# Patient Record
Sex: Male | Born: 1950 | Race: White | Hispanic: No | Marital: Married | Smoking: Never smoker
Health system: Southern US, Community
[De-identification: ages and names within clinical notes are randomized; demographics above are authoritative.]

## PROBLEM LIST (undated history)

## (undated) DIAGNOSIS — F1011 Alcohol abuse, in remission: Secondary | ICD-10-CM

## (undated) DIAGNOSIS — K3184 Gastroparesis: Secondary | ICD-10-CM

## (undated) DIAGNOSIS — K219 Gastro-esophageal reflux disease without esophagitis: Secondary | ICD-10-CM

## (undated) DIAGNOSIS — K7460 Unspecified cirrhosis of liver: Secondary | ICD-10-CM

## (undated) DIAGNOSIS — I8500 Esophageal varices without bleeding: Secondary | ICD-10-CM

## (undated) DIAGNOSIS — N289 Disorder of kidney and ureter, unspecified: Secondary | ICD-10-CM

## (undated) DIAGNOSIS — C189 Malignant neoplasm of colon, unspecified: Secondary | ICD-10-CM

## (undated) DIAGNOSIS — Z9889 Other specified postprocedural states: Secondary | ICD-10-CM

## (undated) DIAGNOSIS — K222 Esophageal obstruction: Secondary | ICD-10-CM

## (undated) DIAGNOSIS — N179 Acute kidney failure, unspecified: Secondary | ICD-10-CM

## (undated) HISTORY — DX: Malignant neoplasm of colon, unspecified: C18.9

## (undated) HISTORY — DX: Alcohol abuse, in remission: F10.11

## (undated) HISTORY — DX: Esophageal varices without bleeding: I85.00

## (undated) HISTORY — PX: COLOSTOMY: SHX63

## (undated) HISTORY — DX: Esophageal obstruction: K22.2

## (undated) HISTORY — PX: OTHER SURGICAL HISTORY: SHX169

## (undated) HISTORY — DX: Unspecified cirrhosis of liver: K74.60

## (undated) HISTORY — DX: Gastroparesis: K31.84

## (undated) HISTORY — DX: Acute kidney failure, unspecified: N17.9

## (undated) HISTORY — DX: Gastro-esophageal reflux disease without esophagitis: K21.9

## (undated) HISTORY — DX: Disorder of kidney and ureter, unspecified: N28.9

## (undated) HISTORY — DX: Other specified postprocedural states: Z98.890

---

## 2010-01-19 DIAGNOSIS — Z9889 Other specified postprocedural states: Secondary | ICD-10-CM

## 2010-01-19 DIAGNOSIS — C189 Malignant neoplasm of colon, unspecified: Secondary | ICD-10-CM

## 2010-01-19 HISTORY — DX: Malignant neoplasm of colon, unspecified: C18.9

## 2010-01-19 HISTORY — DX: Other specified postprocedural states: Z98.890

## 2010-01-19 HISTORY — PX: ESOPHAGOGASTRODUODENOSCOPY: SHX1529

## 2010-01-19 HISTORY — PX: COLONOSCOPY: SHX174

## 2010-01-29 ENCOUNTER — Emergency Department (HOSPITAL_COMMUNITY)
Admission: EM | Admit: 2010-01-29 | Discharge: 2010-01-29 | Payer: Self-pay | Source: Home / Self Care | Admitting: Emergency Medicine

## 2010-01-30 ENCOUNTER — Ambulatory Visit (HOSPITAL_COMMUNITY)
Admission: RE | Admit: 2010-01-30 | Discharge: 2010-01-30 | Payer: Self-pay | Source: Home / Self Care | Attending: Family Medicine | Admitting: Family Medicine

## 2010-02-02 ENCOUNTER — Ambulatory Visit: Payer: Self-pay | Admitting: Internal Medicine

## 2010-02-02 DIAGNOSIS — R1013 Epigastric pain: Secondary | ICD-10-CM | POA: Insufficient documentation

## 2010-02-02 DIAGNOSIS — R740 Nonspecific elevation of levels of transaminase and lactic acid dehydrogenase [LDH]: Secondary | ICD-10-CM

## 2010-02-02 DIAGNOSIS — K7030 Alcoholic cirrhosis of liver without ascites: Secondary | ICD-10-CM

## 2010-02-03 ENCOUNTER — Encounter: Payer: Self-pay | Admitting: Gastroenterology

## 2010-02-07 ENCOUNTER — Encounter: Payer: Self-pay | Admitting: Internal Medicine

## 2010-02-07 ENCOUNTER — Ambulatory Visit (HOSPITAL_COMMUNITY)
Admission: RE | Admit: 2010-02-07 | Discharge: 2010-02-07 | Payer: Self-pay | Source: Home / Self Care | Attending: Internal Medicine | Admitting: Internal Medicine

## 2010-02-08 ENCOUNTER — Ambulatory Visit: Payer: Self-pay | Admitting: Gastroenterology

## 2010-02-09 ENCOUNTER — Encounter: Payer: Self-pay | Admitting: Internal Medicine

## 2010-02-09 ENCOUNTER — Encounter: Payer: Self-pay | Admitting: Gastroenterology

## 2010-02-09 LAB — CONVERTED CEMR LAB
AFP-Tumor Marker: 9.5 ng/mL — ABNORMAL HIGH (ref 0.0–8.0)
Alkaline Phosphatase: 98 units/L (ref 39–117)
HCV Ab: NEGATIVE
INR: 1.1 (ref <1.50)
Indirect Bilirubin: 1 mg/dL — ABNORMAL HIGH (ref 0.0–0.9)
Prothrombin Time: 14.4 s (ref 11.6–15.2)
Total Bilirubin: 1.6 mg/dL — ABNORMAL HIGH (ref 0.3–1.2)
Total Protein: 6.7 g/dL (ref 6.0–8.3)

## 2010-02-15 ENCOUNTER — Encounter (INDEPENDENT_AMBULATORY_CARE_PROVIDER_SITE_OTHER): Payer: Self-pay | Admitting: *Deleted

## 2010-02-22 ENCOUNTER — Encounter (INDEPENDENT_AMBULATORY_CARE_PROVIDER_SITE_OTHER): Payer: Self-pay | Admitting: *Deleted

## 2010-02-24 ENCOUNTER — Inpatient Hospital Stay (HOSPITAL_COMMUNITY)
Admission: RE | Admit: 2010-02-24 | Discharge: 2010-03-01 | Payer: Self-pay | Source: Home / Self Care | Attending: General Surgery | Admitting: General Surgery

## 2010-02-24 ENCOUNTER — Encounter (INDEPENDENT_AMBULATORY_CARE_PROVIDER_SITE_OTHER): Payer: Self-pay | Admitting: General Surgery

## 2010-02-24 HISTORY — PX: COLON SURGERY: SHX602

## 2010-02-24 LAB — HEMOGLOBIN AND HEMATOCRIT, BLOOD
HCT: 38.4 % — ABNORMAL LOW (ref 39.0–52.0)
Hemoglobin: 13.2 g/dL (ref 13.0–17.0)

## 2010-03-03 ENCOUNTER — Inpatient Hospital Stay (HOSPITAL_COMMUNITY)
Admission: EM | Admit: 2010-03-03 | Discharge: 2010-04-07 | DRG: 329 | Disposition: A | Discharge status: Home Health | Payer: Medicaid Other | Attending: General Surgery | Admitting: General Surgery

## 2010-03-03 DIAGNOSIS — Y836 Removal of other organ (partial) (total) as the cause of abnormal reaction of the patient, or of later complication, without mention of misadventure at the time of the procedure: Secondary | ICD-10-CM | POA: Diagnosis present

## 2010-03-03 DIAGNOSIS — K7030 Alcoholic cirrhosis of liver without ascites: Secondary | ICD-10-CM | POA: Diagnosis present

## 2010-03-03 DIAGNOSIS — D631 Anemia in chronic kidney disease: Secondary | ICD-10-CM | POA: Diagnosis present

## 2010-03-03 DIAGNOSIS — E46 Unspecified protein-calorie malnutrition: Secondary | ICD-10-CM | POA: Diagnosis not present

## 2010-03-03 DIAGNOSIS — A419 Sepsis, unspecified organism: Secondary | ICD-10-CM | POA: Diagnosis present

## 2010-03-03 DIAGNOSIS — N179 Acute kidney failure, unspecified: Secondary | ICD-10-CM

## 2010-03-03 DIAGNOSIS — K632 Fistula of intestine: Secondary | ICD-10-CM | POA: Diagnosis not present

## 2010-03-03 DIAGNOSIS — J9600 Acute respiratory failure, unspecified whether with hypoxia or hypercapnia: Secondary | ICD-10-CM | POA: Diagnosis not present

## 2010-03-03 DIAGNOSIS — K929 Disease of digestive system, unspecified: Principal | ICD-10-CM | POA: Diagnosis present

## 2010-03-03 DIAGNOSIS — E876 Hypokalemia: Secondary | ICD-10-CM | POA: Diagnosis present

## 2010-03-03 DIAGNOSIS — F1020 Alcohol dependence, uncomplicated: Secondary | ICD-10-CM | POA: Diagnosis present

## 2010-03-03 HISTORY — PX: EXPLORATORY LAPAROTOMY: SUR591

## 2010-03-06 LAB — BASIC METABOLIC PANEL
BUN: 9 mg/dL (ref 6–23)
BUN: 12 mg/dL (ref 6–23)
BUN: 13 mg/dL (ref 6–23)
BUN: 16 mg/dL (ref 6–23)
BUN: 11 mg/dL (ref 6–23)
BUN: 37 mg/dL — ABNORMAL HIGH (ref 6–23)
BUN: 60 mg/dL — ABNORMAL HIGH (ref 6–23)
CO2: 28 mEq/L (ref 19–32)
CO2: 29 mEq/L (ref 19–32)
CO2: 31 mEq/L (ref 19–32)
CO2: 28 mEq/L (ref 19–32)
CO2: 26 mEq/L (ref 19–32)
CO2: 17 mEq/L — ABNORMAL LOW (ref 19–32)
CO2: 16 mEq/L — ABNORMAL LOW (ref 19–32)
Calcium: 8.1 mg/dL — ABNORMAL LOW (ref 8.4–10.5)
Calcium: 8.1 mg/dL — ABNORMAL LOW (ref 8.4–10.5)
Calcium: 8.3 mg/dL — ABNORMAL LOW (ref 8.4–10.5)
Calcium: 8 mg/dL — ABNORMAL LOW (ref 8.4–10.5)
Calcium: 9.1 mg/dL (ref 8.4–10.5)
Calcium: 7.9 mg/dL — ABNORMAL LOW (ref 8.4–10.5)
Calcium: 7.7 mg/dL — ABNORMAL LOW (ref 8.4–10.5)
Chloride: 98 mEq/L (ref 96–112)
Chloride: 102 mEq/L (ref 96–112)
Chloride: 104 mEq/L (ref 96–112)
Chloride: 107 mEq/L (ref 96–112)
Chloride: 108 mEq/L (ref 96–112)
Chloride: 113 mEq/L — ABNORMAL HIGH (ref 96–112)
Chloride: 108 mEq/L (ref 96–112)
Creatinine, Ser: 1.17 mg/dL (ref 0.4–1.5)
Creatinine, Ser: 0.93 mg/dL (ref 0.4–1.5)
Creatinine, Ser: 0.95 mg/dL (ref 0.4–1.5)
Creatinine, Ser: 0.85 mg/dL (ref 0.4–1.5)
Creatinine, Ser: 0.91 mg/dL (ref 0.4–1.5)
Creatinine, Ser: 3.56 mg/dL — ABNORMAL HIGH (ref 0.4–1.5)
Creatinine, Ser: 5.01 mg/dL — ABNORMAL HIGH (ref 0.4–1.5)
GFR calc Af Amer: >60 mL/min (ref >60)
GFR calc Af Amer: >60 mL/min (ref >60)
GFR calc Af Amer: >60 mL/min (ref >60)
GFR calc Af Amer: >60 mL/min (ref >60)
GFR calc Af Amer: >60 mL/min (ref >60)
GFR calc Af Amer: 22 mL/min — ABNORMAL LOW (ref >60)
GFR calc Af Amer: 15 mL/min — ABNORMAL LOW (ref >60)
GFR calc non Af Amer: >60 mL/min (ref >60)
GFR calc non Af Amer: >60 mL/min (ref >60)
GFR calc non Af Amer: >60 mL/min (ref >60)
GFR calc non Af Amer: >60 mL/min (ref >60)
GFR calc non Af Amer: >60 mL/min (ref >60)
GFR calc non Af Amer: 18 mL/min — ABNORMAL LOW (ref >60)
GFR calc non Af Amer: 12 mL/min — ABNORMAL LOW (ref >60)
Glucose, Bld: 159 mg/dL — ABNORMAL HIGH (ref 70–99)
Glucose, Bld: 151 mg/dL — ABNORMAL HIGH (ref 70–99)
Glucose, Bld: 114 mg/dL — ABNORMAL HIGH (ref 70–99)
Glucose, Bld: 88 mg/dL (ref 70–99)
Glucose, Bld: 94 mg/dL (ref 70–99)
Glucose, Bld: 92 mg/dL (ref 70–99)
Glucose, Bld: 101 mg/dL — ABNORMAL HIGH (ref 70–99)
Potassium: 5.4 mEq/L — ABNORMAL HIGH (ref 3.5–5.1)
Potassium: 3.8 mEq/L (ref 3.5–5.1)
Potassium: 3.5 mEq/L (ref 3.5–5.1)
Potassium: 2.9 mEq/L — ABNORMAL LOW (ref 3.5–5.1)
Potassium: 3.7 mEq/L (ref 3.5–5.1)
Potassium: 2.7 mEq/L — CL (ref 3.5–5.1)
Potassium: 3.1 mEq/L — ABNORMAL LOW (ref 3.5–5.1)
Sodium: 136 mEq/L (ref 135–145)
Sodium: 137 mEq/L (ref 135–145)
Sodium: 141 mEq/L (ref 135–145)
Sodium: 141 mEq/L (ref 135–145)
Sodium: 139 mEq/L (ref 135–145)
Sodium: 138 mEq/L (ref 135–145)
Sodium: 135 mEq/L (ref 135–145)

## 2010-03-06 LAB — GLUCOSE, CAPILLARY: Glucose-Capillary: 164 mg/dL — ABNORMAL HIGH (ref 70–99)

## 2010-03-06 LAB — COMPREHENSIVE METABOLIC PANEL
ALT: 20 U/L (ref 0–53)
ALT: 21 U/L (ref 0–53)
AST: 28 U/L (ref 0–37)
AST: 37 U/L (ref 0–37)
AST: 31 U/L (ref 0–37)
Albumin: 2.8 g/dL — ABNORMAL LOW (ref 3.5–5.2)
Albumin: 1.7 g/dL — ABNORMAL LOW (ref 3.5–5.2)
Albumin: 1.4 g/dL — ABNORMAL LOW (ref 3.5–5.2)
Alkaline Phosphatase: 66 U/L (ref 39–117)
Alkaline Phosphatase: 52 U/L (ref 39–117)
Alkaline Phosphatase: 108 U/L (ref 39–117)
BUN: 31 mg/dL — ABNORMAL HIGH (ref 6–23)
BUN: 30 mg/dL — ABNORMAL HIGH (ref 6–23)
BUN: 72 mg/dL — ABNORMAL HIGH (ref 6–23)
CO2: 13 mEq/L — ABNORMAL LOW (ref 19–32)
CO2: 16 mEq/L — ABNORMAL LOW (ref 19–32)
CO2: 18 mEq/L — ABNORMAL LOW (ref 19–32)
Calcium: 10.4 mg/dL (ref 8.4–10.5)
Calcium: 8.5 mg/dL (ref 8.4–10.5)
Calcium: 7.8 mg/dL — ABNORMAL LOW (ref 8.4–10.5)
Chloride: 109 mEq/L (ref 96–112)
Chloride: 108 mEq/L (ref 96–112)
Chloride: 110 mEq/L (ref 96–112)
Creatinine, Ser: 3.59 mg/dL — ABNORMAL HIGH (ref 0.4–1.5)
Creatinine, Ser: 2.7 mg/dL — ABNORMAL HIGH (ref 0.4–1.5)
GFR calc Af Amer: 21 mL/min — ABNORMAL LOW (ref >60)
GFR calc Af Amer: 30 mL/min — ABNORMAL LOW (ref >60)
GFR calc Af Amer: 13 mL/min — ABNORMAL LOW (ref >60)
GFR calc non Af Amer: 18 mL/min — ABNORMAL LOW (ref >60)
GFR calc non Af Amer: 24 mL/min — ABNORMAL LOW (ref >60)
GFR calc non Af Amer: 11 mL/min — ABNORMAL LOW (ref >60)
Glucose, Bld: 144 mg/dL — ABNORMAL HIGH (ref 70–99)
Glucose, Bld: 90 mg/dL (ref 70–99)
Glucose, Bld: 95 mg/dL (ref 70–99)
Potassium: 3.1 mEq/L — ABNORMAL LOW (ref 3.5–5.1)
Potassium: 3 mEq/L — ABNORMAL LOW (ref 3.5–5.1)
Potassium: 3.6 mEq/L (ref 3.5–5.1)
Sodium: 136 mEq/L (ref 135–145)
Sodium: 135 mEq/L (ref 135–145)
Sodium: 136 mEq/L (ref 135–145)
Total Bilirubin: 1.4 mg/dL — ABNORMAL HIGH (ref 0.3–1.2)
Total Bilirubin: 1.4 mg/dL — ABNORMAL HIGH (ref 0.3–1.2)
Total Bilirubin: 1.6 mg/dL — ABNORMAL HIGH (ref 0.3–1.2)
Total Protein: 6.4 g/dL (ref 6.0–8.3)
Total Protein: 3.9 g/dL — ABNORMAL LOW (ref 6.0–8.3)
Total Protein: 4.1 g/dL — ABNORMAL LOW (ref 6.0–8.3)

## 2010-03-06 LAB — CBC
HCT: 38.6 % — ABNORMAL LOW (ref 39.0–52.0)
HCT: 35.2 % — ABNORMAL LOW (ref 39.0–52.0)
HCT: 32.1 % — ABNORMAL LOW (ref 39.0–52.0)
HCT: 28.7 % — ABNORMAL LOW (ref 39.0–52.0)
HCT: 38.7 % — ABNORMAL LOW (ref 39.0–52.0)
HCT: 30 % — ABNORMAL LOW (ref 39.0–52.0)
HCT: 27.8 % — ABNORMAL LOW (ref 39.0–52.0)
HCT: 22.6 % — ABNORMAL LOW (ref 39.0–52.0)
HCT: 27.4 % — ABNORMAL LOW (ref 39.0–52.0)
Hemoglobin: 13.3 g/dL (ref 13.0–17.0)
Hemoglobin: 11.9 g/dL — ABNORMAL LOW (ref 13.0–17.0)
Hemoglobin: 10.9 g/dL — ABNORMAL LOW (ref 13.0–17.0)
Hemoglobin: 9.9 g/dL — ABNORMAL LOW (ref 13.0–17.0)
Hemoglobin: 13.4 g/dL (ref 13.0–17.0)
Hemoglobin: 10.8 g/dL — ABNORMAL LOW (ref 13.0–17.0)
Hemoglobin: 10.3 g/dL — ABNORMAL LOW (ref 13.0–17.0)
Hemoglobin: 8.2 g/dL — ABNORMAL LOW (ref 13.0–17.0)
Hemoglobin: 9.9 g/dL — ABNORMAL LOW (ref 13.0–17.0)
MCH: 35 pg — ABNORMAL HIGH (ref 26.0–34.0)
MCH: 34.2 pg — ABNORMAL HIGH (ref 26.0–34.0)
MCH: 34.2 pg — ABNORMAL HIGH (ref 26.0–34.0)
MCH: 34.3 pg — ABNORMAL HIGH (ref 26.0–34.0)
MCH: 33.5 pg (ref 26.0–34.0)
MCH: 34.4 pg — ABNORMAL HIGH (ref 26.0–34.0)
MCH: 34.7 pg — ABNORMAL HIGH (ref 26.0–34.0)
MCH: 34 pg (ref 26.0–34.0)
MCH: 33.2 pg (ref 26.0–34.0)
MCHC: 34.5 g/dL (ref 30.0–36.0)
MCHC: 33.8 g/dL (ref 30.0–36.0)
MCHC: 34 g/dL (ref 30.0–36.0)
MCHC: 34.5 g/dL (ref 30.0–36.0)
MCHC: 34.6 g/dL (ref 30.0–36.0)
MCHC: 36 g/dL (ref 30.0–36.0)
MCHC: 37.1 g/dL — ABNORMAL HIGH (ref 30.0–36.0)
MCHC: 36.1 g/dL — ABNORMAL HIGH (ref 30.0–36.0)
MCV: 101.6 fL — ABNORMAL HIGH (ref 78.0–100.0)
MCV: 101.1 fL — ABNORMAL HIGH (ref 78.0–100.0)
MCV: 100.6 fL — ABNORMAL HIGH (ref 78.0–100.0)
MCV: 99.3 fL (ref 78.0–100.0)
MCV: 96.8 fL (ref 78.0–100.0)
MCV: 95.5 fL (ref 78.0–100.0)
MCV: 93.6 fL (ref 78.0–100.0)
MCV: 93.8 fL (ref 78.0–100.0)
MCV: 91.9 fL (ref 78.0–100.0)
Platelets: 179 10*3/uL (ref 150–400)
Platelets: 130 10*3/uL — ABNORMAL LOW (ref 150–400)
Platelets: 112 10*3/uL — ABNORMAL LOW (ref 150–400)
Platelets: 112 10*3/uL — ABNORMAL LOW (ref 150–400)
Platelets: 401 10*3/uL — ABNORMAL HIGH (ref 150–400)
Platelets: 264 10*3/uL (ref 150–400)
Platelets: 316 10*3/uL (ref 150–400)
Platelets: 195 10*3/uL (ref 150–400)
RBC: 3.8 MIL/uL — ABNORMAL LOW (ref 4.22–5.81)
RBC: 3.48 MIL/uL — ABNORMAL LOW (ref 4.22–5.81)
RBC: 3.19 MIL/uL — ABNORMAL LOW (ref 4.22–5.81)
RBC: 2.89 MIL/uL — ABNORMAL LOW (ref 4.22–5.81)
RBC: 4 MIL/uL — ABNORMAL LOW (ref 4.22–5.81)
RBC: 3.14 MIL/uL — ABNORMAL LOW (ref 4.22–5.81)
RBC: 2.97 MIL/uL — ABNORMAL LOW (ref 4.22–5.81)
RBC: 2.41 MIL/uL — ABNORMAL LOW (ref 4.22–5.81)
RBC: 2.98 MIL/uL — ABNORMAL LOW (ref 4.22–5.81)
RDW: 16.7 % — ABNORMAL HIGH (ref 11.5–15.5)
RDW: 16.4 % — ABNORMAL HIGH (ref 11.5–15.5)
RDW: 16.4 % — ABNORMAL HIGH (ref 11.5–15.5)
RDW: 15.9 % — ABNORMAL HIGH (ref 11.5–15.5)
RDW: 16.1 % — ABNORMAL HIGH (ref 11.5–15.5)
RDW: 16.1 % — ABNORMAL HIGH (ref 11.5–15.5)
RDW: 16.1 % — ABNORMAL HIGH (ref 11.5–15.5)
RDW: 16.8 % — ABNORMAL HIGH (ref 11.5–15.5)
WBC: 18 10*3/uL — ABNORMAL HIGH (ref 4.0–10.5)
WBC: 12.5 10*3/uL — ABNORMAL HIGH (ref 4.0–10.5)
WBC: 3.5 10*3/uL — ABNORMAL LOW (ref 4.0–10.5)
WBC: 3.5 10*3/uL — ABNORMAL LOW (ref 4.0–10.5)
WBC: 25.2 10*3/uL — ABNORMAL HIGH (ref 4.0–10.5)
WBC: 11 10*3/uL — ABNORMAL HIGH (ref 4.0–10.5)
WBC: 21.6 10*3/uL — ABNORMAL HIGH (ref 4.0–10.5)
WBC: 13.8 10*3/uL — ABNORMAL HIGH (ref 4.0–10.5)
WBC: 13.7 10*3/uL — ABNORMAL HIGH (ref 4.0–10.5)

## 2010-03-06 LAB — DIFFERENTIAL
Basophils Absolute: 0 10*3/uL (ref 0.0–0.1)
Basophils Absolute: 0 10*3/uL (ref 0.0–0.1)
Basophils Absolute: 0 10*3/uL (ref 0.0–0.1)
Basophils Absolute: 0 10*3/uL (ref 0.0–0.1)
Basophils Absolute: 0 10*3/uL (ref 0.0–0.1)
Basophils Absolute: 0 10*3/uL (ref 0.0–0.1)
Basophils Absolute: 0 10*3/uL (ref 0.0–0.1)
Basophils Absolute: 0 10*3/uL (ref 0.0–0.1)
Basophils Absolute: 0 10*3/uL (ref 0.0–0.1)
Basophils Relative: 0 % (ref 0–1)
Basophils Relative: 0 % (ref 0–1)
Basophils Relative: 0 % (ref 0–1)
Basophils Relative: 0 % (ref 0–1)
Basophils Relative: 0 % (ref 0–1)
Basophils Relative: 0 % (ref 0–1)
Basophils Relative: 0 % (ref 0–1)
Basophils Relative: 0 % (ref 0–1)
Basophils Relative: 0 % (ref 0–1)
Eosinophils Absolute: 0 10*3/uL (ref 0.0–0.7)
Eosinophils Absolute: 0 10*3/uL (ref 0.0–0.7)
Eosinophils Absolute: 0 10*3/uL (ref 0.0–0.7)
Eosinophils Absolute: 0 10*3/uL (ref 0.0–0.7)
Eosinophils Absolute: 0 10*3/uL (ref 0.0–0.7)
Eosinophils Absolute: 0 10*3/uL (ref 0.0–0.7)
Eosinophils Absolute: 0 10*3/uL (ref 0.0–0.7)
Eosinophils Absolute: 0 10*3/uL (ref 0.0–0.7)
Eosinophils Absolute: 0 10*3/uL (ref 0.0–0.7)
Eosinophils Relative: 0 % (ref 0–5)
Eosinophils Relative: 0 % (ref 0–5)
Eosinophils Relative: 0 % (ref 0–5)
Eosinophils Relative: 1 % (ref 0–5)
Eosinophils Relative: 0 % (ref 0–5)
Eosinophils Relative: 0 % (ref 0–5)
Eosinophils Relative: 0 % (ref 0–5)
Eosinophils Relative: 0 % (ref 0–5)
Eosinophils Relative: 0 % (ref 0–5)
Lymphocytes Relative: 7 % — ABNORMAL LOW (ref 12–46)
Lymphocytes Relative: 5 % — ABNORMAL LOW (ref 12–46)
Lymphocytes Relative: 12 % (ref 12–46)
Lymphocytes Relative: 19 % (ref 12–46)
Lymphocytes Relative: 4 % — ABNORMAL LOW (ref 12–46)
Lymphocytes Relative: 8 % — ABNORMAL LOW (ref 12–46)
Lymphocytes Relative: 5 % — ABNORMAL LOW (ref 12–46)
Lymphocytes Relative: 5 % — ABNORMAL LOW (ref 12–46)
Lymphocytes Relative: 4 % — ABNORMAL LOW (ref 12–46)
Lymphs Abs: 1.2 10*3/uL (ref 0.7–4.0)
Lymphs Abs: 0.7 10*3/uL (ref 0.7–4.0)
Lymphs Abs: 0.4 10*3/uL — ABNORMAL LOW (ref 0.7–4.0)
Lymphs Abs: 0.7 10*3/uL (ref 0.7–4.0)
Lymphs Abs: 1 10*3/uL (ref 0.7–4.0)
Lymphs Abs: 0.9 10*3/uL (ref 0.7–4.0)
Lymphs Abs: 1 10*3/uL (ref 0.7–4.0)
Lymphs Abs: 0.8 10*3/uL (ref 0.7–4.0)
Lymphs Abs: 0.5 10*3/uL — ABNORMAL LOW (ref 0.7–4.0)
Monocytes Absolute: 1.2 10*3/uL — ABNORMAL HIGH (ref 0.1–1.0)
Monocytes Absolute: 0.9 10*3/uL (ref 0.1–1.0)
Monocytes Absolute: 0.6 10*3/uL (ref 0.1–1.0)
Monocytes Absolute: 0.6 10*3/uL (ref 0.1–1.0)
Monocytes Absolute: 1.5 10*3/uL — ABNORMAL HIGH (ref 0.1–1.0)
Monocytes Absolute: 0.3 10*3/uL (ref 0.1–1.0)
Monocytes Absolute: 0.9 10*3/uL (ref 0.1–1.0)
Monocytes Absolute: 0.5 10*3/uL (ref 0.1–1.0)
Monocytes Absolute: 0.6 10*3/uL (ref 0.1–1.0)
Monocytes Relative: 7 % (ref 3–12)
Monocytes Relative: 7 % (ref 3–12)
Monocytes Relative: 16 % — ABNORMAL HIGH (ref 3–12)
Monocytes Relative: 16 % — ABNORMAL HIGH (ref 3–12)
Monocytes Relative: 6 % (ref 3–12)
Monocytes Relative: 2 % — ABNORMAL LOW (ref 3–12)
Monocytes Relative: 4 % (ref 3–12)
Monocytes Relative: 4 % (ref 3–12)
Monocytes Relative: 4 % (ref 3–12)
Neutro Abs: 15.6 10*3/uL — ABNORMAL HIGH (ref 1.7–7.7)
Neutro Abs: 10.9 10*3/uL — ABNORMAL HIGH (ref 1.7–7.7)
Neutro Abs: 2.5 10*3/uL (ref 1.7–7.7)
Neutro Abs: 2.3 10*3/uL (ref 1.7–7.7)
Neutro Abs: 22.7 10*3/uL — ABNORMAL HIGH (ref 1.7–7.7)
Neutro Abs: 9.8 10*3/uL — ABNORMAL HIGH (ref 1.7–7.7)
Neutro Abs: 19.7 10*3/uL — ABNORMAL HIGH (ref 1.7–7.7)
Neutro Abs: 12.5 10*3/uL — ABNORMAL HIGH (ref 1.7–7.7)
Neutro Abs: 12.7 10*3/uL — ABNORMAL HIGH (ref 1.7–7.7)
Neutrophils Relative %: 87 % — ABNORMAL HIGH (ref 43–77)
Neutrophils Relative %: 87 % — ABNORMAL HIGH (ref 43–77)
Neutrophils Relative %: 72 % (ref 43–77)
Neutrophils Relative %: 65 % (ref 43–77)
Neutrophils Relative %: 90 % — ABNORMAL HIGH (ref 43–77)
Neutrophils Relative %: 89 % — ABNORMAL HIGH (ref 43–77)
Neutrophils Relative %: 91 % — ABNORMAL HIGH (ref 43–77)
Neutrophils Relative %: 91 % — ABNORMAL HIGH (ref 43–77)
Neutrophils Relative %: 92 % — ABNORMAL HIGH (ref 43–77)

## 2010-03-06 LAB — MAGNESIUM
Magnesium: 1.3 mg/dL — ABNORMAL LOW (ref 1.5–2.5)
Magnesium: 1.2 mg/dL — ABNORMAL LOW (ref 1.5–2.5)
Magnesium: 2.3 mg/dL (ref 1.5–2.5)
Magnesium: 2.1 mg/dL (ref 1.5–2.5)
Magnesium: 1.5 mg/dL (ref 1.5–2.5)
Magnesium: 1.5 mg/dL (ref 1.5–2.5)

## 2010-03-06 LAB — BLOOD GAS, ARTERIAL
Acid-base deficit: 11.7 mmol/L — ABNORMAL HIGH (ref 0.0–2.0)
Acid-base deficit: 10 mmol/L — ABNORMAL HIGH (ref 0.0–2.0)
Acid-base deficit: 8.4 mmol/L — ABNORMAL HIGH (ref 0.0–2.0)
Bicarbonate: 13.3 mEq/L — ABNORMAL LOW (ref 20.0–24.0)
Bicarbonate: 13.9 mEq/L — ABNORMAL LOW (ref 20.0–24.0)
FIO2: 80 %
FIO2: 50 %
FIO2: 0.5 %
MECHVT: 650 mL
MECHVT: 650 mL
MECHVT: 650 mL
O2 Saturation: 100 %
O2 Saturation: 99.1 %
PEEP: 5 cmH2O
PEEP: 5 cmH2O
Patient temperature: 37
Patient temperature: 37
RATE: 12 resp/min
RATE: 12 resp/min
TCO2: 12.3 mmol/L (ref 0–100)
TCO2: 11.8 mmol/L (ref 0–100)
TCO2: 14.3 mmol/L (ref 0–100)
pCO2 arterial: 27.2 mmHg — ABNORMAL LOW (ref 35.0–45.0)
pCO2 arterial: 22.8 mmHg — ABNORMAL LOW (ref 35.0–45.0)
pCO2 arterial: 24.2 mmHg — ABNORMAL LOW (ref 35.0–45.0)
pH, Arterial: 7.311 — ABNORMAL LOW (ref 7.350–7.450)
pH, Arterial: 7.4 (ref 7.350–7.450)
pO2, Arterial: 226 mmHg — ABNORMAL HIGH (ref 80.0–100.0)
pO2, Arterial: 130 mmHg — ABNORMAL HIGH (ref 80.0–100.0)
pO2, Arterial: 145 mmHg — ABNORMAL HIGH (ref 80.0–100.0)

## 2010-03-06 LAB — CROSSMATCH
ABO/RH(D): B POS
Antibody Screen: NEGATIVE
Unit division: 0
Unit division: 0

## 2010-03-06 LAB — PHOSPHORUS
Phosphorus: 4.9 mg/dL — ABNORMAL HIGH (ref 2.3–4.6)
Phosphorus: 3 mg/dL (ref 2.3–4.6)
Phosphorus: 2.2 mg/dL — ABNORMAL LOW (ref 2.3–4.6)
Phosphorus: 2.7 mg/dL (ref 2.3–4.6)
Phosphorus: 3.7 mg/dL (ref 2.3–4.6)
Phosphorus: 6.3 mg/dL — ABNORMAL HIGH (ref 2.3–4.6)
Phosphorus: 4.3 mg/dL (ref 2.3–4.6)
Phosphorus: 5.1 mg/dL — ABNORMAL HIGH (ref 2.3–4.6)

## 2010-03-06 LAB — POCT CARDIAC MARKERS
CKMB, poc: <1 ng/mL — ABNORMAL LOW (ref 1.0–8.0)
Myoglobin, poc: >500 ng/mL (ref 12–200)
Troponin i, poc: <0.05 ng/mL (ref 0.00–0.09)

## 2010-03-06 LAB — HEMOGLOBIN AND HEMATOCRIT, BLOOD
HCT: 32.3 % — ABNORMAL LOW (ref 39.0–52.0)
HCT: 34.4 % — ABNORMAL LOW (ref 39.0–52.0)
Hemoglobin: 11.2 g/dL — ABNORMAL LOW (ref 13.0–17.0)
Hemoglobin: 12 g/dL — ABNORMAL LOW (ref 13.0–17.0)

## 2010-03-06 LAB — URINALYSIS, ROUTINE W REFLEX MICROSCOPIC
Hgb urine dipstick: NEGATIVE
Leukocytes, UA: NEGATIVE
Nitrite: POSITIVE — AB
Protein, ur: 30 mg/dL — AB
Specific Gravity, Urine: >1.03 — ABNORMAL HIGH (ref 1.005–1.030)
Urine Glucose, Fasting: NEGATIVE mg/dL
Urobilinogen, UA: 1 mg/dL (ref 0.0–1.0)
pH: 5 (ref 5.0–8.0)

## 2010-03-06 LAB — POTASSIUM
Potassium: 2.9 mEq/L — ABNORMAL LOW (ref 3.5–5.1)
Potassium: 2.9 mEq/L — ABNORMAL LOW (ref 3.5–5.1)

## 2010-03-06 LAB — ALBUMIN
Albumin: 3.2 g/dL — ABNORMAL LOW (ref 3.5–5.2)
Albumin: 3 g/dL — ABNORMAL LOW (ref 3.5–5.2)
Albumin: 2.6 g/dL — ABNORMAL LOW (ref 3.5–5.2)
Albumin: 2.5 g/dL — ABNORMAL LOW (ref 3.5–5.2)
Albumin: 1.5 g/dL — ABNORMAL LOW (ref 3.5–5.2)

## 2010-03-06 LAB — URINE MICROSCOPIC-ADD ON

## 2010-03-06 LAB — MRSA PCR SCREENING: MRSA by PCR: NEGATIVE

## 2010-03-06 LAB — LIPASE, BLOOD: Lipase: 18 U/L (ref 11–59)

## 2010-03-06 LAB — PREPARE RBC (CROSSMATCH)

## 2010-03-06 LAB — LACTIC ACID, PLASMA: Lactic Acid, Venous: 2.9 mmol/L — ABNORMAL HIGH (ref 0.5–2.2)

## 2010-03-07 ENCOUNTER — Encounter (INDEPENDENT_AMBULATORY_CARE_PROVIDER_SITE_OTHER): Payer: Self-pay | Admitting: General Surgery

## 2010-03-08 LAB — BLOOD GAS, ARTERIAL
Acid-base deficit: 9.2 mmol/L — ABNORMAL HIGH (ref 0.0–2.0)
Acid-base deficit: 8.6 mmol/L — ABNORMAL HIGH (ref 0.0–2.0)
Bicarbonate: 15.1 mEq/L — ABNORMAL LOW (ref 20.0–24.0)
Bicarbonate: 15.6 mEq/L — ABNORMAL LOW (ref 20.0–24.0)
FIO2: 0.4 %
FIO2: 40 %
MECHVT: 650 mL
MECHVT: 650 mL
O2 Saturation: 99.1 %
O2 Saturation: 98.8 %
PEEP: 5 cmH2O
PEEP: 5 cmH2O
Patient temperature: 37
RATE: 12 resp/min
RATE: 12 resp/min
TCO2: 14 mmol/L (ref 0–100)
TCO2: 14.6 mmol/L (ref 0–100)
pCO2 arterial: 27.4 mmHg — ABNORMAL LOW (ref 35.0–45.0)
pCO2 arterial: 27.3 mmHg — ABNORMAL LOW (ref 35.0–45.0)
pH, Arterial: 7.361 (ref 7.350–7.450)
pH, Arterial: 7.376 (ref 7.350–7.450)
pO2, Arterial: 125 mmHg — ABNORMAL HIGH (ref 80.0–100.0)
pO2, Arterial: 128 mmHg — ABNORMAL HIGH (ref 80.0–100.0)

## 2010-03-08 LAB — COMPREHENSIVE METABOLIC PANEL
ALT: 14 U/L (ref 0–53)
AST: 22 U/L (ref 0–37)
Albumin: 1.5 g/dL — ABNORMAL LOW (ref 3.5–5.2)
Alkaline Phosphatase: 109 U/L (ref 39–117)
BUN: 88 mg/dL — ABNORMAL HIGH (ref 6–23)
CO2: 18 mEq/L — ABNORMAL LOW (ref 19–32)
Calcium: 8.2 mg/dL — ABNORMAL LOW (ref 8.4–10.5)
Chloride: 110 mEq/L (ref 96–112)
Creatinine, Ser: 5.86 mg/dL — ABNORMAL HIGH (ref 0.4–1.5)
GFR calc Af Amer: 12 mL/min — ABNORMAL LOW (ref >60)
GFR calc non Af Amer: 10 mL/min — ABNORMAL LOW (ref >60)
Glucose, Bld: 106 mg/dL — ABNORMAL HIGH (ref 70–99)
Potassium: 2.9 mEq/L — ABNORMAL LOW (ref 3.5–5.1)
Sodium: 138 mEq/L (ref 135–145)
Total Bilirubin: 0.9 mg/dL (ref 0.3–1.2)
Total Protein: 4.3 g/dL — ABNORMAL LOW (ref 6.0–8.3)

## 2010-03-08 LAB — DIFFERENTIAL
Basophils Absolute: 0 10*3/uL (ref 0.0–0.1)
Basophils Absolute: 0 10*3/uL (ref 0.0–0.1)
Basophils Relative: 0 % (ref 0–1)
Basophils Relative: 0 % (ref 0–1)
Eosinophils Absolute: 0 10*3/uL (ref 0.0–0.7)
Eosinophils Absolute: 0 10*3/uL (ref 0.0–0.7)
Eosinophils Relative: 0 % (ref 0–5)
Eosinophils Relative: 1 % (ref 0–5)
Lymphocytes Relative: 5 % — ABNORMAL LOW (ref 12–46)
Lymphocytes Relative: 7 % — ABNORMAL LOW (ref 12–46)
Lymphs Abs: 0.6 10*3/uL — ABNORMAL LOW (ref 0.7–4.0)
Lymphs Abs: 0.5 10*3/uL — ABNORMAL LOW (ref 0.7–4.0)
Monocytes Absolute: 0.5 10*3/uL (ref 0.1–1.0)
Monocytes Absolute: 0.4 10*3/uL (ref 0.1–1.0)
Monocytes Relative: 4 % (ref 3–12)
Monocytes Relative: 6 % (ref 3–12)
Neutro Abs: 11.8 10*3/uL — ABNORMAL HIGH (ref 1.7–7.7)
Neutro Abs: 5.6 10*3/uL (ref 1.7–7.7)
Neutrophils Relative %: 91 % — ABNORMAL HIGH (ref 43–77)
Neutrophils Relative %: 87 % — ABNORMAL HIGH (ref 43–77)

## 2010-03-08 LAB — PHOSPHORUS
Phosphorus: 6.8 mg/dL — ABNORMAL HIGH (ref 2.3–4.6)
Phosphorus: 6.1 mg/dL — ABNORMAL HIGH (ref 2.3–4.6)

## 2010-03-08 LAB — CBC
HCT: 27.1 % — ABNORMAL LOW (ref 39.0–52.0)
HCT: 26.6 % — ABNORMAL LOW (ref 39.0–52.0)
Hemoglobin: 9.6 g/dL — ABNORMAL LOW (ref 13.0–17.0)
Hemoglobin: 9.2 g/dL — ABNORMAL LOW (ref 13.0–17.0)
MCH: 32 pg (ref 26.0–34.0)
MCH: 31.6 pg (ref 26.0–34.0)
MCHC: 35.4 g/dL (ref 30.0–36.0)
MCHC: 34.6 g/dL (ref 30.0–36.0)
MCV: 90.3 fL (ref 78.0–100.0)
MCV: 91.4 fL (ref 78.0–100.0)
Platelets: 209 10*3/uL (ref 150–400)
Platelets: 201 10*3/uL (ref 150–400)
RBC: 3 MIL/uL — ABNORMAL LOW (ref 4.22–5.81)
RBC: 2.91 MIL/uL — ABNORMAL LOW (ref 4.22–5.81)
RDW: 16.7 % — ABNORMAL HIGH (ref 11.5–15.5)
RDW: 16.5 % — ABNORMAL HIGH (ref 11.5–15.5)
WBC: 13 10*3/uL — ABNORMAL HIGH (ref 4.0–10.5)
WBC: 6.4 10*3/uL (ref 4.0–10.5)

## 2010-03-08 LAB — PREALBUMIN: Prealbumin: 5 mg/dL — ABNORMAL LOW (ref 17.0–34.0)

## 2010-03-08 LAB — GLUCOSE, CAPILLARY
Glucose-Capillary: 108 mg/dL — ABNORMAL HIGH (ref 70–99)
Glucose-Capillary: 120 mg/dL — ABNORMAL HIGH (ref 70–99)
Glucose-Capillary: 121 mg/dL — ABNORMAL HIGH (ref 70–99)

## 2010-03-08 LAB — TYPE AND SCREEN
ABO/RH(D): B POS
Antibody Screen: NEGATIVE
Unit division: 0
Unit division: 0

## 2010-03-08 LAB — POTASSIUM: Potassium: 3.2 mEq/L — ABNORMAL LOW (ref 3.5–5.1)

## 2010-03-08 LAB — CHOLESTEROL, TOTAL: Cholesterol: 85 mg/dL (ref 0–200)

## 2010-03-08 LAB — NA AND K (SODIUM & POTASSIUM), RAND UR
Potassium Urine: 16 mEq/L
Sodium, Ur: 57 mEq/L

## 2010-03-08 LAB — TRIGLYCERIDES: Triglycerides: 198 mg/dL — ABNORMAL HIGH (ref <150)

## 2010-03-08 LAB — BASIC METABOLIC PANEL
BUN: 77 mg/dL — ABNORMAL HIGH (ref 6–23)
CO2: 17 mEq/L — ABNORMAL LOW (ref 19–32)
Calcium: 7.9 mg/dL — ABNORMAL LOW (ref 8.4–10.5)
Chloride: 111 mEq/L (ref 96–112)
Creatinine, Ser: 5.64 mg/dL — ABNORMAL HIGH (ref 0.4–1.5)
GFR calc Af Amer: 13 mL/min — ABNORMAL LOW (ref >60)
GFR calc non Af Amer: 10 mL/min — ABNORMAL LOW (ref >60)
Glucose, Bld: 82 mg/dL (ref 70–99)
Potassium: 3.4 mEq/L — ABNORMAL LOW (ref 3.5–5.1)
Sodium: 139 mEq/L (ref 135–145)

## 2010-03-08 LAB — MAGNESIUM
Magnesium: 1.9 mg/dL (ref 1.5–2.5)
Magnesium: 1.9 mg/dL (ref 1.5–2.5)

## 2010-03-13 LAB — BLOOD GAS, ARTERIAL
Acid-base deficit: 10.6 mmol/L — ABNORMAL HIGH (ref 0.0–2.0)
Bicarbonate: 14.1 mEq/L — ABNORMAL LOW (ref 20.0–24.0)
FIO2: 40 %
MECHVT: 650 mL
O2 Saturation: 98.9 %
PEEP: 5 cmH2O
Patient temperature: 37
RATE: 12 resp/min
TCO2: 12.4 mmol/L (ref 0–100)
pCO2 arterial: 27 mmHg — ABNORMAL LOW (ref 35.0–45.0)
pH, Arterial: 7.338 — ABNORMAL LOW (ref 7.350–7.450)
pO2, Arterial: 143 mmHg — ABNORMAL HIGH (ref 80.0–100.0)

## 2010-03-13 LAB — DIFFERENTIAL
Basophils Absolute: 0 10*3/uL (ref 0.0–0.1)
Basophils Absolute: 0 10*3/uL (ref 0.0–0.1)
Basophils Absolute: 0 10*3/uL (ref 0.0–0.1)
Basophils Relative: 0 % (ref 0–1)
Basophils Relative: 0 % (ref 0–1)
Eosinophils Absolute: 0 10*3/uL (ref 0.0–0.7)
Eosinophils Absolute: 0 10*3/uL (ref 0.0–0.7)
Eosinophils Relative: 0 % (ref 0–5)
Eosinophils Relative: 1 % (ref 0–5)
Lymphocytes Relative: 8 % — ABNORMAL LOW (ref 12–46)
Lymphocytes Relative: 7 % — ABNORMAL LOW (ref 12–46)
Lymphs Abs: 0.5 10*3/uL — ABNORMAL LOW (ref 0.7–4.0)
Monocytes Absolute: 0.4 10*3/uL (ref 0.1–1.0)
Monocytes Absolute: 0.4 10*3/uL (ref 0.1–1.0)
Monocytes Relative: 5 % (ref 3–12)
Monocytes Relative: 5 % (ref 3–12)
Neutro Abs: 5.8 10*3/uL (ref 1.7–7.7)
Neutro Abs: 6.5 10*3/uL (ref 1.7–7.7)
Neutro Abs: 7.2 10*3/uL (ref 1.7–7.7)
Neutrophils Relative %: 86 % — ABNORMAL HIGH (ref 43–77)

## 2010-03-13 LAB — COMPREHENSIVE METABOLIC PANEL
ALT: 12 U/L (ref 0–53)
ALT: 9 U/L (ref 0–53)
AST: 23 U/L (ref 0–37)
AST: 24 U/L (ref 0–37)
Albumin: 2 g/dL — ABNORMAL LOW (ref 3.5–5.2)
Alkaline Phosphatase: 180 U/L — ABNORMAL HIGH (ref 39–117)
Alkaline Phosphatase: 204 U/L — ABNORMAL HIGH (ref 39–117)
BUN: 90 mg/dL — ABNORMAL HIGH (ref 6–23)
CO2: 15 mEq/L — ABNORMAL LOW (ref 19–32)
Calcium: 8.5 mg/dL (ref 8.4–10.5)
Chloride: 112 mEq/L (ref 96–112)
Creatinine, Ser: 5.67 mg/dL — ABNORMAL HIGH (ref 0.4–1.5)
GFR calc Af Amer: 13 mL/min — ABNORMAL LOW (ref >60)
GFR calc Af Amer: 14 mL/min — ABNORMAL LOW (ref >60)
GFR calc non Af Amer: 10 mL/min — ABNORMAL LOW (ref >60)
Glucose, Bld: 110 mg/dL — ABNORMAL HIGH (ref 70–99)
Glucose, Bld: 108 mg/dL — ABNORMAL HIGH (ref 70–99)
Potassium: 3.4 mEq/L — ABNORMAL LOW (ref 3.5–5.1)
Potassium: 3.6 mEq/L (ref 3.5–5.1)
Sodium: 139 mEq/L (ref 135–145)
Sodium: 136 mEq/L (ref 135–145)
Total Bilirubin: 0.7 mg/dL (ref 0.3–1.2)
Total Protein: 5.1 g/dL — ABNORMAL LOW (ref 6.0–8.3)
Total Protein: 5.1 g/dL — ABNORMAL LOW (ref 6.0–8.3)

## 2010-03-13 LAB — GLUCOSE, CAPILLARY
Glucose-Capillary: 110 mg/dL — ABNORMAL HIGH (ref 70–99)
Glucose-Capillary: 121 mg/dL — ABNORMAL HIGH (ref 70–99)
Glucose-Capillary: 128 mg/dL — ABNORMAL HIGH (ref 70–99)
Glucose-Capillary: 104 mg/dL — ABNORMAL HIGH (ref 70–99)
Glucose-Capillary: 117 mg/dL — ABNORMAL HIGH (ref 70–99)
Glucose-Capillary: 130 mg/dL — ABNORMAL HIGH (ref 70–99)
Glucose-Capillary: 133 mg/dL — ABNORMAL HIGH (ref 70–99)

## 2010-03-13 LAB — CBC
HCT: 27 % — ABNORMAL LOW (ref 39.0–52.0)
HCT: 25.8 % — ABNORMAL LOW (ref 39.0–52.0)
Hemoglobin: 9.2 g/dL — ABNORMAL LOW (ref 13.0–17.0)
MCH: 32.9 pg (ref 26.0–34.0)
MCH: 33 pg (ref 26.0–34.0)
MCHC: 35.2 g/dL (ref 30.0–36.0)
MCHC: 34.9 g/dL (ref 30.0–36.0)
MCV: 93.4 fL (ref 78.0–100.0)
Platelets: 204 10*3/uL (ref 150–400)
Platelets: 204 10*3/uL (ref 150–400)
RBC: 2.89 MIL/uL — ABNORMAL LOW (ref 4.22–5.81)
RDW: 17.3 % — ABNORMAL HIGH (ref 11.5–15.5)
RDW: 17 % — ABNORMAL HIGH (ref 11.5–15.5)
RDW: 16.5 % — ABNORMAL HIGH (ref 11.5–15.5)
WBC: 6.7 10*3/uL (ref 4.0–10.5)
WBC: 7.4 10*3/uL (ref 4.0–10.5)

## 2010-03-13 LAB — PHOSPHORUS: Phosphorus: 5.8 mg/dL — ABNORMAL HIGH (ref 2.3–4.6)

## 2010-03-13 LAB — MAGNESIUM
Magnesium: 1.9 mg/dL (ref 1.5–2.5)
Magnesium: 1.8 mg/dL (ref 1.5–2.5)

## 2010-03-13 LAB — TRIGLYCERIDES: Triglycerides: 159 mg/dL — ABNORMAL HIGH (ref <150)

## 2010-03-13 LAB — BASIC METABOLIC PANEL
Calcium: 8.4 mg/dL (ref 8.4–10.5)
Creatinine, Ser: 5.38 mg/dL — ABNORMAL HIGH (ref 0.4–1.5)
GFR calc Af Amer: 13 mL/min — ABNORMAL LOW (ref >60)
GFR calc non Af Amer: 11 mL/min — ABNORMAL LOW (ref >60)
Sodium: 138 mEq/L (ref 135–145)

## 2010-03-13 LAB — OSMOLALITY: Osmolality: 312 mOsm/kg — ABNORMAL HIGH (ref 275–300)

## 2010-03-13 LAB — OSMOLALITY, URINE: Osmolality, Ur: 367 mOsm/kg — ABNORMAL LOW (ref 390–1090)

## 2010-03-13 LAB — CHLORIDE, URINE, RANDOM: Chloride Urine: 70 mEq/L

## 2010-03-13 NOTE — Consult Note (Signed)
NAMESANAD, FEARNOW              ACCOUNT NO.:  [id number]  MEDICAL RECORD NO.:  [id number]         PATIENT TYPE:  PINP  LOCATION:  IC05                          FACILITY:  [facility]  PHYSICIAN:  Jorja Loa, M.D.DATE OF BIRTH:  1951/01/11  DATE OF CONSULTATION: DATE OF DISCHARGE:                                CONSULTATION   REASON FOR CONSULTATION:  Worsening of renal failure.  Mr. Howk is a 60 year old gentleman who has a previous history of chronic liver disease and initially admitted in December of last year because of intestinal mass.  Since the patient was found to have CA of the colon, he has underwent a hemicolectomy.  After the patient's general condition improved, discharged home, then to come back again in January 17 because of increased abdominal pain and also nausea and vomiting.  When the patient was evaluated, possibility of obstruction was entertained.  The patient had another operation.  The patient had stenosis of the intestinal defect and intra-abdominal drain was placed. His abdomen was closed.  During the initial admission, his BUN and creatinine was okay; however, when he comes back on January 17, creatinine was about 3 with low potassium.  Since then, his BUN and creatinine has been fluctuating; however, the last couple of days, BUN and creatinine continues to increase to present level of 5.86. Presently, consult is called because of worsening of his renal failure. Since the patient presently is intubated, difficult to get any additional history; however, looking at his blood work before this admission, his creatinine was normal.  PAST MEDICAL HISTORY:  As stated above. 1. The patient has history of liver cirrhosis. 2. History of colonic CA status post surgery. 3. History of revision and suture of his colectomy. 4. Possible history of also alcohol abuse.  MEDICATIONS:  At this moment consist of Lovenox 30 mg subcu for 24 hours.  He is getting  some fat emulsion, Lasix 40 mg IV q.8 h.  He is on Lactated Ringer at 90 mL per hour, Protonix 40 mg IV once a day.  He is getting also potassium 10 mEq.  He was given other loadings because of the low potassium.  Other medications seems to be p.r.n. medication.  He is also getting __________ at 50 mL per hour.  ALLERGIES:  No known allergy.  SOCIAL HISTORY:  The only documentation is a history of smoking.  No history of documented illicit drug use.  FAMILY HISTORY:  There is no history of renal failure.  REVIEW OF SYSTEMS:  At this moment not available.  PHYSICAL EXAMINATION:  GENERAL:  The patient seems to be awake, intubated, non-communicative, does not seem to be in any type of distress. VITAL SIGNS:  His heart rate is 100, blood pressure 135/84. CHEST:  He has decreased breath sounds.  He has some inspiratory crackles. HEART:  Regular rate and rhythm.  No murmur. ABDOMEN:  He has a dressing with drainage and also some surgical clip, hypoactive. EXTREMITIES:  Upper extremities have some edema.  Lower extremities no edema.  His blood work, pH 7.37, pCO2 of 27.  White blood cell count is 6, hemoglobin 9.2, hematocrit  26.6.  Sodium is 138, potassium is 2.9, CO2 of 18, BUN is 88, creatinine is 5.86.  Creatinine has been increasing since January 13 from 2.75, 5.5, 5.6 to present level.  Prior to that, however, his creatinine was normal.  His albumin is 1.5 and his phosphorus is 6.1 and his SGOT and SGPT seems to be normal.  He had a chest x-ray which showed a borderline enlarged cardiac size.  There is right basilar atelectasis versus infiltrate and there is a small amount of pleural effusions, overall improved aeration.  ASSESSMENT: 1. Renal failure, at this moment seems to be acute as the patient has     previous normal renal function.  The etiology could be     multifactorial.  When he came on January 13, creatinine was 1.03.     During that time, he has nausea or vomiting.   Hence, seems to be     prerenal.  However, ATN and interstitial nephritis at this moment     cannot be ruled out.  His renal function progressively seems to be     worsening at this moment. 2. History of hypokalemia, possibly from renal loss. 3. History of abdominal surgery. 4. History of respiratory failure.  He is still intubated.  He has     some infiltrate. 5. History of liver disease, alcoholic, his liver function test at     this moment is normal. 6. History of mild acidosis. 7. History of anemia, possibly secondary to blood loss. 8. History of severe hypoalbuminemia, albumin is 1.5.  He is started     on TPN and he is getting albumin.  RECOMMENDATIONS:  We will do ultrasound of the kidneys to make sure the patient does not have any obstruction.  From the previous CT, it is normal and we will start him on half-normal saline with potassium supplement and will follow his input and output and will continue with other treatment.  We will check also his UA.  If his overall renal function improves, we may discontinue Lactated Ringer's.  We will continue the other treatment and will follow the patient.     Jorja Loa, M.D.     BB/MEDQ  D:  03/07/2010  T:  03/08/2010  Job:  [id number]  Electronically Signed by Jorja Loa M.D. on 03/13/2010 11:29:59 AM

## 2010-03-14 LAB — GLUCOSE, CAPILLARY
Glucose-Capillary: 121 mg/dL — ABNORMAL HIGH (ref 70–99)
Glucose-Capillary: 124 mg/dL — ABNORMAL HIGH (ref 70–99)
Glucose-Capillary: 115 mg/dL — ABNORMAL HIGH (ref 70–99)
Glucose-Capillary: 121 mg/dL — ABNORMAL HIGH (ref 70–99)
Glucose-Capillary: 125 mg/dL — ABNORMAL HIGH (ref 70–99)

## 2010-03-14 LAB — DIFFERENTIAL
Basophils Absolute: 0 K/uL (ref 0.0–0.1)
Basophils Absolute: 0 10*3/uL (ref 0.0–0.1)
Basophils Relative: 0 % (ref 0–1)
Basophils Relative: 0 % (ref 0–1)
Eosinophils Absolute: 0.1 K/uL (ref 0.0–0.7)
Eosinophils Absolute: 0.1 10*3/uL (ref 0.0–0.7)
Eosinophils Relative: 1 % (ref 0–5)
Eosinophils Relative: 1 % (ref 0–5)
Eosinophils Relative: 1 % (ref 0–5)
Lymphocytes Relative: 4 % — ABNORMAL LOW (ref 12–46)
Lymphocytes Relative: 4 % — ABNORMAL LOW (ref 12–46)
Lymphs Abs: 0.4 K/uL — ABNORMAL LOW (ref 0.7–4.0)
Lymphs Abs: 0.4 10*3/uL — ABNORMAL LOW (ref 0.7–4.0)
Monocytes Absolute: 0.5 K/uL (ref 0.1–1.0)
Monocytes Relative: 5 % (ref 3–12)
Monocytes Relative: 6 % (ref 3–12)
Neutro Abs: 9.4 K/uL — ABNORMAL HIGH (ref 1.7–7.7)
Neutrophils Relative %: 90 % — ABNORMAL HIGH (ref 43–77)
Neutrophils Relative %: 89 % — ABNORMAL HIGH (ref 43–77)
Neutrophils Relative %: 90 % — ABNORMAL HIGH (ref 43–77)

## 2010-03-14 LAB — CBC
HCT: 26.5 % — ABNORMAL LOW (ref 39.0–52.0)
HCT: 25 % — ABNORMAL LOW (ref 39.0–52.0)
Hemoglobin: 9.5 g/dL — ABNORMAL LOW (ref 13.0–17.0)
Hemoglobin: 8.8 g/dL — ABNORMAL LOW (ref 13.0–17.0)
MCH: 33.1 pg (ref 26.0–34.0)
MCHC: 35.8 g/dL (ref 30.0–36.0)
MCV: 92.3 fL (ref 78.0–100.0)
Platelets: 265 K/uL (ref 150–400)
Platelets: 330 10*3/uL (ref 150–400)
RBC: 2.87 MIL/uL — ABNORMAL LOW (ref 4.22–5.81)
RBC: 2.71 MIL/uL — ABNORMAL LOW (ref 4.22–5.81)
RBC: 2.71 MIL/uL — ABNORMAL LOW (ref 4.22–5.81)
RDW: 16.3 % — ABNORMAL HIGH (ref 11.5–15.5)
RDW: 16.2 % — ABNORMAL HIGH (ref 11.5–15.5)
WBC: 10.4 K/uL (ref 4.0–10.5)
WBC: 12.4 10*3/uL — ABNORMAL HIGH (ref 4.0–10.5)
WBC: 14.6 10*3/uL — ABNORMAL HIGH (ref 4.0–10.5)

## 2010-03-14 LAB — PREPARE RBC (CROSSMATCH)

## 2010-03-14 LAB — COMPREHENSIVE METABOLIC PANEL
Albumin: 2.2 g/dL — ABNORMAL LOW (ref 3.5–5.2)
BUN: 117 mg/dL — ABNORMAL HIGH (ref 6–23)
Chloride: 105 mEq/L (ref 96–112)
Creatinine, Ser: 5.15 mg/dL — ABNORMAL HIGH (ref 0.4–1.5)
GFR calc non Af Amer: 12 mL/min — ABNORMAL LOW (ref >60)
Glucose, Bld: 125 mg/dL — ABNORMAL HIGH (ref 70–99)
Total Bilirubin: 0.6 mg/dL (ref 0.3–1.2)

## 2010-03-14 LAB — BASIC METABOLIC PANEL
BUN: 109 mg/dL — ABNORMAL HIGH (ref 6–23)
CO2: 14 mEq/L — ABNORMAL LOW (ref 19–32)
CO2: 13 mEq/L — ABNORMAL LOW (ref 19–32)
CO2: 12 mEq/L — ABNORMAL LOW (ref 19–32)
Calcium: 8.5 mg/dL (ref 8.4–10.5)
Chloride: 112 mEq/L (ref 96–112)
Creatinine, Ser: 5.07 mg/dL — ABNORMAL HIGH (ref 0.4–1.5)
Creatinine, Ser: 5.08 mg/dL — ABNORMAL HIGH (ref 0.4–1.5)
GFR calc Af Amer: 14 mL/min — ABNORMAL LOW (ref >60)
Glucose, Bld: 133 mg/dL — ABNORMAL HIGH (ref 70–99)
Glucose, Bld: 133 mg/dL — ABNORMAL HIGH (ref 70–99)
Glucose, Bld: 115 mg/dL — ABNORMAL HIGH (ref 70–99)
Potassium: 4 mEq/L (ref 3.5–5.1)
Potassium: 4.1 mEq/L (ref 3.5–5.1)
Sodium: 131 mEq/L — ABNORMAL LOW (ref 135–145)

## 2010-03-14 LAB — URINE MICROSCOPIC-ADD ON

## 2010-03-14 LAB — URINALYSIS, ROUTINE W REFLEX MICROSCOPIC
Leukocytes, UA: NEGATIVE
Nitrite: NEGATIVE
Protein, ur: NEGATIVE mg/dL
Urine Glucose, Fasting: NEGATIVE mg/dL
pH: 5.5 (ref 5.0–8.0)

## 2010-03-14 LAB — MAGNESIUM
Magnesium: 1.7 mg/dL (ref 1.5–2.5)
Magnesium: 1.6 mg/dL (ref 1.5–2.5)

## 2010-03-14 LAB — PHOSPHORUS: Phosphorus: 5.8 mg/dL — ABNORMAL HIGH (ref 2.3–4.6)

## 2010-03-14 LAB — CHOLESTEROL, TOTAL: Cholesterol: 58 mg/dL (ref 0–200)

## 2010-03-15 LAB — URINE CULTURE
Colony Count: NO GROWTH
Culture  Setup Time: 201201222122
Culture: NO GROWTH

## 2010-03-15 LAB — DIFFERENTIAL
Basophils Absolute: 0 10*3/uL (ref 0.0–0.1)
Basophils Relative: 0 % (ref 0–1)
Basophils Relative: 0 % (ref 0–1)
Eosinophils Absolute: 0.1 10*3/uL (ref 0.0–0.7)
Eosinophils Relative: 1 % (ref 0–5)
Eosinophils Relative: 1 % (ref 0–5)
Lymphs Abs: 0.4 10*3/uL — ABNORMAL LOW (ref 0.7–4.0)
Monocytes Absolute: 0.8 10*3/uL (ref 0.1–1.0)
Neutrophils Relative %: 90 % — ABNORMAL HIGH (ref 43–77)

## 2010-03-15 LAB — CBC
MCHC: 35.1 g/dL (ref 30.0–36.0)
MCV: 90.4 fL (ref 78.0–100.0)
Platelets: 366 10*3/uL (ref 150–400)
Platelets: 374 10*3/uL (ref 150–400)
RBC: 2.92 MIL/uL — ABNORMAL LOW (ref 4.22–5.81)
RDW: 16.3 % — ABNORMAL HIGH (ref 11.5–15.5)
RDW: 17 % — ABNORMAL HIGH (ref 11.5–15.5)
WBC: 13.3 10*3/uL — ABNORMAL HIGH (ref 4.0–10.5)
WBC: 11.4 10*3/uL — ABNORMAL HIGH (ref 4.0–10.5)

## 2010-03-15 LAB — COMPREHENSIVE METABOLIC PANEL
ALT: <8 U/L (ref 0–53)
ALT: 11 U/L (ref 0–53)
AST: 16 U/L (ref 0–37)
Albumin: 2 g/dL — ABNORMAL LOW (ref 3.5–5.2)
Alkaline Phosphatase: 186 U/L — ABNORMAL HIGH (ref 39–117)
Calcium: 8 mg/dL — ABNORMAL LOW (ref 8.4–10.5)
Chloride: 104 mEq/L (ref 96–112)
GFR calc Af Amer: 13 mL/min — ABNORMAL LOW (ref >60)
Glucose, Bld: 775 mg/dL (ref 70–99)
Glucose, Bld: 119 mg/dL — ABNORMAL HIGH (ref 70–99)
Potassium: 3.5 mEq/L (ref 3.5–5.1)
Sodium: 127 mEq/L — ABNORMAL LOW (ref 135–145)
Sodium: 135 mEq/L (ref 135–145)
Total Protein: 5.3 g/dL — ABNORMAL LOW (ref 6.0–8.3)
Total Protein: 5.4 g/dL — ABNORMAL LOW (ref 6.0–8.3)

## 2010-03-15 LAB — GLUCOSE, CAPILLARY
Glucose-Capillary: 122 mg/dL — ABNORMAL HIGH (ref 70–99)
Glucose-Capillary: 126 mg/dL — ABNORMAL HIGH (ref 70–99)
Glucose-Capillary: 139 mg/dL — ABNORMAL HIGH (ref 70–99)
Glucose-Capillary: 141 mg/dL — ABNORMAL HIGH (ref 70–99)

## 2010-03-15 LAB — CROSSMATCH
ABO/RH(D): B POS
Unit division: 0
Unit division: 0

## 2010-03-15 LAB — PREALBUMIN: Prealbumin: 9.4 mg/dL — ABNORMAL LOW (ref 17.0–34.0)

## 2010-03-15 LAB — MAGNESIUM
Magnesium: 1.5 mg/dL (ref 1.5–2.5)
Magnesium: 1.8 mg/dL (ref 1.5–2.5)
Magnesium: 1.3 mg/dL — ABNORMAL LOW (ref 1.5–2.5)

## 2010-03-15 LAB — BASIC METABOLIC PANEL
BUN: 124 mg/dL — ABNORMAL HIGH (ref 6–23)
Chloride: 109 mEq/L (ref 96–112)
Creatinine, Ser: 5.34 mg/dL — ABNORMAL HIGH (ref 0.4–1.5)
GFR calc Af Amer: 13 mL/min — ABNORMAL LOW (ref >60)
GFR calc non Af Amer: 11 mL/min — ABNORMAL LOW (ref >60)
Potassium: 3.7 mEq/L (ref 3.5–5.1)

## 2010-03-15 LAB — PHOSPHORUS: Phosphorus: 6 mg/dL — ABNORMAL HIGH (ref 2.3–4.6)

## 2010-03-15 LAB — CLOSTRIDIUM DIFFICILE BY PCR: Toxigenic C. Difficile by PCR: NEGATIVE

## 2010-03-16 LAB — COMPREHENSIVE METABOLIC PANEL
ALT: 9 U/L (ref 0–53)
AST: 18 U/L (ref 0–37)
Albumin: 1.9 g/dL — ABNORMAL LOW (ref 3.5–5.2)
BUN: 136 mg/dL — ABNORMAL HIGH (ref 6–23)
GFR calc non Af Amer: 10 mL/min — ABNORMAL LOW (ref >60)
Glucose, Bld: 121 mg/dL — ABNORMAL HIGH (ref 70–99)
Potassium: 3.4 mEq/L — ABNORMAL LOW (ref 3.5–5.1)

## 2010-03-16 LAB — GLUCOSE, CAPILLARY
Glucose-Capillary: 131 mg/dL — ABNORMAL HIGH (ref 70–99)
Glucose-Capillary: 150 mg/dL — ABNORMAL HIGH (ref 70–99)

## 2010-03-16 LAB — CBC
HCT: 25.1 % — ABNORMAL LOW (ref 39.0–52.0)
MCV: 90.3 fL (ref 78.0–100.0)
RDW: 16.1 % — ABNORMAL HIGH (ref 11.5–15.5)
WBC: 10.6 10*3/uL — ABNORMAL HIGH (ref 4.0–10.5)

## 2010-03-16 LAB — DIFFERENTIAL
Basophils Absolute: 0 10*3/uL (ref 0.0–0.1)
Lymphocytes Relative: 7 % — ABNORMAL LOW (ref 12–46)
Monocytes Absolute: 0.8 10*3/uL (ref 0.1–1.0)
Neutro Abs: 8.9 10*3/uL — ABNORMAL HIGH (ref 1.7–7.7)

## 2010-03-17 LAB — COMPREHENSIVE METABOLIC PANEL
AST: 23 U/L (ref 0–37)
CO2: 20 mEq/L (ref 19–32)
Chloride: 103 mEq/L (ref 96–112)
Creatinine, Ser: 4.97 mg/dL — ABNORMAL HIGH (ref 0.4–1.5)
GFR calc Af Amer: 15 mL/min — ABNORMAL LOW (ref >60)
GFR calc non Af Amer: 12 mL/min — ABNORMAL LOW (ref >60)
Glucose, Bld: 137 mg/dL — ABNORMAL HIGH (ref 70–99)
Total Bilirubin: 0.7 mg/dL (ref 0.3–1.2)

## 2010-03-17 LAB — CBC
HCT: 26.6 % — ABNORMAL LOW (ref 39.0–52.0)
Hemoglobin: 9.4 g/dL — ABNORMAL LOW (ref 13.0–17.0)
MCH: 32.1 pg (ref 26.0–34.0)
MCHC: 35.3 g/dL (ref 30.0–36.0)
MCV: 90.8 fL (ref 78.0–100.0)
RBC: 2.93 MIL/uL — ABNORMAL LOW (ref 4.22–5.81)

## 2010-03-17 LAB — DIFFERENTIAL
Basophils Relative: 1 % (ref 0–1)
Lymphocytes Relative: 5 % — ABNORMAL LOW (ref 12–46)
Lymphs Abs: 0.6 10*3/uL — ABNORMAL LOW (ref 0.7–4.0)
Monocytes Absolute: 1.3 10*3/uL — ABNORMAL HIGH (ref 0.1–1.0)
Monocytes Relative: 10 % (ref 3–12)
Neutro Abs: 10.2 10*3/uL — ABNORMAL HIGH (ref 1.7–7.7)
Neutrophils Relative %: 83 % — ABNORMAL HIGH (ref 43–77)

## 2010-03-17 LAB — MAGNESIUM: Magnesium: 1.8 mg/dL (ref 1.5–2.5)

## 2010-03-17 LAB — GLUCOSE, CAPILLARY: Glucose-Capillary: 152 mg/dL — ABNORMAL HIGH (ref 70–99)

## 2010-03-18 LAB — BASIC METABOLIC PANEL
CO2: 26 mEq/L (ref 19–32)
Calcium: 8.5 mg/dL (ref 8.4–10.5)
GFR calc Af Amer: 20 mL/min — ABNORMAL LOW (ref >60)
GFR calc non Af Amer: 17 mL/min — ABNORMAL LOW (ref >60)
Potassium: 3 mEq/L — ABNORMAL LOW (ref 3.5–5.1)
Sodium: 136 mEq/L (ref 135–145)

## 2010-03-18 LAB — DIFFERENTIAL
Basophils Absolute: 0.1 10*3/uL (ref 0.0–0.1)
Basophils Relative: 1 % (ref 0–1)
Eosinophils Absolute: 0.1 10*3/uL (ref 0.0–0.7)
Neutrophils Relative %: 84 % — ABNORMAL HIGH (ref 43–77)

## 2010-03-18 LAB — PROTIME-INR
INR: 1.26 (ref 0.00–1.49)
Prothrombin Time: 16 seconds — ABNORMAL HIGH (ref 11.6–15.2)

## 2010-03-18 LAB — CBC
Platelets: 347 10*3/uL (ref 150–400)
RBC: 2.82 MIL/uL — ABNORMAL LOW (ref 4.22–5.81)
WBC: 13.3 10*3/uL — ABNORMAL HIGH (ref 4.0–10.5)

## 2010-03-18 LAB — GLUCOSE, CAPILLARY
Glucose-Capillary: 140 mg/dL — ABNORMAL HIGH (ref 70–99)
Glucose-Capillary: 147 mg/dL — ABNORMAL HIGH (ref 70–99)

## 2010-03-18 LAB — PHOSPHORUS: Phosphorus: 3.6 mg/dL (ref 2.3–4.6)

## 2010-03-19 LAB — GLUCOSE, CAPILLARY
Glucose-Capillary: 154 mg/dL — ABNORMAL HIGH (ref 70–99)
Glucose-Capillary: 155 mg/dL — ABNORMAL HIGH (ref 70–99)
Glucose-Capillary: 161 mg/dL — ABNORMAL HIGH (ref 70–99)

## 2010-03-19 LAB — DIFFERENTIAL
Basophils Absolute: 0.1 10*3/uL (ref 0.0–0.1)
Basophils Relative: 0 % (ref 0–1)
Monocytes Relative: 8 % (ref 3–12)
Neutro Abs: 11.4 10*3/uL — ABNORMAL HIGH (ref 1.7–7.7)
Neutrophils Relative %: 83 % — ABNORMAL HIGH (ref 43–77)

## 2010-03-19 LAB — BASIC METABOLIC PANEL
BUN: 89 mg/dL — ABNORMAL HIGH (ref 6–23)
Calcium: 8.8 mg/dL (ref 8.4–10.5)
Creatinine, Ser: 4.64 mg/dL — ABNORMAL HIGH (ref 0.4–1.5)
GFR calc non Af Amer: 14 mL/min — ABNORMAL LOW (ref >60)
GFR calc non Af Amer: 13 mL/min — ABNORMAL LOW (ref >60)
Glucose, Bld: 136 mg/dL — ABNORMAL HIGH (ref 70–99)
Glucose, Bld: 135 mg/dL — ABNORMAL HIGH (ref 70–99)
Potassium: 3 mEq/L — ABNORMAL LOW (ref 3.5–5.1)
Sodium: 131 mEq/L — ABNORMAL LOW (ref 135–145)

## 2010-03-19 LAB — CBC
Hemoglobin: 8.3 g/dL — ABNORMAL LOW (ref 13.0–17.0)
MCH: 31.3 pg (ref 26.0–34.0)
RBC: 2.65 MIL/uL — ABNORMAL LOW (ref 4.22–5.81)
WBC: 13.7 10*3/uL — ABNORMAL HIGH (ref 4.0–10.5)

## 2010-03-20 LAB — CBC
MCH: 32 pg (ref 26.0–34.0)
Platelets: 254 10*3/uL (ref 150–400)
RBC: 2.5 MIL/uL — ABNORMAL LOW (ref 4.22–5.81)
RDW: 15.9 % — ABNORMAL HIGH (ref 11.5–15.5)

## 2010-03-20 LAB — DIFFERENTIAL
Basophils Relative: 0 % (ref 0–1)
Eosinophils Absolute: 0.2 10*3/uL (ref 0.0–0.7)
Eosinophils Relative: 1 % (ref 0–5)
Monocytes Relative: 4 % (ref 3–12)
Neutrophils Relative %: 86 % — ABNORMAL HIGH (ref 43–77)

## 2010-03-20 LAB — COMPREHENSIVE METABOLIC PANEL
Alkaline Phosphatase: 270 U/L — ABNORMAL HIGH (ref 39–117)
BUN: 100 mg/dL — ABNORMAL HIGH (ref 6–23)
CO2: 22 mEq/L (ref 19–32)
Chloride: 96 mEq/L (ref 96–112)
Creatinine, Ser: 5.19 mg/dL — ABNORMAL HIGH (ref 0.4–1.5)
GFR calc non Af Amer: 12 mL/min — ABNORMAL LOW (ref >60)
Total Bilirubin: 1 mg/dL (ref 0.3–1.2)

## 2010-03-20 LAB — TRIGLYCERIDES: Triglycerides: 138 mg/dL (ref <150)

## 2010-03-20 LAB — GLUCOSE, CAPILLARY: Glucose-Capillary: 146 mg/dL — ABNORMAL HIGH (ref 70–99)

## 2010-03-20 LAB — PHOSPHORUS: Phosphorus: 7.3 mg/dL — ABNORMAL HIGH (ref 2.3–4.6)

## 2010-03-21 LAB — DIFFERENTIAL
Basophils Absolute: 0 10*3/uL (ref 0.0–0.1)
Basophils Relative: 0 % (ref 0–1)
Eosinophils Absolute: 0.1 10*3/uL (ref 0.0–0.7)
Neutro Abs: 11.2 10*3/uL — ABNORMAL HIGH (ref 1.7–7.7)
Neutrophils Relative %: 84 % — ABNORMAL HIGH (ref 43–77)

## 2010-03-21 LAB — GLUCOSE, CAPILLARY
Glucose-Capillary: 147 mg/dL — ABNORMAL HIGH (ref 70–99)
Glucose-Capillary: 137 mg/dL — ABNORMAL HIGH (ref 70–99)

## 2010-03-21 LAB — CBC
MCH: 31.3 pg (ref 26.0–34.0)
MCHC: 34.5 g/dL (ref 30.0–36.0)
Platelets: 244 10*3/uL (ref 150–400)
RDW: 16.2 % — ABNORMAL HIGH (ref 11.5–15.5)

## 2010-03-21 LAB — PREALBUMIN: Prealbumin: 9.1 mg/dL — ABNORMAL LOW (ref 17.0–34.0)

## 2010-03-21 LAB — BASIC METABOLIC PANEL
Calcium: 8.5 mg/dL (ref 8.4–10.5)
GFR calc Af Amer: 24 mL/min — ABNORMAL LOW (ref >60)
GFR calc non Af Amer: 20 mL/min — ABNORMAL LOW (ref >60)
Glucose, Bld: 123 mg/dL — ABNORMAL HIGH (ref 70–99)
Potassium: 3.8 mEq/L (ref 3.5–5.1)
Sodium: 135 mEq/L (ref 135–145)

## 2010-03-21 LAB — PHOSPHORUS: Phosphorus: 3.9 mg/dL (ref 2.3–4.6)

## 2010-03-21 LAB — MAGNESIUM: Magnesium: 2 mg/dL (ref 1.5–2.5)

## 2010-03-22 ENCOUNTER — Inpatient Hospital Stay (HOSPITAL_COMMUNITY): Payer: Medicaid Other

## 2010-03-22 LAB — BASIC METABOLIC PANEL
BUN: 78 mg/dL — ABNORMAL HIGH (ref 6–23)
Chloride: 93 mEq/L — ABNORMAL LOW (ref 96–112)
Creatinine, Ser: 4.05 mg/dL — ABNORMAL HIGH (ref 0.4–1.5)
GFR calc Af Amer: 19 mL/min — ABNORMAL LOW (ref >60)
GFR calc non Af Amer: 15 mL/min — ABNORMAL LOW (ref >60)
Potassium: 3.6 mEq/L (ref 3.5–5.1)

## 2010-03-22 LAB — GLUCOSE, CAPILLARY
Glucose-Capillary: 126 mg/dL — ABNORMAL HIGH (ref 70–99)
Glucose-Capillary: 127 mg/dL — ABNORMAL HIGH (ref 70–99)
Glucose-Capillary: 134 mg/dL — ABNORMAL HIGH (ref 70–99)

## 2010-03-22 LAB — DIFFERENTIAL
Basophils Absolute: 0 10*3/uL (ref 0.0–0.1)
Basophils Relative: 0 % (ref 0–1)
Eosinophils Absolute: 0.2 10*3/uL (ref 0.0–0.7)
Neutro Abs: 11.8 10*3/uL — ABNORMAL HIGH (ref 1.7–7.7)
Neutrophils Relative %: 85 % — ABNORMAL HIGH (ref 43–77)

## 2010-03-22 LAB — CBC
Hemoglobin: 7.5 g/dL — ABNORMAL LOW (ref 13.0–17.0)
MCH: 31.4 pg (ref 26.0–34.0)
Platelets: 225 10*3/uL (ref 150–400)
RBC: 2.39 MIL/uL — ABNORMAL LOW (ref 4.22–5.81)

## 2010-03-22 LAB — MAGNESIUM: Magnesium: 2 mg/dL (ref 1.5–2.5)

## 2010-03-22 LAB — PHOSPHORUS: Phosphorus: 4.6 mg/dL (ref 2.3–4.6)

## 2010-03-23 LAB — COMPREHENSIVE METABOLIC PANEL
AST: 23 U/L (ref 0–37)
Albumin: 1.9 g/dL — ABNORMAL LOW (ref 3.5–5.2)
BUN: 47 mg/dL — ABNORMAL HIGH (ref 6–23)
Calcium: 8.6 mg/dL (ref 8.4–10.5)
Creatinine, Ser: 2.89 mg/dL — ABNORMAL HIGH (ref 0.4–1.5)
GFR calc Af Amer: 27 mL/min — ABNORMAL LOW (ref >60)
Total Protein: 6.7 g/dL (ref 6.0–8.3)

## 2010-03-23 LAB — CROSSMATCH
ABO/RH(D): B POS
Antibody Screen: NEGATIVE
Unit division: 0
Unit division: 0

## 2010-03-23 LAB — GLUCOSE, CAPILLARY
Glucose-Capillary: 123 mg/dL — ABNORMAL HIGH (ref 70–99)
Glucose-Capillary: 132 mg/dL — ABNORMAL HIGH (ref 70–99)

## 2010-03-23 LAB — PHOSPHORUS: Phosphorus: 3.1 mg/dL (ref 2.3–4.6)

## 2010-03-23 LAB — CBC
HCT: 28.8 % — ABNORMAL LOW (ref 39.0–52.0)
Hemoglobin: 9.9 g/dL — ABNORMAL LOW (ref 13.0–17.0)
MCV: 88.9 fL (ref 78.0–100.0)
Platelets: 261 10*3/uL (ref 150–400)
RBC: 3.24 MIL/uL — ABNORMAL LOW (ref 4.22–5.81)
WBC: 13.5 10*3/uL — ABNORMAL HIGH (ref 4.0–10.5)

## 2010-03-23 LAB — DIFFERENTIAL
Eosinophils Absolute: 0.1 10*3/uL (ref 0.0–0.7)
Lymphocytes Relative: 9 % — ABNORMAL LOW (ref 12–46)
Lymphs Abs: 1.2 10*3/uL (ref 0.7–4.0)
Monocytes Relative: 9 % (ref 3–12)
Neutro Abs: 11 10*3/uL — ABNORMAL HIGH (ref 1.7–7.7)
Neutrophils Relative %: 82 % — ABNORMAL HIGH (ref 43–77)

## 2010-03-23 NOTE — Assessment & Plan Note (Signed)
Summary: severe abd pain,PCP wants pt seen ASAP/ss   Visit Type:  Initial Consult Referring Provider:  McInnis Primary Care Provider:  McInnis  CC:  severe abd pain.  History of Present Illness: Mr. Collard presents today at the request of Dr. Renard Matter, secondary to abdominal pain. Went to ED on Sunday, saw Dr. McInnis on Monday. Given prilosec in ED, slight improvement of symptoms.  for last month, wakes up in am "heaving". On sunday, +nausea, severe vomiting. No hematemesis, as "weak as water". c/o epigastric pain extending to umbilicus that started on Sunday. achy, like a toothache. "constant", sometimes feels like "earthquake" inside. not aggravated by eating/drinking. +loss of appetite X 1 month. last reported weighing 190 in spring, now 177. hx of GERD X several years, was controlled with Protonix, now getting worse over past few months. intermittent dysphagia. has BM every day, no melena or hematochezia. never had colonoscopy or endoscopy.  no prior blood transfusions, no tattoos. +ETOH use daily, used to drink beer/wine, exacerbated gout. switched to bourbon 10-12 oz/day. throughout the day. no drug use. no nicotine. no NSAIDs, goody\'s, bc.   Tbili: 2.6, AST 111, Direct bilirubin 0.8, Indirect bili: 1.8, lipase 35  US of abdomen Jan 30, 2010:Heterogeneous echotexture within the liver.  Hepatomegaly.  Nodular contours.  Finding suggest cirrhosis.  Spleen upper limits normal  in size.  Recanalized paraumbilical vein. CT  CT Dec 2011: No acute intra-abdominal or pelvic pathology.  Specifically, the   appendix is normal and no radiopaque renal ureteral stone is   identified.   Hepatic steatosis.  Hypodense too small to characterize lesion in the left kidney,   which differential considerations primarily include cyst or   possibly abscess if the patient has any clinical sign of infection.   Consider outpatient follow-up abdominal CT with contrast in 6   months.   Current Medications  (verified): 1)  Promethazine Hcl 25 Mg Tabs (Promethazine Hcl) .... As Needed 2)  Ciprofloxacin Hcl 500 Mg Tabs (Ciprofloxacin Hcl) .... Take 1 Tablet By Mouth Two Times A Day 3)  Protonix 40 Mg Tbec (Pantoprazole Sodium) .... Take 1 Tablet By Mouth Two Times A Day 4)  Hydrocodone 5/325 Mg .... One Tablet Every 4-6 Hours As Needed 5)  Milk Thistle 70mg .... Take 1 Tablet By Mouth Once A Day  Allergies (verified): No Known Drug Allergies  Past History:  Past Medical History: GERD Gout  Past Surgical History: None  Family History: Mother: healthy  Father:deceased, cirrhosis, ETOH-related siblings: oldest of 10   Social History: Patient has never smoked.  Alcohol Use - yes Occupation: farmer (garden vegetables) and carpenter Married X 17 years, total of four children Smoking Status:  never  Review of Systems General:  Denies fever and chills; c/o loss of appetite . Eyes:  Denies blurring, irritation, and vision loss. ENT:  Complains of difficulty swallowing; denies sore throat and hoarseness. CV:  Denies chest pains, palpitations, and syncope. Resp:  Denies dyspnea at rest and wheezing. GI:  Complains of difficulty swallowing, nausea, vomiting, and abdominal pain; denies vomiting blood, change in bowel habits, bloody BM\'s, and black BMs. GU:  Denies urinary burning and urinary frequency. MS:  Denies joint pain / LOM, joint stiffness, and low back pain. Derm:  Denies itching and dry skin. Neuro:  Denies weakness and syncope. Psych:  Denies depression and anxiety.  Vital Signs:  Patient profile:   60 year old male Height:      70  inches Weight:  176 pounds BMI:     25.34 Temp:     98.6 degrees F oral Pulse rate:   66 / minute BP sitting:   124 / 80  (left arm) Cuff size:   regular  Vitals Entered By: Cloria Spring LPN (February 02, 2010 2:09 PM)  Physical Exam  General:  Well developed, well nourished, no acute distress. Head:  Normocephalic and  atraumatic. Eyes:  sclera without icterus Neck:  Supple; no masses or thyromegaly. Lungs:  Clear throughout to auscultation. Heart:  Regular rate and rhythm; no murmurs, rubs,  or bruits. Abdomen:  normal bowel sounds, without guarding, without rebound, no distesion, no masses, and no hepatomegally or splenomegaly.   Msk:  Symmetrical with no gross deformities. Normal posture. Extremities:  No clubbing, cyanosis, edema or deformities noted. Neurologic:  Alert and  oriented x4;  grossly normal neurologically. Skin:  Intact without significant lesions or rashes. Psych:  Alert and cooperative. Normal mood and affect.  Impression & Recommendations:  Problem # 1:  EPIGASTRIC PAIN (ICD-789.06) New onset of epigastric pain, hx of chronic GERD. +dysphagia. +n/v. given prilosec when d/c from ED last week, slight symptom improvement of reflux. continued underlying epigastric pain/dysphagia. +loss of appetite over last several months, +weight loss from 190s in spring to 170s currently. diff dx: gastritis vs PUD,  reflux esophagitis, concern for occult malignancy. No hx of EGD.   EGD/ED with Dr. Jena Gauss: risks, benefits, alternatives discussed with pt, stated understanding. Continue Prilosec Avoid alcohol  Orders: T-Hepatic Function [phone]) T-PT (Prothrombin Time) (09811) T-AFP Tumor Markers [phone]) T-Ammonia ([id number]) T-Hepatitis B Surface Antigen ([id number]) T-Hepatitis C Antibody ([id number]) Consultation Level III (36644)  Problem # 2:  TRANSAMINASES, SERUM, ELEVATED (ICD-790.4) elevated LFTs, findings on [place] suggestive of cirrhosis. incidental finding, likely r/t chronic ETOH abuse. Will check baseline labs, including AFP.   Orders: T-Hepatic Function [phone]) T-PT (Prothrombin Time) (38756) T-AFP Tumor Markers [phone]) T-Ammonia [phone]) T-Hepatitis B Surface Antigen [phone]) T-Hepatitis C Antibody ([id number]) Consultation Level III  (23762)  Problem # 3:  CIRRHOSIS (ICD-571.5)  incidental findings on [place] in December suggestive of cirrhosis. pt with long hx of ETOH use daily, likely cirrhosis r/t ETOH. see #2.   Orders: Consultation Level III (83151)  Problem # 4:  SCREENING COLORECTAL-CANCER (ICD-V50.54)  60 year old, due for initial screening colonoscopy. symptoms of lack of appetite, weight loss from 190 in spring to 177 currently, not purposefully, concerning. No change in bowel habits, no melena or hematochezia. TCS to be performed with EGD. Risks, benefits, alternatives discussed with pt and wife, stated understanding.  Orders: Consultation Level III (76160)  Appended Document: severe abd pain,PCP wants pt seen ASAP/ss Durward Mallard, I'm sorry. This pt needs to be done in the OR. hx of ETOH abuse, currently active.   Appended Document: severe abd pain,PCP wants pt seen ASAP/ss Pt is being done in the or today.

## 2010-03-23 NOTE — Miscellaneous (Signed)
Summary: OP REPORT  Clinical Lists Changes NAMEHAMP, MORELAND              ACCOUNT NO.:  [id number]      MEDICAL RECORD NO.:  [id number]          PATIENT TYPE:  AMB      LOCATION:  DAY                           FACILITY:  [facility]      PHYSICIAN:  R. Roetta Sessions, M.D. DATE OF BIRTH:  01-29-1951      DATE OF PROCEDURE:   DATE OF DISCHARGE:                                  OPERATIVE REPORT         PROCEDURE:  EGD with attempted [facility] dilation, biopsy disruption,   Schatzki's ring followed by biopsy and iliac colonoscopy biopsy, snare   polypectomy.      INDICATIONS FOR PROCEDURE:  A 60 year old gentleman with daily alcohol   consumption with recent new onset epigastric pain and exacerbate chronic   reflux with some esophageal dysphagia to solids.  He comes for further   evaluation of EGD with possible esophageal dilation as appropriate.   Recent ultrasound suggested cirrhosis, an incidental finding.      He has never had a colonoscopy.  Colonoscopy is now being done.  Risks,   benefits, limitations, alternatives, and imponderables have been   discussed, questions answered.  Please see the documentation in the   medical record.      PROCEDURE NOTE:  O2 saturation, blood pressure, pulse, respirations were   monitored throughout the entirety of both procedures.      CONSCIOUS SEDATION:  Versed 9 mg IV, Demerol 125 mg IV in divided doses.   Cetacaine spray for topical pharyngeal anesthesia.      INSTRUMENT:  Pentax video chip system.      FINDINGS:  EGD examination of tubular esophagus revealed a Schatzki's   ring, prominent vascular pattern in the mid proximal esophagus noted the   two columns, short columns grade 1 esophageal varices, overlying mucosa   otherwise appeared normal.  There is no Barrett's esophagus.  EG   junction easily traversed.      Stomach:  Gastric cavity insufflated well with air and was empty.   Thorough examination of gastric mucosa including  retroflexion of   proximal stomach, esophagogastric junction demonstrated small hiatal   hernia, 4-mm nodule in GE junction.  There was also gastric erosions in   the antrum and body.  No ulcer infiltrating process was seen.  The   pylorus was patent, easily traversed.  Examination of the bulb and   second portion revealed no abnormalities.      THERAPEUTIC/DIAGNOSTIC MANEUVERS PERFORMED:  However, through the scope,   I attempted to pass 56-French with [facility] dilator.  The patient was   somewhat combative and tolerated this maneuver.  I have elected to go   ahead and advance the scope and I just performed four quadrant bites of   the ring with a nice disruption and without apparent complication,   minimal bleeding.  The nodular GE junction was biopsied separately.   Subsequently, biopsies of the antrum and body were taken for histologic   study.  The patient tolerated the procedure well, and was  prepared for   colonoscopy.  Digital rectal exam revealed no abnormalities.  Endoscopic   findings:  Prep was suboptimal, but doable.  Colon:  Colonic mucosa was   surveyed from the rectosigmoid junction through left transverse right   colon to the area of the appendiceal orifice.      Please note at the cecum, there was a large fungating multilobulated   polypoid lesion which totally obscured the ileocecal valve and most of   the cecum.  The opening to the appendiceal orifice was adjacent to the   base of this lesion.  I did not see anywhere else to go, although I did   not see all landmarks, I feel I was at the upstream end of the colon.   Please see multiple photographs.  There were also several adjacent   smaller polyps.  This lesion was biopsied multiple times with jumbo   biopsy forceps.  From this level, scope was slowly and cautiously   withdrawn.  All previously mentioned mucosal surfaces were again seen.   At the hepatic flexion was a 1.5-cm around adenomatous-appearing polyp   which  was resected when passed hot snare cautery and pulled out via the   [employer].  Scope was reintroduced up to this level and continuation of   the survey of the colon was undertaken.  At the splenic flexure, there   was a large 3 x 4 cm multilobulated polyp on a stalk which was engaged   with the snare and removed totally with three passes of the snare.   These three pieces were taken out via the [facility], elected to go ahead   and tattooed this lesion within the spot.  No other colonic mucosal   abnormalities were observed.  Scope was pulled down into the rectum.   Thorough examination of the rectal mucosa including retroflexion of the   anal verge demonstrated no abnormalities.  Cecal withdrawal time   aggregate 34 minutes.  The patient tolerated the procedure well.      IMPRESSION:   1. EGD Schatzki's ring status post disruption as described above, 2       short columns grade 1 esophageal varices, proximal esophagus, small       hiatal hernia, 4-mm nodule at the GE junction status post biopsy.   2. Antral and body erosions status post biopsy.  Patent pylorus,       normal D1 and D2.  Colonoscopy findings, normal rectum.   3. Multilobulated fungating mass in the cecum status post biopsy.  Not       a minimal complete endoscopic resection or even attempt at       resection.   4. Hepatic flexure polyp status post hot snare polypectomy.   5. Large multilobulated pedunculated polyp at the splenic flexure       status post piecemeal hot snare polypectomy and tattooing.       Remainder of colonic mucosa appeared normal, although there were       satellite polyps about the large mass in the cecum which were not       removed.   6. Normal rectum.      RECOMMENDATIONS:   1. Follow up on path.   2. The patient needs surgical consultation for removal of cecal mass       pending review of path.   3. Continue Protonix 40 mg orally twice daily.   4. Patient is urged to stop drinking alcohol.  5. We will add Carafate suspension 1 g q.i.d. x7 days to his regimen.               Jonathon Bellows, M.D.               RMR/MEDQ  D:  02/07/2010  T:  02/07/2010  Job:  [id number]      Electronically Signed by Lorrin Goodell M.D. on 02/11/2010 02:58:44 PM

## 2010-03-23 NOTE — Miscellaneous (Signed)
Summary: Orders Update  Clinical Lists Changes  Orders: Added new Test order of T-AFP Tumor Markers ([id number]) - Signed  Appended Document: Orders Update Duplicate.

## 2010-03-23 NOTE — Miscellaneous (Signed)
Summary: HISTORY & PHYSICAL  Clinical Lists Changes  NAMEMarland Kitchen  Peter [name], Peter [name]              ACCOUNT NO.:  [id number]      MEDICAL RECORD NO.:  [id number]        PATIENT TYPE:  PAMB      LOCATION:  DAY                           FACILITY:  [facility]      PHYSICIAN:  Dalia Heading, M.D.  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      HISTORY OF PRESENT ILLNESS:  The patient is a 60 year old white male who   underwent a colonoscopy by Dr. Jena Gauss who found a cecal mass with focal   areas of suspicion for invasive carcinoma as well as a tubulovillous   adenoma with high-grade dysplasia in the splenic flexure.  He now   presents for a partial colectomy.  He does have a history of cirrhosis   and EtOH abuse.  He denies any family history of colon carcinoma.      PAST MEDICAL HISTORY:  As noted above.      PAST SURGICAL HISTORY:  Unremarkable.      CURRENT MEDICATIONS:  None.      ALLERGIES:  No known drug allergies.      REVIEW OF SYSTEMS:  The patient does drink alcohol.  He does smoke   tobacco.  He denies any illicit drug use.      FAMILY MEDICAL HISTORY:  The patient denies family history of colon   carcinoma.      PHYSICAL EXAMINATION:  GENERAL:  The patient is a well-developed, well-   nourished white male in no acute distress.   HEENT:  No scleral icterus.   LUNGS:  Clear to auscultation with equal breath sounds bilaterally.   HEART:  Regular rate and rhythm without S3, S4, or murmurs.   ABDOMEN:  Soft, nontender, and nondistended.  No hepatosplenomegaly,   masses, and hernias are identified.      IMPRESSION:   1. Colon carcinoma, cecum.   2. Dysplastic adenoma, splenic flexure.   3. Ethyl alcohol.   4. History of mild cirrhosis.      PLAN:  The patient is scheduled to undergo a  laparoscopic hand assisted   extended right hemicolectomy on February 22, 2010.  The risks and benefits   of the procedure including bleeding, infection, cardiopulmonary   difficulties, possibility of a blood transfusion were fully explained to   the patient, gave informed consent.               Dalia Heading, M.D.               MAJ/MEDQ  D:  02/14/2010  T:  02/15/2010  Job:  [id number]      cc:   Short Stay at [facility]      R. Roetta Sessions, M.D.   [address]   [place]   [place] [address]  Angus G. Renard Matter, MD   Fax: [phone]      Electronically Signed by Franky Macho M.D. on 02/17/2010 10:08:32 AM

## 2010-03-23 NOTE — Letter (Signed)
Summary: REFERRAL FROM DR [facility]  REFERRAL FROM DR [facility]   Imported By: Rexene Alberts 02/09/2010 15:23:47  _____________________________________________________________________  External Attachment:    Type:   Image     Comment:   External Document

## 2010-03-23 NOTE — Miscellaneous (Signed)
Summary: Orders Update  Clinical Lists Changes  Orders: Added new Test order of T-AFP Tumor Markers ([id number]) - Signed 

## 2010-03-24 ENCOUNTER — Inpatient Hospital Stay (HOSPITAL_COMMUNITY): Payer: Medicaid Other

## 2010-03-24 LAB — BASIC METABOLIC PANEL
BUN: 75 mg/dL — ABNORMAL HIGH (ref 6–23)
Creatinine, Ser: 3.44 mg/dL — ABNORMAL HIGH (ref 0.4–1.5)
GFR calc non Af Amer: 19 mL/min — ABNORMAL LOW (ref >60)
Glucose, Bld: 120 mg/dL — ABNORMAL HIGH (ref 70–99)
Potassium: 3.1 mEq/L — ABNORMAL LOW (ref 3.5–5.1)

## 2010-03-24 LAB — GLUCOSE, CAPILLARY: Glucose-Capillary: 120 mg/dL — ABNORMAL HIGH (ref 70–99)

## 2010-03-24 LAB — CBC
HCT: 28.1 % — ABNORMAL LOW (ref 39.0–52.0)
Hemoglobin: 9.8 g/dL — ABNORMAL LOW (ref 13.0–17.0)
MCH: 30.9 pg (ref 26.0–34.0)
MCHC: 34.9 g/dL (ref 30.0–36.0)

## 2010-03-24 LAB — DIFFERENTIAL
Basophils Relative: 0 % (ref 0–1)
Lymphocytes Relative: 7 % — ABNORMAL LOW (ref 12–46)
Monocytes Absolute: 1.2 10*3/uL — ABNORMAL HIGH (ref 0.1–1.0)
Monocytes Relative: 9 % (ref 3–12)
Neutro Abs: 10.6 10*3/uL — ABNORMAL HIGH (ref 1.7–7.7)

## 2010-03-25 LAB — CBC
HCT: 29.2 % — ABNORMAL LOW (ref 39.0–52.0)
Hemoglobin: 10.1 g/dL — ABNORMAL LOW (ref 13.0–17.0)
MCH: 31 pg (ref 26.0–34.0)
MCHC: 34.6 g/dL (ref 30.0–36.0)

## 2010-03-25 LAB — BASIC METABOLIC PANEL
CO2: 26 mEq/L (ref 19–32)
Calcium: 8.5 mg/dL (ref 8.4–10.5)
Creatinine, Ser: 2.63 mg/dL — ABNORMAL HIGH (ref 0.4–1.5)
Glucose, Bld: 142 mg/dL — ABNORMAL HIGH (ref 70–99)

## 2010-03-25 LAB — GLUCOSE, CAPILLARY
Glucose-Capillary: 134 mg/dL — ABNORMAL HIGH (ref 70–99)
Glucose-Capillary: 143 mg/dL — ABNORMAL HIGH (ref 70–99)

## 2010-03-25 LAB — DIFFERENTIAL
Lymphocytes Relative: 6 % — ABNORMAL LOW (ref 12–46)
Monocytes Absolute: 1.4 10*3/uL — ABNORMAL HIGH (ref 0.1–1.0)
Monocytes Relative: 9 % (ref 3–12)
Neutro Abs: 13.8 10*3/uL — ABNORMAL HIGH (ref 1.7–7.7)

## 2010-03-26 LAB — BASIC METABOLIC PANEL
BUN: 80 mg/dL — ABNORMAL HIGH (ref 6–23)
CO2: 21 mEq/L (ref 19–32)
Chloride: 92 mEq/L — ABNORMAL LOW (ref 96–112)
GFR calc non Af Amer: 19 mL/min — ABNORMAL LOW (ref >60)
Glucose, Bld: 122 mg/dL — ABNORMAL HIGH (ref 70–99)
Potassium: 3.5 mEq/L (ref 3.5–5.1)

## 2010-03-26 LAB — DIFFERENTIAL
Basophils Absolute: 0 10*3/uL (ref 0.0–0.1)
Eosinophils Relative: 1 % (ref 0–5)
Lymphocytes Relative: 4 % — ABNORMAL LOW (ref 12–46)
Monocytes Absolute: 1.4 10*3/uL — ABNORMAL HIGH (ref 0.1–1.0)

## 2010-03-26 LAB — CBC
HCT: 28.3 % — ABNORMAL LOW (ref 39.0–52.0)
MCH: 30.5 pg (ref 26.0–34.0)
MCHC: 34.6 g/dL (ref 30.0–36.0)
RDW: 15.2 % (ref 11.5–15.5)

## 2010-03-26 LAB — GLUCOSE, CAPILLARY: Glucose-Capillary: 133 mg/dL — ABNORMAL HIGH (ref 70–99)

## 2010-03-27 ENCOUNTER — Inpatient Hospital Stay (HOSPITAL_COMMUNITY): Payer: Medicaid Other

## 2010-03-27 ENCOUNTER — Inpatient Hospital Stay (HOSPITAL_COMMUNITY): Payer: Self-pay

## 2010-03-27 ENCOUNTER — Other Ambulatory Visit (HOSPITAL_COMMUNITY): Payer: Self-pay

## 2010-03-27 LAB — DIFFERENTIAL
Basophils Absolute: 0 10*3/uL (ref 0.0–0.1)
Basophils Relative: 0 % (ref 0–1)
Eosinophils Absolute: 0.1 10*3/uL (ref 0.0–0.7)
Eosinophils Relative: 1 % (ref 0–5)
Neutrophils Relative %: 90 % — ABNORMAL HIGH (ref 43–77)

## 2010-03-27 LAB — GLUCOSE, CAPILLARY
Glucose-Capillary: 116 mg/dL — ABNORMAL HIGH (ref 70–99)
Glucose-Capillary: 142 mg/dL — ABNORMAL HIGH (ref 70–99)
Glucose-Capillary: 143 mg/dL — ABNORMAL HIGH (ref 70–99)

## 2010-03-27 LAB — CBC
Platelets: 345 10*3/uL (ref 150–400)
RBC: 3.03 MIL/uL — ABNORMAL LOW (ref 4.22–5.81)
RDW: 15.1 % (ref 11.5–15.5)
WBC: 14.9 10*3/uL — ABNORMAL HIGH (ref 4.0–10.5)

## 2010-03-27 LAB — PHOSPHORUS: Phosphorus: 5.9 mg/dL — ABNORMAL HIGH (ref 2.3–4.6)

## 2010-03-27 LAB — COMPREHENSIVE METABOLIC PANEL
AST: 17 U/L (ref 0–37)
Albumin: 2 g/dL — ABNORMAL LOW (ref 3.5–5.2)
Alkaline Phosphatase: 281 U/L — ABNORMAL HIGH (ref 39–117)
Chloride: 97 mEq/L (ref 96–112)
GFR calc Af Amer: 20 mL/min — ABNORMAL LOW (ref >60)
Potassium: 3.2 mEq/L — ABNORMAL LOW (ref 3.5–5.1)
Total Bilirubin: 0.8 mg/dL (ref 0.3–1.2)
Total Protein: 6.9 g/dL (ref 6.0–8.3)

## 2010-03-27 LAB — TRIGLYCERIDES: Triglycerides: 125 mg/dL (ref <150)

## 2010-03-27 LAB — CHOLESTEROL, TOTAL: Cholesterol: 94 mg/dL (ref 0–200)

## 2010-03-28 LAB — BASIC METABOLIC PANEL
CO2: 25 mEq/L (ref 19–32)
Chloride: 99 mEq/L (ref 96–112)
Creatinine, Ser: 2.42 mg/dL — ABNORMAL HIGH (ref 0.4–1.5)
GFR calc Af Amer: 34 mL/min — ABNORMAL LOW (ref >60)
Glucose, Bld: 127 mg/dL — ABNORMAL HIGH (ref 70–99)

## 2010-03-28 LAB — DIFFERENTIAL
Eosinophils Absolute: 0.1 10*3/uL (ref 0.0–0.7)
Lymphs Abs: 0.7 10*3/uL (ref 0.7–4.0)
Monocytes Absolute: 1.3 10*3/uL — ABNORMAL HIGH (ref 0.1–1.0)
Monocytes Relative: 11 % (ref 3–12)
Neutrophils Relative %: 82 % — ABNORMAL HIGH (ref 43–77)

## 2010-03-28 LAB — CBC
Hemoglobin: 9 g/dL — ABNORMAL LOW (ref 13.0–17.0)
MCH: 30.5 pg (ref 26.0–34.0)
MCV: 86.8 fL (ref 78.0–100.0)
Platelets: 272 10*3/uL (ref 150–400)
RBC: 2.95 MIL/uL — ABNORMAL LOW (ref 4.22–5.81)

## 2010-03-28 LAB — GLUCOSE, CAPILLARY: Glucose-Capillary: 129 mg/dL — ABNORMAL HIGH (ref 70–99)

## 2010-03-29 ENCOUNTER — Inpatient Hospital Stay (HOSPITAL_COMMUNITY): Payer: Medicaid Other

## 2010-03-29 LAB — BASIC METABOLIC PANEL
BUN: 75 mg/dL — ABNORMAL HIGH (ref 6–23)
Creatinine, Ser: 2.79 mg/dL — ABNORMAL HIGH (ref 0.4–1.5)
GFR calc non Af Amer: 24 mL/min — ABNORMAL LOW (ref >60)

## 2010-03-29 LAB — GLUCOSE, CAPILLARY
Glucose-Capillary: 119 mg/dL — ABNORMAL HIGH (ref 70–99)
Glucose-Capillary: 126 mg/dL — ABNORMAL HIGH (ref 70–99)

## 2010-03-29 LAB — DIFFERENTIAL
Basophils Absolute: 0.1 10*3/uL (ref 0.0–0.1)
Basophils Relative: 1 % (ref 0–1)
Eosinophils Relative: 2 % (ref 0–5)
Monocytes Absolute: 1 10*3/uL (ref 0.1–1.0)

## 2010-03-29 LAB — CBC
MCHC: 35.4 g/dL (ref 30.0–36.0)
RDW: 15.1 % (ref 11.5–15.5)

## 2010-03-29 LAB — MAGNESIUM: Magnesium: 1.7 mg/dL (ref 1.5–2.5)

## 2010-03-30 LAB — COMPREHENSIVE METABOLIC PANEL
ALT: 11 U/L (ref 0–53)
AST: 21 U/L (ref 0–37)
Albumin: 1.9 g/dL — ABNORMAL LOW (ref 3.5–5.2)
Alkaline Phosphatase: 285 U/L — ABNORMAL HIGH (ref 39–117)
BUN: 55 mg/dL — ABNORMAL HIGH (ref 6–23)
Chloride: 100 mEq/L (ref 96–112)
GFR calc Af Amer: 35 mL/min — ABNORMAL LOW (ref >60)
Potassium: 3.3 mEq/L — ABNORMAL LOW (ref 3.5–5.1)
Sodium: 134 mEq/L — ABNORMAL LOW (ref 135–145)
Total Bilirubin: 0.6 mg/dL (ref 0.3–1.2)

## 2010-03-30 LAB — DIFFERENTIAL
Basophils Absolute: 0.1 10*3/uL (ref 0.0–0.1)
Eosinophils Absolute: 0.2 10*3/uL (ref 0.0–0.7)
Lymphs Abs: 0.9 10*3/uL (ref 0.7–4.0)
Neutrophils Relative %: 80 % — ABNORMAL HIGH (ref 43–77)

## 2010-03-30 LAB — GLUCOSE, CAPILLARY: Glucose-Capillary: 109 mg/dL — ABNORMAL HIGH (ref 70–99)

## 2010-03-30 LAB — CBC
MCV: 87.5 fL (ref 78.0–100.0)
Platelets: 225 10*3/uL (ref 150–400)
RBC: 2.97 MIL/uL — ABNORMAL LOW (ref 4.22–5.81)
WBC: 11 10*3/uL — ABNORMAL HIGH (ref 4.0–10.5)

## 2010-03-31 LAB — DIFFERENTIAL
Basophils Absolute: 0.1 10*3/uL (ref 0.0–0.1)
Eosinophils Absolute: 0.1 10*3/uL (ref 0.0–0.7)
Eosinophils Relative: 1 % (ref 0–5)
Monocytes Absolute: 0.9 10*3/uL (ref 0.1–1.0)

## 2010-03-31 LAB — CBC
MCHC: 34.3 g/dL (ref 30.0–36.0)
MCV: 87.9 fL (ref 78.0–100.0)
Platelets: 272 10*3/uL (ref 150–400)
RDW: 15.3 % (ref 11.5–15.5)
WBC: 12.8 10*3/uL — ABNORMAL HIGH (ref 4.0–10.5)

## 2010-03-31 LAB — BASIC METABOLIC PANEL
Calcium: 9.5 mg/dL (ref 8.4–10.5)
Creatinine, Ser: 2.55 mg/dL — ABNORMAL HIGH (ref 0.4–1.5)
GFR calc Af Amer: 32 mL/min — ABNORMAL LOW (ref >60)
GFR calc non Af Amer: 26 mL/min — ABNORMAL LOW (ref >60)

## 2010-03-31 LAB — PHOSPHORUS: Phosphorus: 4.4 mg/dL (ref 2.3–4.6)

## 2010-03-31 LAB — MAGNESIUM: Magnesium: 1.9 mg/dL (ref 1.5–2.5)

## 2010-04-01 ENCOUNTER — Inpatient Hospital Stay (HOSPITAL_COMMUNITY): Payer: Medicaid Other

## 2010-04-01 LAB — BASIC METABOLIC PANEL
BUN: 77 mg/dL — ABNORMAL HIGH (ref 6–23)
Chloride: 95 mEq/L — ABNORMAL LOW (ref 96–112)
Creatinine, Ser: 2.68 mg/dL — ABNORMAL HIGH (ref 0.4–1.5)
Glucose, Bld: 118 mg/dL — ABNORMAL HIGH (ref 70–99)

## 2010-04-01 LAB — GLUCOSE, CAPILLARY
Glucose-Capillary: 122 mg/dL — ABNORMAL HIGH (ref 70–99)
Glucose-Capillary: 120 mg/dL — ABNORMAL HIGH (ref 70–99)
Glucose-Capillary: 125 mg/dL — ABNORMAL HIGH (ref 70–99)

## 2010-04-01 LAB — CBC
HCT: 26.8 % — ABNORMAL LOW (ref 39.0–52.0)
Hemoglobin: 9.3 g/dL — ABNORMAL LOW (ref 13.0–17.0)
MCH: 29.7 pg (ref 26.0–34.0)
MCHC: 34.7 g/dL (ref 30.0–36.0)
MCV: 85.6 fL (ref 78.0–100.0)
RBC: 3.13 MIL/uL — ABNORMAL LOW (ref 4.22–5.81)

## 2010-04-01 LAB — DIFFERENTIAL
Basophils Absolute: 0.1 10*3/uL (ref 0.0–0.1)
Basophils Relative: 1 % (ref 0–1)
Lymphocytes Relative: 9 % — ABNORMAL LOW (ref 12–46)
Lymphs Abs: 1.1 10*3/uL (ref 0.7–4.0)
Monocytes Relative: 9 % (ref 3–12)
Neutro Abs: 9.7 10*3/uL — ABNORMAL HIGH (ref 1.7–7.7)

## 2010-04-01 LAB — MAGNESIUM: Magnesium: 1.9 mg/dL (ref 1.5–2.5)

## 2010-04-02 LAB — CBC
Hemoglobin: 9.5 g/dL — ABNORMAL LOW (ref 13.0–17.0)
MCH: 29.3 pg (ref 26.0–34.0)
MCV: 85.8 fL (ref 78.0–100.0)
Platelets: 218 10*3/uL (ref 150–400)
RBC: 3.24 MIL/uL — ABNORMAL LOW (ref 4.22–5.81)
WBC: 11.5 10*3/uL — ABNORMAL HIGH (ref 4.0–10.5)

## 2010-04-02 LAB — BASIC METABOLIC PANEL
CO2: 26 mEq/L (ref 19–32)
Chloride: 97 mEq/L (ref 96–112)
GFR calc Af Amer: 40 mL/min — ABNORMAL LOW (ref >60)
Glucose, Bld: 113 mg/dL — ABNORMAL HIGH (ref 70–99)
Potassium: 4.1 mEq/L (ref 3.5–5.1)
Sodium: 132 mEq/L — ABNORMAL LOW (ref 135–145)

## 2010-04-02 LAB — DIFFERENTIAL
Basophils Relative: 1 % (ref 0–1)
Eosinophils Absolute: 0.1 10*3/uL (ref 0.0–0.7)
Eosinophils Relative: 1 % (ref 0–5)
Lymphocytes Relative: 9 % — ABNORMAL LOW (ref 12–46)
Monocytes Relative: 7 % (ref 3–12)
Neutro Abs: 9.5 10*3/uL — ABNORMAL HIGH (ref 1.7–7.7)
Neutrophils Relative %: 82 % — ABNORMAL HIGH (ref 43–77)

## 2010-04-02 LAB — GLUCOSE, CAPILLARY: Glucose-Capillary: 130 mg/dL — ABNORMAL HIGH (ref 70–99)

## 2010-04-03 LAB — CBC
MCH: 28.8 pg (ref 26.0–34.0)
MCHC: 34.1 g/dL (ref 30.0–36.0)
Platelets: 215 10*3/uL (ref 150–400)

## 2010-04-03 LAB — COMPREHENSIVE METABOLIC PANEL
ALT: 9 U/L (ref 0–53)
Alkaline Phosphatase: 240 U/L — ABNORMAL HIGH (ref 39–117)
Chloride: 96 mEq/L (ref 96–112)
Glucose, Bld: 111 mg/dL — ABNORMAL HIGH (ref 70–99)
Potassium: 3.6 mEq/L (ref 3.5–5.1)
Sodium: 133 mEq/L — ABNORMAL LOW (ref 135–145)
Total Bilirubin: 0.5 mg/dL (ref 0.3–1.2)
Total Protein: 6.3 g/dL (ref 6.0–8.3)

## 2010-04-03 LAB — GLUCOSE, CAPILLARY
Glucose-Capillary: 151 mg/dL — ABNORMAL HIGH (ref 70–99)
Glucose-Capillary: 102 mg/dL — ABNORMAL HIGH (ref 70–99)

## 2010-04-03 LAB — BASIC METABOLIC PANEL
BUN: 53 mg/dL — ABNORMAL HIGH (ref 6–23)
CO2: 26 mEq/L (ref 19–32)
Chloride: 95 mEq/L — ABNORMAL LOW (ref 96–112)
GFR calc non Af Amer: 29 mL/min — ABNORMAL LOW (ref >60)
Glucose, Bld: 109 mg/dL — ABNORMAL HIGH (ref 70–99)
Potassium: 3.6 mEq/L (ref 3.5–5.1)
Sodium: 132 mEq/L — ABNORMAL LOW (ref 135–145)

## 2010-04-03 LAB — TRIGLYCERIDES: Triglycerides: 144 mg/dL (ref <150)

## 2010-04-03 LAB — DIFFERENTIAL
Basophils Absolute: 0 10*3/uL (ref 0.0–0.1)
Basophils Relative: 0 % (ref 0–1)
Eosinophils Absolute: 0.1 10*3/uL (ref 0.0–0.7)
Monocytes Absolute: 0.8 10*3/uL (ref 0.1–1.0)
Monocytes Relative: 8 % (ref 3–12)
Neutro Abs: 8 10*3/uL — ABNORMAL HIGH (ref 1.7–7.7)
Neutrophils Relative %: 81 % — ABNORMAL HIGH (ref 43–77)

## 2010-04-03 LAB — MAGNESIUM: Magnesium: 1.9 mg/dL (ref 1.5–2.5)

## 2010-04-04 LAB — BLOOD GAS, ARTERIAL
MECHVT: 650 mL
PEEP: 5 cmH2O
RATE: 12 resp/min
pCO2 arterial: 27.2 mmHg — ABNORMAL LOW (ref 35.0–45.0)
pH, Arterial: 7.353 (ref 7.350–7.450)

## 2010-04-04 LAB — CBC
Platelets: 279 10*3/uL (ref 150–400)
RDW: 15.1 % (ref 11.5–15.5)
WBC: 12.6 10*3/uL — ABNORMAL HIGH (ref 4.0–10.5)

## 2010-04-04 LAB — DIFFERENTIAL
Basophils Absolute: 0 10*3/uL (ref 0.0–0.1)
Eosinophils Absolute: 0.1 10*3/uL (ref 0.0–0.7)
Eosinophils Relative: 1 % (ref 0–5)
Lymphocytes Relative: 7 % — ABNORMAL LOW (ref 12–46)
Neutrophils Relative %: 83 % — ABNORMAL HIGH (ref 43–77)

## 2010-04-04 LAB — PREALBUMIN: Prealbumin: 8.9 mg/dL — ABNORMAL LOW (ref 17.0–34.0)

## 2010-04-04 LAB — BASIC METABOLIC PANEL
GFR calc non Af Amer: 29 mL/min — ABNORMAL LOW (ref >60)
Potassium: 3.5 mEq/L (ref 3.5–5.1)
Sodium: 133 mEq/L — ABNORMAL LOW (ref 135–145)

## 2010-04-04 LAB — GLUCOSE, CAPILLARY
Glucose-Capillary: 100 mg/dL — ABNORMAL HIGH (ref 70–99)
Glucose-Capillary: 103 mg/dL — ABNORMAL HIGH (ref 70–99)

## 2010-04-04 LAB — MAGNESIUM: Magnesium: 1.9 mg/dL (ref 1.5–2.5)

## 2010-04-05 LAB — BASIC METABOLIC PANEL
BUN: 47 mg/dL — ABNORMAL HIGH (ref 6–23)
Calcium: 8.5 mg/dL (ref 8.4–10.5)
GFR calc non Af Amer: 28 mL/min — ABNORMAL LOW (ref >60)
Glucose, Bld: 87 mg/dL (ref 70–99)
Potassium: 3.8 mEq/L (ref 3.5–5.1)

## 2010-04-05 LAB — CBC
HCT: 25.6 % — ABNORMAL LOW (ref 39.0–52.0)
MCHC: 33.2 g/dL (ref 30.0–36.0)
RDW: 15.2 % (ref 11.5–15.5)

## 2010-04-05 LAB — DIFFERENTIAL
Basophils Absolute: 0 10*3/uL (ref 0.0–0.1)
Eosinophils Relative: 0 % (ref 0–5)
Lymphocytes Relative: 5 % — ABNORMAL LOW (ref 12–46)
Monocytes Absolute: 1.4 10*3/uL — ABNORMAL HIGH (ref 0.1–1.0)

## 2010-04-05 NOTE — Op Note (Signed)
NAMEJEWELL, Peter [name] NO.:  [id number]  MEDICAL RECORD NO.:  [id number]          PATIENT TYPE:  INP  LOCATION:  IC05                          FACILITY:  [facility]  PHYSICIAN:  Tilford Pillar, MD      DATE OF BIRTH:  1950/12/18  DATE OF PROCEDURE:  03/03/2010 DATE OF DISCHARGE:                              OPERATIVE REPORT   ADDENDUM:  The defect was closed with simple interrupted 3-0 silks to obtain control of the gross spillage.  This resulted in satisfactory closure and actually there was some redundancy.  I was able to perform an imbrication over this to reinforce the area.  At this time, I did inspect the remaining portions of the abdomen.  The remainder of the small intestine was evaluated.  There were several interloop fluid collections which were washed and aspirated.  The small bowel did appear hyperemic and had some fibrinous material covering the small intestine, but the bowel itself appeared viable over the entire length.  This included the remainder of the descending sigmoid and rectum.  The stomach was viable and distended.  Nasogastric tube was repositioned and advanced by Anesthesia at this point with good results of repositioning and decompression of the stomach.  The liver was nodular, consistent with his history of cirrhosis cyst and his spleen was intact.  There was fluid in the left lateral gutter.  This again irrigated.  The abdomen was copiously irrigated with sterile saline.  Several areas of the mesentery of the small bowel appeared well.  No active vessel bleeding was noted.  After copiously irrigating the field, I did decide as to the point to place a piece Surgicel again this overall portion of mesentery to help hemostasis.  At this time, I did also bring a tongue of omentum down.  I used the EnSeal to create the tongue of omentum and brought this down, passing this over the anastomotic repair with several 3-0 silks.  The JP drains were  then placed.  Two separate stab incisions in the right and left anterior abdominal wall.  Placement was done using a hemostat.  The left JP was placed along the left paracolic gutter and down into the pelvis.  The right JP drain was placed adjacent to the buttress of the anastomoses and down again into the pelvis.  Both drains were then secured with a 3-0 nylon.  The fascia was then reapproximated using 0 Ethibond sutures in a simple interrupted fashion.  The subcuticular tissue was irrigated.  A quater inch Penrose drain was placed in the subcuticular space and the skin was reapproximated with skin staples along the entirety of length of the incision.  At this point, the abdomen was washed and dried with moist and dry towel. Betadine ointment was placed over the staple line.  The drapes were removed and a dressing was secured.  At this time, I regown and gloves and prepped the left chest wall and neck with chlorhexidine solution.  Sterile drapes were placed and an 18- gauge introducer needle was utilized to identify the left subclavian vein.  The J-wire was advanced using sterile Seldinger technique.  A triple-lumen catheter was placed in standard fashion to 17-1/2 cm.  All ports were aspirated and flushed easily.  The catheter was secured to the skin with a silk suture x3 for redundancy.  Sterile dressing was placed and Tegaderm dressing was utilized to secure the dressing.  The patient tolerated this procedure extremely well.  Central line placement to the surgical procedures.  At this time, the patient was transferred to Intensive Care Unit bed and was transferred to the Intensive Care Unit in stable, but guarded condition.  At the conclusion of the procedure, all instruments, sponge and needle counts were correct.  The patient tolerated the procedure extremely well.     Tilford Pillar, MD     BZ/MEDQ  D:  03/03/2010  T:  03/04/2010  Job:  [id number]  Electronically Signed by  Tilford Pillar MD on 04/05/2010 11:13:26 AM

## 2010-04-05 NOTE — H&P (Signed)
Peter [name], Peter [name] NO.:  [id number]  MEDICAL RECORD NO.:  [id number]          PATIENT TYPE:  AMB  LOCATION:  DAY                           FACILITY:  [facility]  PHYSICIAN:  Tilford Pillar, MD      DATE OF BIRTH:  March 20, 1950  DATE OF ADMISSION:  03/03/2010 DATE OF DISCHARGE:  LH                             HISTORY & PHYSICAL   CHIEF COMPLAINT:  Increasing abdominal pain.  HISTORY OF PRESENT ILLNESS:  The patient is a 60 year old male who was recently discharged from [facility] after undergoing an extended right hemicolectomy which was done laparoscopically hand- assisted for a colon mass.  During his last hospitalization he did undergo delirium tremens during his initial recovery period, but did proceed through this.  On discharge he was tolerating a regular diet, was having bowel function, and was discharged to home on the 11th of this month.  Upon returning home, he noted increasing abdominal pain, distention and started developing increasing nausea and episodes of emesis.  Bowel function was also minimal and it was loose, watery and blood-tinged.  He denies any fevers and chills.  He has had a productive cough.  His wife actually called my partner this morning and discussed and the patient's worsening symptoms at which time it was advised he be brought to the Emergency Department.  As they were preparing to get him to the Emergency Department, he apparently had a moment of lightheadedness and passed out, although he states he remembers the events.  At that point a squad was called and he was brought to the Emergency Department.  His pain is diffuse, it is increased with movement, palpation, and movement of the bed.  PAST MEDICAL HISTORY: 1. History of cirrhosis. 2. EtOH abuse. 3. Colon carcinoma.  PAST SURGICAL HISTORY:  Above-mentioned laparoscopic hand-assisted extended right hemicolectomy.  MEDICATIONS:  Vicodin, pantoprazole, milk  thistle.  ALLERGIES:  No known drug allergies.  SOCIAL HISTORY:  Positive alcohol use.  Positive tobacco use.  Denies any illicit drugs.  FAMILY HISTORY:  Noncontributory.  REVIEW OF SYSTEMS:  CONSTITUTIONAL:  Lightheaded, positive fevers and chills.  EYES:  Unremarkable.  EARS, NOSE AND THROAT:  Unremarkable. RESPIRATORY:  Worsening shortness of breath as per HPI.  CARDIOVASCULAR: Unremarkable.  GASTROINTESTINAL:  As per HPI.  GENITOURINARY:  Decreased voiding per the patient.  MUSCULOSKELETAL:  Unremarkable.  ENDOCRINE: Unremarkable.  NEURO:  Unremarkable.  SKIN:  Unremarkable.  PHYSICAL EXAMINATION:  VITAL SIGNS:  Temperature 100, heart rate 120, respirations 30, blood pressure 100/42, 95% O2 saturation on 2 liters. GENERAL:  The patient is in the supine position on the emergency room cart.  He appears in acute distress.  He is awake, but obviously uncomfortable.  He is dyspneic with work of breathing noted.  Family is present. HEENT:  Scalp--no deformities or masses.  Eyes--pupils equal, round, reactive.  Extraocular movements are intact.  No scleral icterus or conjunctival pallor is noted.  Oral mucosa is pink.  Normal occlusion. NECK:  Trachea is midline.  No cervical lymphadenopathy. PULMONARY:  Coarse breath sounds especially in the upper airway.  Full breath sounds bilaterally.  CARDIOVASCULAR:  Tachycardic, regular rhythm. ABDOMEN:  No bowel sounds.  Abdomen is distended.  Diffuse peritoneal signs are elicited.  No hernias or masses are noted.  His midline previous incisions are intact.  Staples are present.  No significant erythema or ecchymosis noted within the abdominal wall.  No crepitance is noted. EXTREMITIES:  Cool, somewhat clammy.  PERTINENT LABORATORY AND RADIOGRAPHIC STUDIES:  CBC is currently pending. Basic metabolic panel--sodium 136, potassium 3.1, chloride 109, bicarb 13, BUN 31, creatinine 3.59, blood glucose 144, albumin 2.8, calcium 10.4.  CT abdomen  and pelvis demonstrates a large amount of free intra-abdominal air, there is significant amount of fluid within the pelvis, distended bowel loops are noted throughout.  To note, there is a noted nodule in the left superior pole of the kidney suspicious for solid mass, although this does not appear to be acute.  ASSESSMENT/PLAN:  Acute abdomen, suspected anastomotic leak, dehiscence. At this point the physical exam findings and CT findings were discussed with the patient and family, as well as the need for proceeding with an emergent exploratory laparotomy, possible bowel resection, probable ostomy creation.  He has been resuscitated with IV fluid and this is continuing at this point.  Additionally, he is been given Invanz 1 gram IV for empiric coverage.  Type and cross is being obtained and plans are to proceed to a emergent exploratory laparotomy.     Tilford Pillar, MD     BZ/MEDQ  D:  03/03/2010  T:  03/03/2010  Job:  [id number]  Electronically Signed by Tilford Pillar MD on 04/05/2010 11:13:19 AM

## 2010-04-05 NOTE — Op Note (Signed)
Peter [name], PASCH NO.:  [id number]  MEDICAL RECORD NO.:  [id number]          PATIENT TYPE:  INP  LOCATION:  IC05                          FACILITY:  [facility]  PHYSICIAN:  Tilford Pillar, MD      DATE OF BIRTH:  1950-07-09  DATE OF PROCEDURE:  03/03/2010 DATE OF DISCHARGE:                              OPERATIVE REPORT   PREOPERATIVE DIAGNOSIS:  Perforated hollow viscus acute abdomen.  POSTOPERATIVE DIAGNOSIS:  Anastomotic leak.  PROCEDURES: 1. Exploratory laparotomy with oversewing of anastomotic defect,     primary repair, suture repair and omental buttressing. 2. Intra-abdominal drain placements x2.  SURGEON:  Tilford Pillar, MD  ANESTHESIA:  General endotracheal, local anesthetic none.  SPECIMEN:  None.  ESTIMATED BLOOD LOSS:  150 mL.  FLUIDS:  5.5 L of crystalloid, 1 amp of bicarb.  URINE OUTPUT OF THE CASE:  100 mL.  INDICATIONS:  The patient is a 60 year old male who recently underwent a laparoscopic hand extended right hemicolectomy for a colon mass.  He was recently discharged from [facility] after recovering from his initial operation.  His initial recovery was somewhat complicated with episode of delirium tremens during recovery.  He had a slow resumption of bowel function.  Upon discharge, the patient was having bowel function, was tolerating a regular diet.  The patient was controlled on oral analgesia and was ambulatory.  Over the subsequent discharge day, the patient has increasing abdominal pain, worsening shortness of breath, increasing nausea and several episodes of emesis.  The family contacted Dr. Lovell Sheehan who had recommended immediate presentation to the emergency department.  Evaluation in the emergency department was that of an acute abdomen.  A CT of the abdomen and pelvis was obtained which demonstrated a lot of amount of free air and free fluid.  The risks, benefits and alternatives of emergent exploratory laparotomy,  possible bowel resection, probable ostomy were discussed at length with the patient and then the consent was obtained for the emergent procedure.  OPERATION:  The patient was taken to the operating room, and was placed in supine position on the operating table, at which time, the general anesthetic was administered.  Once the patient was asleep, she was endotracheally intubated by Anesthesia.  The patient has already had a nasogastric tube placed prior to presentation to the operating room as well as the Foley catheter already placed.  His abdomen was prepped with Betadine solution and draped in standard fashion.  Skin staples were removed from his previous midline incision at midline trocar site.  At this time, the lower midline incision was connected to the upper midline trocar site with a scalpel.  An additional dissection down through the subcuticular tissue between the 2 sites was carried out using electrocautery.  The fascial spaces were removed in the previous surgery with Mayo scissors, entered into the peritoneal cavity, resulted in a large amount of foul-smelling turbid fluid.  He had initial gush of gas with the peritoneum opening between the 2 previous surgical sites and then began to explore the abdomen starting in the right lower quadrant and significant amount of copious foul-smelling turbid fluid  encountered.  No gross fecal material encountered.  Attention was then turned to the left lower quadrant, where the anastomosis was located. Careful inspection did elicit a defect on the anterior surface of the anastomoses with some discharge coming from the anastomoses.  A 3-0 silks were utilized initially to close the defects to obtain.     Tilford Pillar, MD     BZ/MEDQ  D:  03/03/2010  T:  03/04/2010  Job:  [id number]  Electronically Signed by Tilford Pillar MD on 04/05/2010 11:13:23 AM

## 2010-04-06 ENCOUNTER — Inpatient Hospital Stay (HOSPITAL_COMMUNITY): Payer: Medicaid Other

## 2010-04-06 LAB — COMPREHENSIVE METABOLIC PANEL
AST: 15 U/L (ref 0–37)
BUN: 45 mg/dL — ABNORMAL HIGH (ref 6–23)
CO2: 28 mEq/L (ref 19–32)
Calcium: 8.8 mg/dL (ref 8.4–10.5)
Creatinine, Ser: 2.46 mg/dL — ABNORMAL HIGH (ref 0.4–1.5)
GFR calc Af Amer: 33 mL/min — ABNORMAL LOW (ref >60)
GFR calc non Af Amer: 27 mL/min — ABNORMAL LOW (ref >60)
Glucose, Bld: 94 mg/dL (ref 70–99)

## 2010-04-06 LAB — CBC
Hemoglobin: 8.3 g/dL — ABNORMAL LOW (ref 13.0–17.0)
MCH: 28.6 pg (ref 26.0–34.0)
MCHC: 33.1 g/dL (ref 30.0–36.0)
RDW: 15.4 % (ref 11.5–15.5)

## 2010-04-06 LAB — PHOSPHORUS: Phosphorus: 4.9 mg/dL — ABNORMAL HIGH (ref 2.3–4.6)

## 2010-04-06 LAB — DIFFERENTIAL
Basophils Absolute: 0 10*3/uL (ref 0.0–0.1)
Basophils Relative: 0 % (ref 0–1)
Neutro Abs: 15.7 10*3/uL — ABNORMAL HIGH (ref 1.7–7.7)
Neutrophils Relative %: 86 % — ABNORMAL HIGH (ref 43–77)
WBC Morphology: INCREASED

## 2010-04-06 LAB — MAGNESIUM: Magnesium: 1.8 mg/dL (ref 1.5–2.5)

## 2010-04-07 LAB — CBC
MCH: 28.7 pg (ref 26.0–34.0)
MCV: 86.9 fL (ref 78.0–100.0)
Platelets: 298 10*3/uL (ref 150–400)
RDW: 15.4 % (ref 11.5–15.5)
WBC: 13.8 10*3/uL — ABNORMAL HIGH (ref 4.0–10.5)

## 2010-04-07 LAB — BASIC METABOLIC PANEL
CO2: 27 mEq/L (ref 19–32)
Calcium: 8.7 mg/dL (ref 8.4–10.5)
Creatinine, Ser: 2.42 mg/dL — ABNORMAL HIGH (ref 0.4–1.5)
GFR calc Af Amer: 34 mL/min — ABNORMAL LOW (ref >60)

## 2010-04-07 LAB — DIFFERENTIAL
Basophils Relative: 0 % (ref 0–1)
Eosinophils Absolute: 0 10*3/uL (ref 0.0–0.7)
Monocytes Absolute: 1.3 10*3/uL — ABNORMAL HIGH (ref 0.1–1.0)
Monocytes Relative: 9 % (ref 3–12)
Neutrophils Relative %: 83 % — ABNORMAL HIGH (ref 43–77)

## 2010-04-08 NOTE — Discharge Summary (Signed)
Peter [name], Peter [name] NO.:  [id number]  MEDICAL RECORD NO.:  [id number]           PATIENT TYPE:  I  LOCATION:  A304                          FACILITY:  [facility]  PHYSICIAN:  Dalia Heading, M.D.  DATE OF BIRTH:  [birth year]  DATE OF ADMISSION:  03/03/2010 DATE OF DISCHARGE:  02/17/2012LH                              DISCHARGE SUMMARY   HOSPITAL COURSE SUMMARY:  The patient is a 60 year old white male status post right hemicolectomy for the colon carcinoma on March 27, 2009, who presented to the emergency room on April 03, 2010, after having being discharged several days earlier with worsening abdominal pain.  A CT scan of the abdomen and pelvis was performed, which revealed a large amount of pneumoperitoneum and fluid in the pelvis.  He was taken emergently to the operating room on March 03, 2010, and was found to have an anastomotic leak.  This was repaired primarily and drains were placed.  His subsequent postoperative course was prolonged and complicated due to acute renal failure, ultimate anastomotic leak, sepsis, and prolonged ventilatory-dependent respiratory failure.  Dr. Kristian Covey of Nephrology was consulted, and the patient ultimately required hemodialysis.  His sepsis was able to be controlled, and he subsequently was able to be extubated.  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  He was on prolonged TPN due to the breakdown of the repair.  He subsequently developed a low output fistula and due to concerns of his renal function and electrolyte management, it was decided to advance his oral intake and decrease the TPN.  Over the last week prior to his discharge, his renal function has improved to the point where he has not required dialysis for approximately 1 week.  His BUN is 42 and his creatinine is 2.42.  He still is anemic and  requires Epogen IV.  He has had several CAT scan since that time, which show a stable enterocutaneous fistula.  His diet has improved and is felt that this we will just monitor his fistula before considering any further surgical therapy.  The patient is being discharged home with home health in fair and stable condition.  DISCHARGE MEDICATIONS: 1. K-Dur 20 mEq p.o. b.i.d. x5 days. 2. Hydrocodone 1-2 tablets p.o. q.4 h. p.r.n. pain. 3. Prevacid 40 mg p.o. daily. 4. Milk thistle 240 mg p.o. daily p.r.n. 5. Epogen 10,000 units subcu q. Weekly.  PRINCIPAL DIAGNOSES: 1. Anastomotic leak, status post right hemicolectomy for colon cancer. 2. Breakdown of repair of anastomotic leak. 3. Sepsis, resolved. 4. Acute renal failure, stable. 5. Ventilatory-dependent respiratory failure, resolved. 6. Anemia secondary to chronic disease and acute renal failure. 7. Malnutrition. 8. History of cirrhosis and alcohol withdrawal.  PRINCIPAL PROCEDURES: 1. Exploratory laparotomy, repair of anastomotic leak on March 03, 2010. 2. Placement of Perma-Cath by Vascular Surgery at [facility].     Dalia Heading, M.D.     MAJ/MEDQ  D:  04/07/2010  T:  04/07/2010  Job:  [id number]  cc:   Angus  Edilia Bo, MD Fax: [phone]  Electronically Signed by Franky Macho M.D. on 04/08/2010 06:47:12 PM

## 2010-04-13 ENCOUNTER — Inpatient Hospital Stay (HOSPITAL_COMMUNITY): Payer: Medicaid Other

## 2010-04-13 ENCOUNTER — Ambulatory Visit (HOSPITAL_COMMUNITY): Payer: Self-pay

## 2010-04-13 ENCOUNTER — Ambulatory Visit (HOSPITAL_COMMUNITY): Payer: Medicaid Other

## 2010-04-14 ENCOUNTER — Inpatient Hospital Stay (HOSPITAL_COMMUNITY)
Admission: RE | Admit: 2010-04-14 | Discharge: 2010-04-26 | DRG: 330 | Disposition: A | Discharge status: Home Health | Payer: Medicaid Other | Source: Ambulatory Visit | Attending: General Surgery | Admitting: General Surgery

## 2010-04-14 ENCOUNTER — Observation Stay (HOSPITAL_COMMUNITY): Payer: Medicaid Other

## 2010-04-14 DIAGNOSIS — F1010 Alcohol abuse, uncomplicated: Secondary | ICD-10-CM | POA: Diagnosis present

## 2010-04-14 DIAGNOSIS — K632 Fistula of intestine: Principal | ICD-10-CM | POA: Diagnosis present

## 2010-04-14 DIAGNOSIS — E46 Unspecified protein-calorie malnutrition: Secondary | ICD-10-CM | POA: Diagnosis present

## 2010-04-14 DIAGNOSIS — K7030 Alcoholic cirrhosis of liver without ascites: Secondary | ICD-10-CM | POA: Diagnosis present

## 2010-04-14 DIAGNOSIS — D638 Anemia in other chronic diseases classified elsewhere: Secondary | ICD-10-CM | POA: Diagnosis present

## 2010-04-14 DIAGNOSIS — E876 Hypokalemia: Secondary | ICD-10-CM | POA: Diagnosis not present

## 2010-04-14 DIAGNOSIS — C189 Malignant neoplasm of colon, unspecified: Secondary | ICD-10-CM | POA: Diagnosis present

## 2010-04-14 LAB — DIFFERENTIAL
Lymphocytes Relative: 5 % — ABNORMAL LOW (ref 12–46)
Lymphs Abs: 0.9 10*3/uL (ref 0.7–4.0)
Monocytes Absolute: 1.2 10*3/uL — ABNORMAL HIGH (ref 0.1–1.0)
Monocytes Relative: 8 % (ref 3–12)
Neutro Abs: 13.7 10*3/uL — ABNORMAL HIGH (ref 1.7–7.7)
Neutrophils Relative %: 87 % — ABNORMAL HIGH (ref 43–77)

## 2010-04-14 LAB — BASIC METABOLIC PANEL
BUN: 26 mg/dL — ABNORMAL HIGH (ref 6–23)
CO2: 28 mEq/L (ref 19–32)
Calcium: 8.6 mg/dL (ref 8.4–10.5)
Chloride: 99 mEq/L (ref 96–112)
Creatinine, Ser: 1.99 mg/dL — ABNORMAL HIGH (ref 0.4–1.5)
GFR calc Af Amer: 42 mL/min — ABNORMAL LOW (ref >60)
Glucose, Bld: 118 mg/dL — ABNORMAL HIGH (ref 70–99)

## 2010-04-14 LAB — CBC
HCT: 24.6 % — ABNORMAL LOW (ref 39.0–52.0)
Hemoglobin: 7.8 g/dL — ABNORMAL LOW (ref 13.0–17.0)
MCH: 27.5 pg (ref 26.0–34.0)
MCHC: 31.7 g/dL (ref 30.0–36.0)
MCV: 86.6 fL (ref 78.0–100.0)
RBC: 2.84 MIL/uL — ABNORMAL LOW (ref 4.22–5.81)

## 2010-04-15 LAB — DIFFERENTIAL
Lymphocytes Relative: 11 % — ABNORMAL LOW (ref 12–46)
Lymphs Abs: 1.1 10*3/uL (ref 0.7–4.0)
Monocytes Relative: 7 % (ref 3–12)
Neutro Abs: 8.2 10*3/uL — ABNORMAL HIGH (ref 1.7–7.7)
Neutrophils Relative %: 82 % — ABNORMAL HIGH (ref 43–77)

## 2010-04-15 LAB — BASIC METABOLIC PANEL
BUN: 21 mg/dL (ref 6–23)
CO2: 28 mEq/L (ref 19–32)
Chloride: 100 mEq/L (ref 96–112)
Glucose, Bld: 96 mg/dL (ref 70–99)
Potassium: 4.2 mEq/L (ref 3.5–5.1)

## 2010-04-15 LAB — CBC
HCT: 27.8 % — ABNORMAL LOW (ref 39.0–52.0)
Hemoglobin: 9.1 g/dL — ABNORMAL LOW (ref 13.0–17.0)
MCH: 27.7 pg (ref 26.0–34.0)
MCV: 84.8 fL (ref 78.0–100.0)
RBC: 3.28 MIL/uL — ABNORMAL LOW (ref 4.22–5.81)

## 2010-04-15 LAB — ALBUMIN: Albumin: 1.8 g/dL — ABNORMAL LOW (ref 3.5–5.2)

## 2010-04-15 LAB — MAGNESIUM: Magnesium: 2 mg/dL (ref 1.5–2.5)

## 2010-04-16 LAB — BASIC METABOLIC PANEL
CO2: 25 mEq/L (ref 19–32)
Calcium: 8.2 mg/dL — ABNORMAL LOW (ref 8.4–10.5)
GFR calc Af Amer: 49 mL/min — ABNORMAL LOW (ref >60)
Sodium: 134 mEq/L — ABNORMAL LOW (ref 135–145)

## 2010-04-16 LAB — PREPARE RBC (CROSSMATCH)

## 2010-04-16 LAB — DIFFERENTIAL
Basophils Relative: 0 % (ref 0–1)
Eosinophils Absolute: 0 10*3/uL (ref 0.0–0.7)
Monocytes Absolute: 0.7 10*3/uL (ref 0.1–1.0)
Monocytes Relative: 8 % (ref 3–12)
Neutrophils Relative %: 81 % — ABNORMAL HIGH (ref 43–77)

## 2010-04-16 LAB — CBC
MCH: 27.5 pg (ref 26.0–34.0)
MCHC: 32.4 g/dL (ref 30.0–36.0)
Platelets: 274 10*3/uL (ref 150–400)

## 2010-04-16 NOTE — H&P (Signed)
NAMEMARGARITA, [name] NO.:  [id number]  MEDICAL RECORD NO.:  [id number]           PATIENT TYPE:  I  LOCATION:  A303                          FACILITY:  [facility]  PHYSICIAN:  Dalia Heading, M.D.  DATE OF BIRTH:  1950/04/19  DATE OF ADMISSION:  04/14/2010 DATE OF DISCHARGE:  LH                             HISTORY & PHYSICAL   CHIEF COMPLAINT:  Stool draining from the midline incision.  HISTORY OF PRESENT ILLNESS:  The patient is a 60 year old white male recently discharged on April 07, 2010, after a prolonged hospitalization for treatment of a right hemicolectomy with postoperative anastomotic breakdown.  He had a prolonged course due to acute renal failure and development of an enterocutaneous fistula from breakdown of the anastomosis, which had been repaired by Dr. Leticia Penna.  I saw him in my office yesterday and his left lower quadrant enterocutaneous fistula was draining with an ostomy bag present over. His white blood cell count at that time was 14.5, which had been that way for a week and was minimally elevated from his discharge white blood cell count.  Approximately 1 a.m. this morning, he was noted to have stool leaking from the lower portion of his midline incision and the previous fistula site had stopped draining.  He was brought to the emergency room for further evaluation and treatment.  PAST MEDICAL HISTORY:  As noted above, history of cirrhosis, history of ethanol abuse, colon carcinoma.  MEDICATIONS AT THE TIME OF ADMISSION: 1. Epoetin injection 1000 units subcu q. week. 2. Prevacid 40 mg p.o. daily. 3. Hydrocodone p.r.n. pain. 4. K-Dur, which had been stopped yesterday.  ALLERGIES:  No known drug allergies.  REVIEW OF SYSTEMS:  The patient does have a history of alcohol abuse. Does smoke tobacco.  Denies any illicit drug use.  FAMILY MEDICAL HISTORY:  Noncontributory.  PHYSICAL EXAMINATION:  GENERAL:  The patient is a white male in no  acute distress. VITAL SIGNS:  He is afebrile and vital signs are stable. HEENT:  Unremarkable. LUNGS:  Clear to auscultation with equal breath sounds bilaterally. HEART:  Reveals regular rate and rhythm without S3, S4, murmurs. ABDOMEN:  Soft with an ostomy bag present with stool in the bag over the lower inferior aspect of the midline incision.  The previous fistula site is not draining.  No hepatosplenomegaly, masses, or rigidity are noted.  White blood count 15.8, hemoglobin 7.8, hematocrit 24.6, platelet count 322.  BMET reveals a sodium of 135, potassium 4.4, chloride 99, bicarb 28, BUN 26, creatinine 1.97, glucose 118.  IMPRESSION: 1. Enterocutaneous fistula. 2. History of renal insufficiency. 3. History of cirrhosis. 4. Status post right hemicolectomy for colon carcinoma.  PLAN:  The patient will be admitted to the hospital for further evaluation and treatment of this new enterocutaneous fistula.  CT scan of the abdomen and pelvis has been ordered.  Pending those results, he may need to go back to the operating room for placement of an ileostomy and clean out of his wound.  This has been explained to the family. This does not require an emergent procedure as his abdomen is soft  and there is no significant evidence of peritonitis.  He will be transfused 2 units of packed red blood cells.     Dalia Heading, M.D.     MAJ/MEDQ  D:  04/14/2010  T:  04/14/2010  Job:  [id number]  Electronically Signed by Franky Macho M.D. on 04/16/2010 10:02:21 AM

## 2010-04-17 ENCOUNTER — Other Ambulatory Visit: Payer: Self-pay | Admitting: General Surgery

## 2010-04-17 LAB — BASIC METABOLIC PANEL
BUN: 14 mg/dL (ref 6–23)
CO2: 26 mEq/L (ref 19–32)
Calcium: 8.2 mg/dL — ABNORMAL LOW (ref 8.4–10.5)
Creatinine, Ser: 1.75 mg/dL — ABNORMAL HIGH (ref 0.4–1.5)
Glucose, Bld: 85 mg/dL (ref 70–99)

## 2010-04-17 LAB — DIFFERENTIAL
Eosinophils Absolute: 0 10*3/uL (ref 0.0–0.7)
Eosinophils Relative: 1 % (ref 0–5)
Lymphs Abs: 1.1 10*3/uL (ref 0.7–4.0)
Monocytes Absolute: 0.8 10*3/uL (ref 0.1–1.0)
Monocytes Relative: 10 % (ref 3–12)

## 2010-04-17 LAB — CBC
MCH: 27.5 pg (ref 26.0–34.0)
MCV: 83.5 fL (ref 78.0–100.0)
Platelets: 252 10*3/uL (ref 150–400)
RBC: 3.57 MIL/uL — ABNORMAL LOW (ref 4.22–5.81)
RDW: 15.7 % — ABNORMAL HIGH (ref 11.5–15.5)

## 2010-04-18 HISTORY — PX: EXPLORATORY LAPAROTOMY W/ BOWEL RESECTION: SHX1544

## 2010-04-18 LAB — BASIC METABOLIC PANEL
BUN: 16 mg/dL (ref 6–23)
Chloride: 109 mEq/L (ref 96–112)
Potassium: 4.7 mEq/L (ref 3.5–5.1)

## 2010-04-18 LAB — CROSSMATCH
Antibody Screen: NEGATIVE
Unit division: 0
Unit division: 0
Unit division: 0
Unit division: 0

## 2010-04-18 LAB — CBC
Hemoglobin: 7.5 g/dL — ABNORMAL LOW (ref 13.0–17.0)
RBC: 2.63 MIL/uL — ABNORMAL LOW (ref 4.22–5.81)
WBC: 31.7 10*3/uL — ABNORMAL HIGH (ref 4.0–10.5)

## 2010-04-18 LAB — ALBUMIN: Albumin: 1.8 g/dL — ABNORMAL LOW (ref 3.5–5.2)

## 2010-04-18 LAB — PHOSPHORUS: Phosphorus: 5.8 mg/dL — ABNORMAL HIGH (ref 2.3–4.6)

## 2010-04-18 LAB — DIFFERENTIAL
Basophils Absolute: 0 10*3/uL (ref 0.0–0.1)
Lymphocytes Relative: 5 % — ABNORMAL LOW (ref 12–46)
Monocytes Absolute: 1.3 10*3/uL — ABNORMAL HIGH (ref 0.1–1.0)
Neutro Abs: 28.8 10*3/uL — ABNORMAL HIGH (ref 1.7–7.7)
WBC Morphology: INCREASED

## 2010-04-18 LAB — HEMOGLOBIN AND HEMATOCRIT, BLOOD
HCT: 30.5 % — ABNORMAL LOW (ref 39.0–52.0)
Hemoglobin: 10.6 g/dL — ABNORMAL LOW (ref 13.0–17.0)

## 2010-04-18 LAB — MAGNESIUM: Magnesium: 1.3 mg/dL — ABNORMAL LOW (ref 1.5–2.5)

## 2010-04-18 NOTE — Op Note (Signed)
NAMEEARNESTINE, TUOHEY NO.:  [id number]  MEDICAL RECORD NO.:  [id number]           PATIENT TYPE:  I  LOCATION:  IC04                          FACILITY:  [facility]  PHYSICIAN:  Dalia Heading, M.D.  DATE OF BIRTH:  1950-09-06  DATE OF PROCEDURE:  04/17/2010 DATE OF DISCHARGE:                              OPERATIVE REPORT   PREOPERATIVE DIAGNOSIS:  Enterocutaneous fistula.  POSTOPERATIVE DIAGNOSIS:  Enterocutaneous fistula.  PROCEDURE:  Exploratory laparotomy, partial small-bowel resection with enterostomy.  SURGEON:  Dalia Heading, MD  ANESTHESIA:  General endotracheal.  INDICATIONS:  The patient is a 60 year old white male who has had a prolonged hospitalization in the past for treatment of an anastomotic breakdown after a right hemicolectomy.  He had acute renal failure and developed an enterocutaneous fistula after the anastomotic breakdown was repaired.  The patient started to have stool coming out of his lower midline incision and now presents for an exploratory laparotomy and placement of an enterostomy.  The risks and benefits of the procedure were fully explained to the patient's, gave informed consent.  PROCEDURE NOTE:  The patient was placed in supine position.  After induction of general endotracheal anesthesia, the abdomen was prepped and draped using the usual sterile technique with Betadine.  Surgical site confirmation was performed.  The previous midline incision was opened.  Upon entering the peritoneal cavity, there was significant adhesive disease with multiple pockets of peritoneal fluid.  There was a fistulous tract towards the anastomotic area in the right lower quadrant.  It was difficult to fully ascertain where the leak was.  The small bowel was mobilized from the proximal portion of the small intestine down to the terminal ileum.  There was significant amount of omentum adhesive disease around the ileocolic anastomosis.  An  enterotomy was made more proximal to this.  It was elected to proceed with defunctionalization of the anastomotic area and approximately 2 feet of small intestine was resected.  GIA staplers were placed proximally and distally along the segment.  The mesentery was divided using the LigaSure.  The specimen was sent to pathology for further examination.  An end-enterostomy was then formed to the right lower quadrant.  This was matured using a 3-0 chromic gut suture.  The abdominal cavity was then copiously irrigated with warm normal saline. A cirrhotic liver was found.  It was felt that should the patient be reconnected down the road, this would be more simply approached by doing a small bowel to small bowel anastomosis after confirmation that the anastomosis is patent and without leakage.  The Davol drains were placed along the left and right paracolic gutters.  Both were brought through separate stab wounds.  The fascia was reapproximated using a #1 Prolene interrupted suture.  A wound VAC was then placed in the subcutaneous tissue.  All tape and needle counts correct at the end of the procedure.  The patient was extubated in the operating room and went back to recovery room in guarded but stable condition.  COMPLICATIONS:  None.  SPECIMEN:  Small bowel.  BLOOD LOSS:  500 mL.  DRAINS:  Jillyn Ledger  drains to right and left paracolic gutters.     Dalia Heading, M.D.     MAJ/MEDQ  D:  04/17/2010  T:  04/18/2010  Job:  [id number]  Electronically Signed by Franky Macho M.D. on 04/18/2010 08:59:13 AM

## 2010-04-19 LAB — PHOSPHORUS: Phosphorus: 5 mg/dL — ABNORMAL HIGH (ref 2.3–4.6)

## 2010-04-19 LAB — DIFFERENTIAL
Basophils Absolute: 0 10*3/uL (ref 0.0–0.1)
Eosinophils Absolute: 0 10*3/uL (ref 0.0–0.7)
Lymphocytes Relative: 7 % — ABNORMAL LOW (ref 12–46)
Lymphs Abs: 1.4 10*3/uL (ref 0.7–4.0)
Neutrophils Relative %: 88 % — ABNORMAL HIGH (ref 43–77)

## 2010-04-19 LAB — CBC
HCT: 25.7 % — ABNORMAL LOW (ref 39.0–52.0)
MCV: 85.4 fL (ref 78.0–100.0)
Platelets: 225 10*3/uL (ref 150–400)
RBC: 3.01 MIL/uL — ABNORMAL LOW (ref 4.22–5.81)
WBC: 21.4 10*3/uL — ABNORMAL HIGH (ref 4.0–10.5)

## 2010-04-19 LAB — BASIC METABOLIC PANEL
Calcium: 8.2 mg/dL — ABNORMAL LOW (ref 8.4–10.5)
GFR calc Af Amer: 50 mL/min — ABNORMAL LOW (ref >60)
GFR calc non Af Amer: 41 mL/min — ABNORMAL LOW (ref >60)
Glucose, Bld: 110 mg/dL — ABNORMAL HIGH (ref 70–99)
Potassium: 4.1 mEq/L (ref 3.5–5.1)
Sodium: 139 mEq/L (ref 135–145)

## 2010-04-20 ENCOUNTER — Ambulatory Visit (HOSPITAL_COMMUNITY): Payer: Self-pay

## 2010-04-20 ENCOUNTER — Encounter (INDEPENDENT_AMBULATORY_CARE_PROVIDER_SITE_OTHER): Payer: Self-pay | Admitting: *Deleted

## 2010-04-20 LAB — DIFFERENTIAL
Eosinophils Absolute: 0 10*3/uL (ref 0.0–0.7)
Eosinophils Relative: 0 % (ref 0–5)
Lymphs Abs: 0.7 10*3/uL (ref 0.7–4.0)
Monocytes Relative: 4 % (ref 3–12)

## 2010-04-20 LAB — BASIC METABOLIC PANEL
BUN: 17 mg/dL (ref 6–23)
Calcium: 8.4 mg/dL (ref 8.4–10.5)
Creatinine, Ser: 1.62 mg/dL — ABNORMAL HIGH (ref 0.4–1.5)
GFR calc non Af Amer: 44 mL/min — ABNORMAL LOW (ref >60)
Glucose, Bld: 92 mg/dL (ref 70–99)

## 2010-04-20 LAB — CBC
MCH: 28.9 pg (ref 26.0–34.0)
MCV: 87 fL (ref 78.0–100.0)
Platelets: 215 10*3/uL (ref 150–400)
RDW: 15.8 % — ABNORMAL HIGH (ref 11.5–15.5)
WBC: 13 10*3/uL — ABNORMAL HIGH (ref 4.0–10.5)

## 2010-04-21 ENCOUNTER — Encounter: Payer: Self-pay | Admitting: Internal Medicine

## 2010-04-21 LAB — ALBUMIN: Albumin: 2.2 g/dL — ABNORMAL LOW (ref 3.5–5.2)

## 2010-04-21 LAB — DIFFERENTIAL
Basophils Relative: 0 % (ref 0–1)
Eosinophils Absolute: 0 10*3/uL (ref 0.0–0.7)
Monocytes Absolute: 0.6 10*3/uL (ref 0.1–1.0)
Monocytes Relative: 7 % (ref 3–12)

## 2010-04-21 LAB — BASIC METABOLIC PANEL
BUN: 16 mg/dL (ref 6–23)
Chloride: 105 mEq/L (ref 96–112)
Creatinine, Ser: 1.5 mg/dL (ref 0.4–1.5)
Glucose, Bld: 103 mg/dL — ABNORMAL HIGH (ref 70–99)

## 2010-04-21 LAB — MAGNESIUM: Magnesium: 1.8 mg/dL (ref 1.5–2.5)

## 2010-04-21 LAB — CBC
MCH: 28.9 pg (ref 26.0–34.0)
MCHC: 33.1 g/dL (ref 30.0–36.0)
Platelets: 214 10*3/uL (ref 150–400)

## 2010-04-22 ENCOUNTER — Other Ambulatory Visit: Payer: Self-pay

## 2010-04-22 LAB — CROSSMATCH
ABO/RH(D): B POS
Antibody Screen: NEGATIVE
Unit division: 0

## 2010-04-22 LAB — BASIC METABOLIC PANEL
BUN: 12 mg/dL (ref 6–23)
CO2: 28 mEq/L (ref 19–32)
Calcium: 8.3 mg/dL — ABNORMAL LOW (ref 8.4–10.5)
Creatinine, Ser: 1.43 mg/dL (ref 0.4–1.5)
GFR calc Af Amer: >60 mL/min (ref >60)
Glucose, Bld: 94 mg/dL (ref 70–99)

## 2010-04-22 LAB — DIFFERENTIAL
Basophils Absolute: 0 10*3/uL (ref 0.0–0.1)
Eosinophils Relative: 0 % (ref 0–5)
Lymphocytes Relative: 14 % (ref 12–46)
Monocytes Absolute: 0.5 10*3/uL (ref 0.1–1.0)
Monocytes Relative: 7 % (ref 3–12)

## 2010-04-22 LAB — CBC
HCT: 22.4 % — ABNORMAL LOW (ref 39.0–52.0)
MCH: 29.4 pg (ref 26.0–34.0)
MCHC: 33.5 g/dL (ref 30.0–36.0)
MCV: 87.8 fL (ref 78.0–100.0)
RDW: 15.3 % (ref 11.5–15.5)

## 2010-04-23 ENCOUNTER — Other Ambulatory Visit: Payer: Self-pay

## 2010-04-23 LAB — DIFFERENTIAL
Band Neutrophils: 0 % (ref 0–10)
Basophils Absolute: 0 10*3/uL (ref 0.0–0.1)
Basophils Relative: 0 % (ref 0–1)
Eosinophils Absolute: 0 10*3/uL (ref 0.0–0.7)
Eosinophils Relative: 0 % (ref 0–5)
Metamyelocytes Relative: 0 %
Monocytes Absolute: 0.5 10*3/uL (ref 0.1–1.0)
Monocytes Relative: 5 % (ref 3–12)
Myelocytes: 0 %

## 2010-04-23 LAB — GLUCOSE, CAPILLARY: Glucose-Capillary: 97 mg/dL (ref 70–99)

## 2010-04-23 LAB — CBC
Platelets: 197 10*3/uL (ref 150–400)
RBC: 2.87 MIL/uL — ABNORMAL LOW (ref 4.22–5.81)
RDW: 15.3 % (ref 11.5–15.5)
WBC: 9.4 10*3/uL (ref 4.0–10.5)

## 2010-04-23 LAB — BASIC METABOLIC PANEL
BUN: 9 mg/dL (ref 6–23)
Chloride: 107 mEq/L (ref 96–112)
Creatinine, Ser: 1.46 mg/dL (ref 0.4–1.5)
GFR calc Af Amer: >60 mL/min (ref >60)
GFR calc non Af Amer: 50 mL/min — ABNORMAL LOW (ref >60)
Potassium: 3.4 mEq/L — ABNORMAL LOW (ref 3.5–5.1)

## 2010-04-24 LAB — COMPREHENSIVE METABOLIC PANEL
ALT: <8 U/L (ref 0–53)
Albumin: 2.3 g/dL — ABNORMAL LOW (ref 3.5–5.2)
Alkaline Phosphatase: 148 U/L — ABNORMAL HIGH (ref 39–117)
Chloride: 103 mEq/L (ref 96–112)
Glucose, Bld: 88 mg/dL (ref 70–99)
Potassium: 3.1 mEq/L — ABNORMAL LOW (ref 3.5–5.1)
Sodium: 141 mEq/L (ref 135–145)
Total Bilirubin: 1.1 mg/dL (ref 0.3–1.2)
Total Protein: 5.4 g/dL — ABNORMAL LOW (ref 6.0–8.3)

## 2010-04-24 LAB — DIFFERENTIAL
Basophils Absolute: 0 10*3/uL (ref 0.0–0.1)
Eosinophils Relative: 0 % (ref 0–5)
Lymphocytes Relative: 12 % (ref 12–46)
Lymphs Abs: 1.1 10*3/uL (ref 0.7–4.0)
Neutrophils Relative %: 81 % — ABNORMAL HIGH (ref 43–77)

## 2010-04-24 LAB — CBC
HCT: 26.9 % — ABNORMAL LOW (ref 39.0–52.0)
MCV: 88.2 fL (ref 78.0–100.0)
Platelets: 210 10*3/uL (ref 150–400)
RBC: 3.05 MIL/uL — ABNORMAL LOW (ref 4.22–5.81)
RDW: 15.5 % (ref 11.5–15.5)
WBC: 9.2 10*3/uL (ref 4.0–10.5)

## 2010-04-24 LAB — GLUCOSE, CAPILLARY
Glucose-Capillary: 95 mg/dL (ref 70–99)
Glucose-Capillary: 99 mg/dL (ref 70–99)

## 2010-04-25 LAB — DIFFERENTIAL
Basophils Absolute: 0 10*3/uL (ref 0.0–0.1)
Lymphocytes Relative: 14 % (ref 12–46)
Monocytes Absolute: 0.4 10*3/uL (ref 0.1–1.0)
Neutro Abs: 5.9 10*3/uL (ref 1.7–7.7)

## 2010-04-25 LAB — COMPREHENSIVE METABOLIC PANEL
ALT: <8 U/L (ref 0–53)
Alkaline Phosphatase: 134 U/L — ABNORMAL HIGH (ref 39–117)
CO2: 28 mEq/L (ref 19–32)
Chloride: 101 mEq/L (ref 96–112)
GFR calc non Af Amer: 49 mL/min — ABNORMAL LOW (ref >60)
Glucose, Bld: 91 mg/dL (ref 70–99)
Potassium: 3.2 mEq/L — ABNORMAL LOW (ref 3.5–5.1)
Sodium: 135 mEq/L (ref 135–145)
Total Bilirubin: 0.9 mg/dL (ref 0.3–1.2)

## 2010-04-25 LAB — CBC
HCT: 23.7 % — ABNORMAL LOW (ref 39.0–52.0)
Hemoglobin: 7.9 g/dL — ABNORMAL LOW (ref 13.0–17.0)
MCHC: 33.3 g/dL (ref 30.0–36.0)
WBC: 7.4 10*3/uL (ref 4.0–10.5)

## 2010-04-25 LAB — MAGNESIUM: Magnesium: 1.7 mg/dL (ref 1.5–2.5)

## 2010-04-26 LAB — DIFFERENTIAL
Basophils Absolute: 0 10*3/uL (ref 0.0–0.1)
Basophils Relative: 0 % (ref 0–1)
Monocytes Absolute: 0.5 10*3/uL (ref 0.1–1.0)
Neutro Abs: 6.2 10*3/uL (ref 1.7–7.7)
Neutrophils Relative %: 78 % — ABNORMAL HIGH (ref 43–77)

## 2010-04-26 LAB — BASIC METABOLIC PANEL
CO2: 29 mEq/L (ref 19–32)
Calcium: 8.5 mg/dL (ref 8.4–10.5)
GFR calc Af Amer: >60 mL/min (ref >60)
GFR calc non Af Amer: 51 mL/min — ABNORMAL LOW (ref >60)
Sodium: 143 mEq/L (ref 135–145)

## 2010-04-26 LAB — CBC
Hemoglobin: 8 g/dL — ABNORMAL LOW (ref 13.0–17.0)
MCHC: 32.4 g/dL (ref 30.0–36.0)

## 2010-04-27 NOTE — Letter (Signed)
Summary: Recall, Labs Needed  [facility]  [address]   [place], [place] [address]   Phone: [phone]  Fax: [phone]    April 20, 2010  Peter [name] [address] [place], [place]  [address] 1950/03/03   Dear [name],   Our records indicate it is time to repeat your blood work.  You can take the enclosed form to the lab on or near the date indicated.  Please make note of the new location of the lab:   [address], 2nd floor   [facility]  Our office will call you within a week to ten business days with the results.  If you do not hear from [place] in 10 business days, you should call the office.  If you have any questions regarding this, call the office at [phone], and ask for the nurse.  Labs are due on 21 April 2010   Sincerely,    Carolan Clines LPN  [facility] Ph: [phone]   Fax: [phone]

## 2010-04-27 NOTE — Letter (Signed)
Summary: Recall, Labs Needed  [facility]  [address]   [place], [place] [address]   Phone: [phone]  Fax: [phone]    April 20, 2010  Peter [name] [address] [place], [place]  [address] [birth year]   Dear Peter [name],   Our records indicate it is time to repeat your blood work.  You can take the enclosed form to the lab on or near the date indicated.  Please make note of the new location of the lab:   [address], 2nd floor   [facility]  Our office will call you within a week to ten business days with the results.  If you do not hear from [place] in 10 business days, you should call the office.  If you have any questions regarding this, call the office at [phone], and ask for the nurse.  Labs are due on 08 May 2010  Sincerely,    Carolan Clines LPN  [facility] Ph: [phone]   Fax: [phone]

## 2010-04-27 NOTE — Letter (Signed)
Summary: DISABILTIY DETERMINATION  DISABILTIY DETERMINATION   Imported By: Rexene Alberts 04/21/2010 11:24:01  _____________________________________________________________________  External Attachment:    Type:   Image     Comment:   External Document

## 2010-04-28 NOTE — Discharge Summary (Signed)
  [name], Peter [name] NO.:  [id number]  MEDICAL RECORD NO.:  [id number]           PATIENT TYPE:  I  LOCATION:  A335                          FACILITY:  [facility]  PHYSICIAN:  Dalia Heading, M.D.  DATE OF BIRTH:  [birth year]  DATE OF ADMISSION:  04/14/2010 DATE OF DISCHARGE:  03/07/2012LH                              DISCHARGE SUMMARY   HOSPITAL COURSE SUMMARY:  The patient is a 60 year old white male who was admitted on April 14, 2010, with stool draining from a midline incision.  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  He was admitted to the hospital by Dr. Leticia Penna for further evaluation and treatment.  A CT scan of the abdomen and pelvis did not reveal any significant intra-abdominal abscess.  The fistulous tract was contained within the muscle and tracked along the lower part of the abdominal wall.  He then was taken to the operating room on April 17, 2010, and underwent exploratory laparotomy with a partial small bowel resection with enterostomy.  It was felt that he had failed previous medical therapy for the enterocutaneous fistula and he had to be diverted in order to help close up the anastomotic leak that occurred previously during his subtotal colectomy.  He tolerated the procedure remarkably well.  His diet was advanced without difficulty once his bowel function returned.  He did have a wound vac applied to the midline incision.  He had Davol drains placed on the left and right paracolic gutters.  Once the ileostomy was functioning, his diet was advanced without difficulty.  His renal function remained normal.  He did have episodes of hypokalemia, which were treated with potassium supplementation.  He was noted to be anemic and this was felt to be secondary to his history of recent renal insufficiency and surgery.  The patient is being discharged home on postoperative day #9 in good  and improving condition.  DISCHARGE INSTRUCTIONS:  The patient is to follow up with Dr. Franky Macho on May 04, 2010.  DISCHARGE MEDICATIONS:  K-Dur 20 mEq p.o. daily, Xanax 0.25 mg p.o. b.i.d. p.r.n. anxiety, erythropoietin injections 1000 units subcu weekly, Vicodin 1-2 tablets p.o. q.4 h. p.r.n. pain, Prevacid 40 mg p.o. daily.  PRINCIPAL DIAGNOSES: 1. Enterocutaneous fistula. 2. History of renal insufficiency/failure. 3. History of alcohol abuse. 4. History of anastomotic breakdown. 5. History of colon cancer. 6. Anemia.  PRINCIPAL PROCEDURE:  Exploratory laparotomy, partial small bowel resection with ileostomy on April 17, 2010.     Dalia Heading, M.D.     MAJ/MEDQ  D:  04/26/2010  T:  04/27/2010  Job:  [id number]  Electronically Signed by Franky Macho M.D. on 04/27/2010 09:53:44 PM

## 2010-05-01 LAB — COMPREHENSIVE METABOLIC PANEL
AST: 84 U/L — ABNORMAL HIGH (ref 0–37)
Albumin: 3.4 g/dL — ABNORMAL LOW (ref 3.5–5.2)
Alkaline Phosphatase: 124 U/L — ABNORMAL HIGH (ref 39–117)
Chloride: 105 mEq/L (ref 96–112)
GFR calc Af Amer: >60 mL/min (ref >60)
Potassium: 3.7 mEq/L (ref 3.5–5.1)
Sodium: 142 mEq/L (ref 135–145)
Total Bilirubin: 0.8 mg/dL (ref 0.3–1.2)

## 2010-05-01 LAB — CROSSMATCH
ABO/RH(D): B POS
Antibody Screen: NEGATIVE
Unit division: 0
Unit division: 0

## 2010-05-01 LAB — CBC
Hemoglobin: 12 g/dL — ABNORMAL LOW (ref 13.0–17.0)
MCH: 34.1 pg — ABNORMAL HIGH (ref 26.0–34.0)
RBC: 3.52 MIL/uL — ABNORMAL LOW (ref 4.22–5.81)
WBC: 6.7 10*3/uL (ref 4.0–10.5)

## 2010-05-01 LAB — CEA: CEA: 2.1 ng/mL (ref 0.0–5.0)

## 2010-05-01 LAB — SURGICAL PCR SCREEN: MRSA, PCR: NEGATIVE

## 2010-05-01 LAB — ABO/RH: ABO/RH(D): B POS

## 2010-05-02 LAB — HEPATIC FUNCTION PANEL
ALT: 51 U/L (ref 0–53)
AST: 111 U/L — ABNORMAL HIGH (ref 0–37)
Indirect Bilirubin: 1.8 mg/dL — ABNORMAL HIGH (ref 0.3–0.9)
Total Protein: 7.2 g/dL (ref 6.0–8.3)

## 2010-05-02 LAB — CBC
HCT: 36.3 % — ABNORMAL LOW (ref 39.0–52.0)
Hemoglobin: 13.2 g/dL (ref 13.0–17.0)
MCHC: 36.4 g/dL — ABNORMAL HIGH (ref 30.0–36.0)
RBC: 3.66 MIL/uL — ABNORMAL LOW (ref 4.22–5.81)

## 2010-05-02 LAB — URINALYSIS, ROUTINE W REFLEX MICROSCOPIC
Glucose, UA: NEGATIVE mg/dL
Specific Gravity, Urine: >1.03 — ABNORMAL HIGH (ref 1.005–1.030)
Urobilinogen, UA: 2 mg/dL — ABNORMAL HIGH (ref 0.0–1.0)
pH: 6 (ref 5.0–8.0)

## 2010-05-02 LAB — BASIC METABOLIC PANEL
CO2: 23 mEq/L (ref 19–32)
GFR calc Af Amer: >60 mL/min (ref >60)
GFR calc non Af Amer: >60 mL/min (ref >60)
Glucose, Bld: 116 mg/dL — ABNORMAL HIGH (ref 70–99)
Potassium: 3.8 mEq/L (ref 3.5–5.1)
Sodium: 139 mEq/L (ref 135–145)

## 2010-05-02 LAB — DIFFERENTIAL
Basophils Relative: 0 % (ref 0–1)
Eosinophils Absolute: 0 10*3/uL (ref 0.0–0.7)
Eosinophils Relative: 0 % (ref 0–5)
Lymphocytes Relative: 9 % — ABNORMAL LOW (ref 12–46)
Neutro Abs: 4.9 10*3/uL (ref 1.7–7.7)

## 2010-05-02 LAB — URINE MICROSCOPIC-ADD ON

## 2010-05-02 LAB — URINE CULTURE
Colony Count: NO GROWTH
Culture: NO GROWTH

## 2010-05-02 LAB — POCT CARDIAC MARKERS: Myoglobin, poc: 45.5 ng/mL (ref 12–200)

## 2010-05-04 ENCOUNTER — Ambulatory Visit (HOSPITAL_COMMUNITY): Payer: Self-pay

## 2010-05-04 ENCOUNTER — Encounter (HOSPITAL_COMMUNITY): Payer: Medicaid Other | Attending: Oncology

## 2010-05-04 DIAGNOSIS — N289 Disorder of kidney and ureter, unspecified: Secondary | ICD-10-CM | POA: Insufficient documentation

## 2010-05-04 DIAGNOSIS — D649 Anemia, unspecified: Secondary | ICD-10-CM | POA: Insufficient documentation

## 2010-05-11 ENCOUNTER — Ambulatory Visit (HOSPITAL_COMMUNITY): Payer: Self-pay

## 2010-07-31 ENCOUNTER — Other Ambulatory Visit (HOSPITAL_COMMUNITY): Payer: Self-pay | Admitting: Oncology

## 2010-07-31 ENCOUNTER — Encounter (HOSPITAL_COMMUNITY): Payer: Medicaid Other | Attending: Oncology | Admitting: Oncology

## 2010-07-31 DIAGNOSIS — N289 Disorder of kidney and ureter, unspecified: Secondary | ICD-10-CM | POA: Insufficient documentation

## 2010-07-31 DIAGNOSIS — D649 Anemia, unspecified: Secondary | ICD-10-CM | POA: Insufficient documentation

## 2010-07-31 DIAGNOSIS — C189 Malignant neoplasm of colon, unspecified: Secondary | ICD-10-CM

## 2010-07-31 DIAGNOSIS — D638 Anemia in other chronic diseases classified elsewhere: Secondary | ICD-10-CM

## 2010-07-31 DIAGNOSIS — N189 Chronic kidney disease, unspecified: Secondary | ICD-10-CM

## 2010-07-31 LAB — CBC
MCH: 30.7 pg (ref 26.0–34.0)
Platelets: 364 10*3/uL (ref 150–400)
RBC: 3.39 MIL/uL — ABNORMAL LOW (ref 4.22–5.81)
WBC: 15.5 10*3/uL — ABNORMAL HIGH (ref 4.0–10.5)

## 2010-07-31 LAB — COMPREHENSIVE METABOLIC PANEL
Albumin: 3.3 g/dL — ABNORMAL LOW (ref 3.5–5.2)
Alkaline Phosphatase: 214 U/L — ABNORMAL HIGH (ref 39–117)
BUN: 23 mg/dL (ref 6–23)
Calcium: 8.1 mg/dL — ABNORMAL LOW (ref 8.4–10.5)
Potassium: 4.2 mEq/L (ref 3.5–5.1)
Total Protein: 8 g/dL (ref 6.0–8.3)

## 2010-07-31 LAB — DIFFERENTIAL
Basophils Relative: 0 % (ref 0–1)
Eosinophils Absolute: 0.1 10*3/uL (ref 0.0–0.7)
Monocytes Relative: 6 % (ref 3–12)
Neutrophils Relative %: 76 % (ref 43–77)

## 2010-07-31 LAB — RETICULOCYTES
Retic Count, Absolute: 67.8 10*3/uL (ref 19.0–186.0)
Retic Ct Pct: 2 % (ref 0.4–3.1)

## 2010-08-01 ENCOUNTER — Inpatient Hospital Stay (HOSPITAL_COMMUNITY): Payer: Medicaid Other

## 2010-08-01 ENCOUNTER — Encounter: Payer: Self-pay | Admitting: Gastroenterology

## 2010-08-01 ENCOUNTER — Ambulatory Visit (INDEPENDENT_AMBULATORY_CARE_PROVIDER_SITE_OTHER): Payer: Medicaid Other | Admitting: Gastroenterology

## 2010-08-01 ENCOUNTER — Inpatient Hospital Stay (HOSPITAL_COMMUNITY)
Admission: RE | Admit: 2010-08-01 | Discharge: 2010-08-07 | DRG: 372 | Disposition: A | Discharge status: Home Health | Payer: Medicaid Other | Source: Ambulatory Visit | Attending: Family Medicine | Admitting: Family Medicine

## 2010-08-01 DIAGNOSIS — Z85038 Personal history of other malignant neoplasm of large intestine: Secondary | ICD-10-CM

## 2010-08-01 DIAGNOSIS — E871 Hypo-osmolality and hyponatremia: Secondary | ICD-10-CM | POA: Diagnosis present

## 2010-08-01 DIAGNOSIS — Z933 Colostomy status: Secondary | ICD-10-CM

## 2010-08-01 DIAGNOSIS — K651 Peritoneal abscess: Principal | ICD-10-CM | POA: Diagnosis present

## 2010-08-01 DIAGNOSIS — E860 Dehydration: Secondary | ICD-10-CM | POA: Insufficient documentation

## 2010-08-01 DIAGNOSIS — D649 Anemia, unspecified: Secondary | ICD-10-CM | POA: Diagnosis present

## 2010-08-01 DIAGNOSIS — E876 Hypokalemia: Secondary | ICD-10-CM | POA: Diagnosis not present

## 2010-08-01 DIAGNOSIS — K7460 Unspecified cirrhosis of liver: Secondary | ICD-10-CM

## 2010-08-01 LAB — BASIC METABOLIC PANEL
CO2: 30 mEq/L (ref 19–32)
Chloride: 81 mEq/L — ABNORMAL LOW (ref 96–112)
Creatinine, Ser: 2.43 mg/dL — ABNORMAL HIGH (ref 0.4–1.5)
Potassium: 3.9 mEq/L (ref 3.5–5.1)

## 2010-08-01 LAB — DIFFERENTIAL
Eosinophils Absolute: 0.1 10*3/uL (ref 0.0–0.7)
Lymphocytes Relative: 12 % (ref 12–46)
Lymphs Abs: 1.8 10*3/uL (ref 0.7–4.0)
Neutrophils Relative %: 80 % — ABNORMAL HIGH (ref 43–77)

## 2010-08-01 LAB — APTT: aPTT: 35 seconds (ref 24–37)

## 2010-08-01 LAB — CBC
MCV: 85.7 fL (ref 78.0–100.0)
Platelets: 277 10*3/uL (ref 150–400)
RBC: 3.07 MIL/uL — ABNORMAL LOW (ref 4.22–5.81)
WBC: 14.8 10*3/uL — ABNORMAL HIGH (ref 4.0–10.5)

## 2010-08-01 LAB — PREALBUMIN: Prealbumin: 19.6 mg/dL (ref 17.0–34.0)

## 2010-08-01 LAB — PROTIME-INR
INR: 1.28 (ref 0.00–1.49)
Prothrombin Time: 16.2 seconds — ABNORMAL HIGH (ref 11.6–15.2)

## 2010-08-01 LAB — CLOSTRIDIUM DIFFICILE BY PCR: Toxigenic C. Difficile by PCR: NEGATIVE

## 2010-08-01 LAB — FERRITIN: Ferritin: 1311 ng/mL — ABNORMAL HIGH (ref 22–322)

## 2010-08-01 LAB — IRON AND TIBC: UIBC: 197 ug/dL

## 2010-08-01 LAB — ERYTHROPOIETIN: Erythropoietin: 22.6 m[IU]/mL (ref 2.6–34.0)

## 2010-08-01 LAB — CEA: CEA: 2.8 ng/mL (ref 0.0–5.0)

## 2010-08-01 NOTE — Assessment & Plan Note (Signed)
See "personal hx colon cancer"

## 2010-08-01 NOTE — Progress Notes (Signed)
Cc to PCP 

## 2010-08-01 NOTE — Assessment & Plan Note (Signed)
Hx of cirrhosis, incidental finding on CT in December. Likely due to ETOH. Last AFP was in December, slightly elevated at 9.5.Was due for repeat in 3 mos, yet unable to do secondary to health.  Will need updated AFP for HCC screening.

## 2010-08-01 NOTE — Assessment & Plan Note (Addendum)
60 year old cachectic-appearing Caucasian male who presents with hx of colon cancer, s/p multiple abdominal surgeries in past few months. See PMH. Now with large amount of watery diarrhea from ileostomy, stool cultures and Cdiff negative in early June. Afebrile, but feeling weak and with abnormal labs. Tachycardic and hypotensive. Due to personal hx, will need to admit and obtain CT abd/pelvis with po contrast only (cr 2.23) to assess for any intraabdominal process going on as well as obtain updated stool studies. +leukocytosis. Continued wt loss, likely dehydrated due to increased ileostomy output. Please see outlined labs in HPI.  This was discussed with Dr. Jena Gauss. Will admit under Dr. Renard Matter as Attending. In the interim, will start the following orders:  NPO CT abd/pelvis with po contrast only STAT CBC, BMP stat Obtain stool culture, Cdiff PCR, O&P, fecal lactoferrin Vitals q 4 hours Record I/0 Further orders per Dr. Renard Matter  Will be glad to follow in consult if needed.

## 2010-08-01 NOTE — Progress Notes (Signed)
Referring Provider: [name], MD Primary Care Physician:  [name], MD Primary Gastroenterologist: Dr. Jena Gauss  Chief Complaint  Patient presents with  . Diarrhea    watery    HPI:   Peter [name] is a 60 year old Caucasian male who presents today in consult from Dr. Renard Matter secondary to continued loose stools. He has a complicated hx, starting in Dec 2011 when a cecal mass was found by Dr. Jena Gauss on screening colonoscopy. He underwent a right hemicolectomy on Feb 24, 2010. On Jan 13, he had an exploratory laparotomy with oversew of anastomotic leak. He developed an enterocutaneous fistula. During this prolonged hospitalization, he went into ARF. On Feb 28, he had his third abdominal surgery: exploratory laparotomy with partial small bowel resection and placement of ileostomy secondary to hx of EC fistula and anastomotic leak. He has continued to lose weight. When he was originally seen by our office in Dec 2011, he weighed 176. He is now quite cachetic-appearing, with a wt of 121. He saw Dr. Mariel Sleet yesterday. Incidentally, findings suggestive of cirrhosis were noted on [place] of abdomen in Dec. Viral markers negative. Thought to be r/t hx of ETOH abuse.  He presents today with a 1 week hx of continuous loose stools. Reports emptying his ileostomy bag 10-12 times per day. Stool culture, Cdiff negative June 4th. Moderate yeast. He states he eats constantly. Reports decreased urine output, only urinating a "few times a day". Feels very weak. Reports insides are "burning". Was feeling great last week, but Sunday "ran out of gas".   He is tachycardic in office today, resting pulse 120-130s. Hypotensive with original BP 81/51. Orthostatics done: Laying down: BP 83/59, P106 Sitting: 79/59, 128 Standing 80/57, 134  6/11 labs: leukocytosis with WBC 15.5, Na 126, Cr 2.23, BUN 23   Past Medical History  Diagnosis Date  . Gout   . GERD (gastroesophageal reflux disease)   . Cirrhosis    ?ETOH related  . Colon cancer Dec 2011    [place]ecum  . ARF (acute renal failure)     hospitalization Jan 2012  . H/O ETOH abuse   . 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 S/P endoscopy Dec 2011    Schatzki's ring, Grade 1 esophageal varices, antral/body erosions     Past Surgical History  Procedure Date  . Colon surgery 02/24/2010    colon cancer (cecum)  . Exploratory laparotomy 03/03/2010    anastomotic leak, developed EC fistula  . Exploratory laparotomy w/ bowel resection 04/18/2010    ileostomy placed, (hx of EC fistula, anastomotic leak)     Please see attached medications.    Allergies as of 08/01/2010 - Review Complete 08/01/2010  Allergen Reaction Noted  . Ativan  08/01/2010  . Percocet (oxycodone-acetaminophen)  08/01/2010    Family History  Problem Relation Age of Onset  . Cirrhosis Father     deceased, secondary to ETOH    History   Social History  . Marital Status: Married    Spouse Name: N/A    Number of Children: N/A  . Years of Education: N/A   Social History Main Topics  . Smoking status: Never Smoker   . Smokeless tobacco: None  . Alcohol Use: No     in the past  . Drug Use: No  . Sexually Active: None   Other Topics Concern  . None   Social History Narrative  . None    Review of Systems:  Gen: Denies fever, chills, anorexia. Complains of fatigue and wt loss.  CV: Denies chest pain, palpitations, syncope, peripheral edema, and claudication. Resp: Denies dyspnea at rest, cough, wheezing, coughing up blood, and pleurisy. GI: Denies vomiting blood, jaundice, and fecal incontinence.   Denies dysphagia or odynophagia. Derm: Denies rash, itching, dry skin Psych: Denies depression, anxiety, memory loss, confusion. No homicidal or suicidal ideation.  Heme: Denies bruising, bleeding, and enlarged lymph nodes.  Physical Exam: BP 81/51  Pulse 132  Temp(Src) 97.6 F (36.4 C) (Temporal)  Ht 5\' 10"   (1.778 m)  Wt 121 lb 6.4 oz (55.067 kg)  BMI 17.42 kg/m2 General:   Alert and oriented. No distress noted. Cachectic appearing.  Head:  Normocephalic and atraumatic. Eyes:  Conjuctiva clear without scleral icterus. Mouth:  Oral mucosa pink and moist. Good dentition. No lesions. Neck:  Supple, without mass or thyromegaly. Heart:  S1, S2 present without murmurs, rubs, or gallops. Tachycardic.  Abdomen:  +BS, thin, moderately TTP diffusely. No rebound or guarding. NO HSM noted. No masses noted. Ileostomy in place.  Msk:  Symmetrical without gross deformities. Normal posture. Extremities:  Without edema. Neurologic:  Alert and  oriented x4;  grossly normal neurologically. Skin:  Intact without significant lesions or rashes. Psych:  Alert and cooperative. Normal mood and affect.

## 2010-08-02 ENCOUNTER — Encounter (HOSPITAL_COMMUNITY): Payer: Self-pay | Admitting: *Deleted

## 2010-08-02 LAB — OVA AND PARASITE EXAMINATION: Ova and parasites: NONE SEEN

## 2010-08-02 LAB — BASIC METABOLIC PANEL
BUN: 22 mg/dL (ref 6–23)
Calcium: 6.8 mg/dL — ABNORMAL LOW (ref 8.4–10.5)
Creatinine, Ser: 2.27 mg/dL — ABNORMAL HIGH (ref 0.4–1.5)
GFR calc Af Amer: 36 mL/min — ABNORMAL LOW (ref >60)

## 2010-08-02 LAB — FECAL LACTOFERRIN, QUANT: Fecal Lactoferrin: NEGATIVE

## 2010-08-02 LAB — CBC
Platelets: 198 10*3/uL (ref 150–400)
RBC: 2.65 MIL/uL — ABNORMAL LOW (ref 4.22–5.81)
RDW: 15.6 % — ABNORMAL HIGH (ref 11.5–15.5)
WBC: 8.4 10*3/uL (ref 4.0–10.5)

## 2010-08-02 LAB — DIFFERENTIAL
Basophils Absolute: 0 10*3/uL (ref 0.0–0.1)
Basophils Relative: 0 % (ref 0–1)
Eosinophils Absolute: 0.1 10*3/uL (ref 0.0–0.7)
Lymphs Abs: 1.2 10*3/uL (ref 0.7–4.0)
Neutrophils Relative %: 78 % — ABNORMAL HIGH (ref 43–77)

## 2010-08-03 ENCOUNTER — Ambulatory Visit (HOSPITAL_COMMUNITY): Payer: Medicaid Other

## 2010-08-03 ENCOUNTER — Other Ambulatory Visit (HOSPITAL_COMMUNITY): Payer: Medicaid Other

## 2010-08-03 DIAGNOSIS — K651 Peritoneal abscess: Secondary | ICD-10-CM | POA: Insufficient documentation

## 2010-08-03 DIAGNOSIS — Z9049 Acquired absence of other specified parts of digestive tract: Secondary | ICD-10-CM | POA: Insufficient documentation

## 2010-08-05 LAB — BASIC METABOLIC PANEL
BUN: 15 mg/dL (ref 6–23)
CO2: 27 mEq/L (ref 19–32)
CO2: 26 mEq/L (ref 19–32)
Calcium: 6.6 mg/dL — ABNORMAL LOW (ref 8.4–10.5)
Chloride: 93 mEq/L — ABNORMAL LOW (ref 96–112)
Chloride: 94 mEq/L — ABNORMAL LOW (ref 96–112)
GFR calc non Af Amer: 31 mL/min — ABNORMAL LOW (ref >60)
Glucose, Bld: 79 mg/dL (ref 70–99)
Glucose, Bld: 125 mg/dL — ABNORMAL HIGH (ref 70–99)
Potassium: 2.8 mEq/L — ABNORMAL LOW (ref 3.5–5.1)
Potassium: 3.4 mEq/L — ABNORMAL LOW (ref 3.5–5.1)
Sodium: 130 mEq/L — ABNORMAL LOW (ref 135–145)
Sodium: 129 mEq/L — ABNORMAL LOW (ref 135–145)

## 2010-08-05 LAB — CROSSMATCH
ABO/RH(D): B POS
Unit division: 0

## 2010-08-05 LAB — CBC
Hemoglobin: 10.6 g/dL — ABNORMAL LOW (ref 13.0–17.0)
Platelets: 177 10*3/uL (ref 150–400)
RBC: 3.47 MIL/uL — ABNORMAL LOW (ref 4.22–5.81)
WBC: 8.2 10*3/uL (ref 4.0–10.5)

## 2010-08-05 LAB — STOOL CULTURE

## 2010-08-05 LAB — DIFFERENTIAL
Basophils Absolute: 0 10*3/uL (ref 0.0–0.1)
Basophils Relative: 1 % (ref 0–1)
Eosinophils Absolute: 0.2 10*3/uL (ref 0.0–0.7)
Monocytes Relative: 7 % (ref 3–12)
Neutro Abs: 6.4 10*3/uL (ref 1.7–7.7)
Neutrophils Relative %: 78 % — ABNORMAL HIGH (ref 43–77)

## 2010-08-06 LAB — BASIC METABOLIC PANEL
BUN: 14 mg/dL (ref 6–23)
CO2: 28 mEq/L (ref 19–32)
Chloride: 94 mEq/L — ABNORMAL LOW (ref 96–112)
GFR calc Af Amer: 43 mL/min — ABNORMAL LOW (ref >60)
GFR calc Af Amer: 45 mL/min — ABNORMAL LOW (ref >60)
GFR calc non Af Amer: 37 mL/min — ABNORMAL LOW (ref >60)
Potassium: 3.1 mEq/L — ABNORMAL LOW (ref 3.5–5.1)
Potassium: 4.2 mEq/L (ref 3.5–5.1)
Sodium: 130 mEq/L — ABNORMAL LOW (ref 135–145)

## 2010-08-06 LAB — DIFFERENTIAL
Basophils Absolute: 0 10*3/uL (ref 0.0–0.1)
Basophils Relative: 0 % (ref 0–1)
Eosinophils Relative: 3 % (ref 0–5)
Monocytes Absolute: 0.4 10*3/uL (ref 0.1–1.0)
Neutro Abs: 4.3 10*3/uL (ref 1.7–7.7)

## 2010-08-06 LAB — CBC
Hemoglobin: 10.9 g/dL — ABNORMAL LOW (ref 13.0–17.0)
MCHC: 34.5 g/dL (ref 30.0–36.0)
Platelets: 172 10*3/uL (ref 150–400)
RDW: 15.6 % — ABNORMAL HIGH (ref 11.5–15.5)

## 2010-08-06 LAB — CULTURE, ROUTINE-ABSCESS

## 2010-08-07 ENCOUNTER — Inpatient Hospital Stay (HOSPITAL_COMMUNITY): Payer: Medicaid Other

## 2010-08-07 DIAGNOSIS — D638 Anemia in other chronic diseases classified elsewhere: Secondary | ICD-10-CM

## 2010-08-07 DIAGNOSIS — C189 Malignant neoplasm of colon, unspecified: Secondary | ICD-10-CM

## 2010-08-07 DIAGNOSIS — N189 Chronic kidney disease, unspecified: Secondary | ICD-10-CM

## 2010-08-07 LAB — DIFFERENTIAL
Basophils Absolute: 0.1 10*3/uL (ref 0.0–0.1)
Basophils Relative: 1 % (ref 0–1)
Eosinophils Relative: 3 % (ref 0–5)
Monocytes Absolute: 0.4 10*3/uL (ref 0.1–1.0)
Monocytes Relative: 7 % (ref 3–12)
Neutro Abs: 3.7 10*3/uL (ref 1.7–7.7)

## 2010-08-07 LAB — BASIC METABOLIC PANEL
Chloride: 97 mEq/L (ref 96–112)
GFR calc non Af Amer: 41 mL/min — ABNORMAL LOW (ref >60)
Potassium: 3.6 mEq/L (ref 3.5–5.1)

## 2010-08-07 LAB — CBC
Hemoglobin: 10.6 g/dL — ABNORMAL LOW (ref 13.0–17.0)
MCH: 30 pg (ref 26.0–34.0)
MCHC: 33.4 g/dL (ref 30.0–36.0)
RDW: 15.8 % — ABNORMAL HIGH (ref 11.5–15.5)

## 2010-08-09 NOTE — Group Therapy Note (Signed)
  NAMEAUGUSTA, Peter [name] NO.:  [id number]  MEDICAL RECORD NO.:  [id number]  LOCATION:  A312                          FACILITY:  [facility]  PHYSICIAN:  Mackenna Kamer G. Renard Matter, MD   DATE OF BIRTH:  [birth year]  DATE OF PROCEDURE:  08/04/2010 DATE OF DISCHARGE:                                PROGRESS NOTE   SUBJECTIVE:  This patient has abscesses along the left psoas muscle and similar finding on the right psoas muscle.  He was transferred yesterday to [facility] for interventional radiological drainage of abscesses which was accomplished.  It was noted that the patient had an extremely low hemoglobin 7.9 with hematocrit of 22.7, and he was transfused 2 units of packed RBCs yesterday and was more comfortable this morning.  OBJECTIVE:  VITAL SIGNS:  Blood pressure 95/61, respirations 18, pulse 92, and temperature 98.5. LUNGS:  Diminished breath sounds. HEART:  Regular rhythm. ABDOMEN:  The patient does have hyperactive bowel sounds, slightly tender over lower abdomen.  The patient does have colostomy, right lower quadrant.  ASSESSMENT:  The patient is being treated for above-stated problems.  PLAN:  We will continue to monitor the hemoglobin/hematocrit, continue to monitor chemistries, and continue IV fluids with IV Zosyn.     Asim Gersten G. Renard Matter, MD     AGM/MEDQ  D:  08/04/2010  T:  08/04/2010  Job:  [id number]  Electronically Signed by Butch Penny MD on 08/09/2010 07:06:16 AM

## 2010-08-09 NOTE — Group Therapy Note (Signed)
  NAMESLAYDE, BRAULT NO.:  [id number]  MEDICAL RECORD NO.:  [id number]  LOCATION:  A312                          FACILITY:  [facility]  PHYSICIAN:  Kenae Lindquist G. Renard Matter, MD   DATE OF BIRTH:  02-08-1951  DATE OF PROCEDURE:  08/03/2010 DATE OF DISCHARGE:                                PROGRESS NOTE   SUBJECTIVE:  This patient is scheduled today for transfer to [facility] for drainage of abscess by Interventional Radiology.  He is much more alert today.  He does have CT evidence of abscess along the left psoas muscle and also similar finding on the right psoas muscle.  OBJECTIVE:  VITAL SIGNS:  Blood pressure 92/58, respirations 20, pulse 108, and temperature 97.7. LUNGS:  Clear to P and A. HEART:  Regular rhythm. ABDOMEN:  Slightly tender.  The patient has a colostomy bag in the right lower quadrant.  ASSESSMENT:  The patient was admitted with above-stated problems and is scheduled for drainage of abscesses today by Interventional Radiology in [place].  PLAN:  Continue to monitor blood pressures, continue intravenous fluids, and intravenous antibiotics.     Daphane Odekirk G. Renard Matter, MD     AGM/MEDQ  D:  08/03/2010  T:  08/03/2010  Job:  [id number]  Electronically Signed by Butch Penny MD on 08/09/2010 07:06:13 AM

## 2010-08-09 NOTE — H&P (Signed)
Peter [name], Peter [name]              ACCOUNT NO.:  [id number]  MEDICAL RECORD NO.:  [id number]  LOCATION:                                 FACILITY:  PHYSICIAN:  Trinnity Breunig G. Renard Matter, MD   DATE OF BIRTH:  11-21-1950  DATE OF ADMISSION: DATE OF DISCHARGE:  LH                             HISTORY & PHYSICAL   HISTORY OF PRESENT ILLNESS:  A 60 year old Caucasian male who first presented to Dr. Luvenia Starch office with chief complaint being weight loss and continuous loose stools.  Apparently, in January 20, 2010 he had a cecal mass found on colonoscopy and subsequently had a right hemicolectomy on February 24, 2010.  On March 03, 2010, he had an exploratory laparotomy with oversewing of anastomotic leak.  Subsequent to this, he developed enterocutaneous fistula.  During his stay in intensive care, he developed ARF.  On April 18, 2010, he had third abdominal surgery which is exploratory laparotomy with partial small bowel resection and replacement of the ileostomy.  He states that he has lost weight recently and has had frequent loose stools.  A CT of the abdomen and pelvis showed marked enlargement of fluid collection on the left psoas muscle compatible with abscess second fluid collection along the right psoas muscle which is also consistent with abscess.  This finding was discussed with me.  The patient was found to have low blood pressure.  On admission he was given, intravenous fluids also had a low serum sodium and was given saline.  These findings were discussed with Dr. Lovell Sheehan who stated he would see the patient subsequently.  SOCIAL HISTORY:  The patient lives at home.  He was a former heavy drinker of alcohol but has not been drinking for considerable period of time now nor does he smoke.  PAST SURGICAL HISTORY:  Right hemicolectomy on February 24, 2010, exploratory laparotomy with oversewing of anastomotic leak, on March 03, 2010, enterocutaneous fistula subsequent to this he  went into ARF, on April 18, 2010, had third abdominal surgery, exploratory laparotomy with partial small bowel resection and placement of ileostomy secondary to his EC fistula anastomotic leak.  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  ALLERGIES:  The patient does not have known allergies.  REVIEW OF SYSTEMS:  HEENT:  Negative.  CARDIOPULMONARY:  No cough or dyspnea or hemoptysis.  GI:  No vomiting, minimal abdominal pain but frequent loose stools in colostomy bag.  GU:  No dysuria, hematuria.  PHYSICAL EXAMINATION:  GENERAL:  Thin appearing but alert male with blood pressure 85/57, respirations 18, pulse 114, and temperature 98. HEENT:  Eyes: PERRLA.  TMs negative.  Oropharynx benign. NECK:  Supple.  No JVD or thyroid abnormalities. LUNGS:  Clear to P and A. HEART:  Regular rhythm.  No murmurs. ABDOMEN:  The patient has ileostomy in place.  He has no tenderness.  No organomegaly. EXTREMITIES:  Free of edema. NEUROLOGIC:  No focal deficit. SKIN:  Warm and dry.  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  He does have now abscesses of soleus  muscles bilateral.  He is anemic with a hemoglobin 9.3, hematocrit of 26.3.  He has hyponatremia, azotemia with BUN 26 and creatinine 2.43.  Dictation ended at this point.     Kately Graffam G. Renard Matter, MD     AGM/MEDQ  D:  08/01/2010  T:  08/02/2010  Job:  [id number]  Electronically Signed by Butch Penny MD on 08/09/2010 07:06:04 AM

## 2010-08-09 NOTE — Group Therapy Note (Signed)
  Peter [name], Peter [name]              ACCOUNT NO.:  [id number]  MEDICAL RECORD NO.:  [id number]  LOCATION:                                 FACILITY:  PHYSICIAN:  Pollyann Roa G. Renard Matter, MD   DATE OF BIRTH:  [birth year]  DATE OF PROCEDURE: DATE OF DISCHARGE:                                PROGRESS NOTE   This patient has abscesses along the left and right psoas muscles.  He did have interventional radiological drainage of abscesses which he tolerated well, remains on IV antibiotics.  Culture of abscesses showed positive cocci in pairs.  His hemoglobin now is 10.6, hematocrit 31.7. BMET essentially normal with the exception of slightly low serum sodium, serum calcium is 6.4.  PLAN:  Continue current regimen.     Terral Cooks G. Renard Matter, MD     AGM/MEDQ  D:  08/07/2010  T:  08/07/2010  Job:  [id number]  Electronically Signed by Butch Penny MD on 08/09/2010 07:06:19 AM

## 2010-08-09 NOTE — Group Therapy Note (Signed)
  [name], Peter [name]              ACCOUNT NO.:  [id number]  MEDICAL RECORD NO.:  [id number]  LOCATION:                                 FACILITY:  PHYSICIAN:  Erendida Wrenn G. Renard Matter, MD   DATE OF BIRTH:  03/26/1950  DATE OF PROCEDURE: DATE OF DISCHARGE:                                PROGRESS NOTE   SUBJECTIVE:  This patient has abscesses along the left psoas muscle and similar finding on the right psoas muscle.  He did have interventional radiological drainage of abscesses which he apparently tolerated well. His hemoglobin is more normal 10.6, hematocrit 30.3, but he has a low serum potassium today of 2.8.  His creatinine is 2.08.  OBJECTIVE:  VITAL SIGNS:  Blood pressure 104/67, respirations 19, pulse 90, temp 97.9. LUNGS:  Clear to P and A. HEART:  Regular rhythm. ABDOMEN:  The patient does have colostomy bag in right lower quadrant. Abdomen is somewhat tender.  ASSESSMENT:  The patient had recent drainage of abscesses along psoas muscle.  He does have elevated creatinine.  He does have low serum potassium this will be repleted today.  Continue current regimen otherwise.     Sherell Christoffel G. Renard Matter, MD     AGM/MEDQ  D:  08/05/2010  T:  08/05/2010  Job:  [id number]  Electronically Signed by Butch Penny MD on 08/09/2010 07:06:22 AM

## 2010-08-09 NOTE — Group Therapy Note (Signed)
  [name], FOLKS NO.:  [id number]  MEDICAL RECORD NO.:  [id number]  LOCATION:  A312                          FACILITY:  [facility]  PHYSICIAN:  Peter [name] G. Renard Matter, MD   DATE OF BIRTH:  Nov 03, 1950  DATE OF PROCEDURE:  08/02/2010 DATE OF DISCHARGE:                                PROGRESS NOTE   This patient was admitted with weakness and failure to thrive and dehydration.  He had a CT scan done on admission which showed evidence of abscess along the left psoas muscle and also similar finding on the right psoas muscle.  Surgical consult was obtained with Dr. Lovell [name] who has arranged for the patient to be transferred to [facility] today for interventional radiological drainage of the abscess.  The patient also was somewhat hypotensive on admission and had evidence of low-serum sodium and elevated creatinine.  OBJECTIVE:  VITAL SIGNS:  Blood pressure 90/57, respirations 20, pulse 112, temperature 98.7. LUNGS:  Clear to P and A. HEART:  Regular rhythm. ABDOMEN:  The patient has a colostomy bag in right lower quadrant. Abdomen was somewhat tender. EXTREMITIES:  Free of edema.  ASSESSMENT:  The patient is still being treated for above-stated problems.  PLAN:  To proceed with transfer to [facility] for drainage of the abscess.  Continue intravenous fluids.     Peter Creason G. Renard Matter, MD     AGM/MEDQ  D:  08/02/2010  T:  08/02/2010  Job:  [id number]  Electronically Signed by Peter Penny MD on 08/09/2010 07:06:10 AM

## 2010-08-09 NOTE — Group Therapy Note (Signed)
  NAMEPRUDENCIO, VELAZCO              ACCOUNT NO.:  [id number]  MEDICAL RECORD NO.:  [id number]  LOCATION:                                 FACILITY:  PHYSICIAN:  Terrez Ander G. Baya Lentz, MD   DATE OF BIRTH:  1950/02/25  DATE OF PROCEDURE: DATE OF DISCHARGE:                                PROGRESS NOTE   This patient has abscesses along the left psoas muscle and similar finding on the right psoas muscle.  He did have interventional radiological drainage of abscesses which he tolerated well.  He remains on IV antibiotics.  His hemoglobin now is 10.9 and hematocrit 31.3.  Chemistries show a sodium of 130, potassium 3.1, creatinine 1.96.  PHYSICAL EXAMINATION:  LUNGS:  Clear to P and A. HEART:  Regular rhythm. ABDOMEN:  The patient does have colostomy, right lower quadrant.  ASSESSMENT:  The patient continues to be treated for above-stated problems.  He does have hyponatremia, hypokalemia.  PLAN:  Replete potassium today.  Continue current antibiotics.     Argelia Formisano G. Renard Matter, MD     AGM/MEDQ  D:  08/06/2010  T:  08/06/2010  Job:  [id number]  Electronically Signed by Butch Penny MD on 08/09/2010 07:06:25 AM

## 2010-08-09 NOTE — H&P (Signed)
  NAMEKHAMANI, FAIRLEY NO.:  [id number]  MEDICAL RECORD NO.:  [id number]  LOCATION:  A312                          FACILITY:  [facility]  PHYSICIAN:  Denine Brotz G. Renard Matter, MD   DATE OF BIRTH:  15-Jun-1950  DATE OF ADMISSION:  08/01/2010 DATE OF DISCHARGE:  LH                             HISTORY & PHYSICAL   ADDENDUM:  MEDICATION LIST: 1. Hydrocodone 5/325 every 4 hours p.r.n. for pain. 2. Loperamide 2 mg one capsule q.i.d. p.r.n. for diarrhea. 3. Megestrol 40 mg daily. 4. Protonix 40 mg b.i.d. 5. Xanax 0.25 mg at bedtime.     Lorie Melichar G. Renard Matter, MD     AGM/MEDQ  D:  08/01/2010  T:  08/02/2010  Job:  [id number]  Electronically Signed by Butch Penny MD on 08/09/2010 07:05:57 AM

## 2010-08-10 ENCOUNTER — Other Ambulatory Visit (HOSPITAL_COMMUNITY): Payer: Self-pay | Admitting: Oncology

## 2010-08-10 ENCOUNTER — Encounter (HOSPITAL_COMMUNITY): Payer: Self-pay | Admitting: Oncology

## 2010-08-10 DIAGNOSIS — C189 Malignant neoplasm of colon, unspecified: Secondary | ICD-10-CM | POA: Insufficient documentation

## 2010-08-10 DIAGNOSIS — D638 Anemia in other chronic diseases classified elsewhere: Secondary | ICD-10-CM | POA: Insufficient documentation

## 2010-08-14 ENCOUNTER — Encounter (HOSPITAL_COMMUNITY): Payer: Medicaid Other | Attending: Oncology

## 2010-08-14 DIAGNOSIS — K769 Liver disease, unspecified: Secondary | ICD-10-CM

## 2010-08-14 DIAGNOSIS — N289 Disorder of kidney and ureter, unspecified: Secondary | ICD-10-CM | POA: Insufficient documentation

## 2010-08-14 DIAGNOSIS — D649 Anemia, unspecified: Secondary | ICD-10-CM

## 2010-08-14 DIAGNOSIS — C189 Malignant neoplasm of colon, unspecified: Secondary | ICD-10-CM

## 2010-08-21 ENCOUNTER — Encounter (HOSPITAL_COMMUNITY): Payer: Medicaid Other | Attending: Oncology

## 2010-08-21 DIAGNOSIS — D638 Anemia in other chronic diseases classified elsewhere: Secondary | ICD-10-CM

## 2010-08-21 DIAGNOSIS — N289 Disorder of kidney and ureter, unspecified: Secondary | ICD-10-CM

## 2010-08-21 DIAGNOSIS — C189 Malignant neoplasm of colon, unspecified: Secondary | ICD-10-CM

## 2010-08-28 ENCOUNTER — Encounter (HOSPITAL_COMMUNITY): Payer: Medicaid Other | Attending: Oncology

## 2010-08-28 DIAGNOSIS — C189 Malignant neoplasm of colon, unspecified: Secondary | ICD-10-CM

## 2010-08-28 DIAGNOSIS — D638 Anemia in other chronic diseases classified elsewhere: Secondary | ICD-10-CM

## 2010-08-28 MED ORDER — EPOETIN ALFA 40000 UNIT/ML IJ SOLN
INTRAMUSCULAR | Status: AC
  Administered 2010-08-28: 40000 [IU] via SUBCUTANEOUS
  Filled 2010-08-28: qty 1

## 2010-09-04 ENCOUNTER — Encounter (HOSPITAL_BASED_OUTPATIENT_CLINIC_OR_DEPARTMENT_OTHER): Payer: Medicaid Other

## 2010-09-04 VITALS — BP 100/68 | HR 112

## 2010-09-04 DIAGNOSIS — C189 Malignant neoplasm of colon, unspecified: Secondary | ICD-10-CM

## 2010-09-04 DIAGNOSIS — D638 Anemia in other chronic diseases classified elsewhere: Secondary | ICD-10-CM

## 2010-09-04 LAB — CBC
MCH: 32.4 pg (ref 26.0–34.0)
MCV: 95.1 fL (ref 78.0–100.0)
Platelets: 161 10*3/uL (ref 150–400)
RDW: 19 % — ABNORMAL HIGH (ref 11.5–15.5)
WBC: 8 10*3/uL (ref 4.0–10.5)

## 2010-09-04 LAB — DIFFERENTIAL
Basophils Absolute: 0 10*3/uL (ref 0.0–0.1)
Eosinophils Absolute: 0.1 10*3/uL (ref 0.0–0.7)
Eosinophils Relative: 1 % (ref 0–5)
Lymphocytes Relative: 25 % (ref 12–46)
Neutrophils Relative %: 66 % (ref 43–77)

## 2010-09-04 LAB — RETICULOCYTES
RBC.: 4.07 MIL/uL — ABNORMAL LOW (ref 4.22–5.81)
Retic Ct Pct: 4.1 % — ABNORMAL HIGH (ref 0.4–3.1)

## 2010-09-04 MED ORDER — EPOETIN ALFA 40000 UNIT/ML IJ SOLN
INTRAMUSCULAR | Status: AC
  Administered 2010-09-04: 40000 [IU] via SUBCUTANEOUS
  Filled 2010-09-04: qty 1

## 2010-09-04 NOTE — Progress Notes (Signed)
Mardee Postin presents today for injection per MD orders. Procrit 40,000 units administered SQ in right Upper Arm. Administration without incident. Patient tolerated well.

## 2010-09-11 ENCOUNTER — Encounter (HOSPITAL_COMMUNITY): Payer: Medicaid Other

## 2010-09-11 MED ORDER — EPOETIN ALFA 40000 UNIT/ML IJ SOLN
INTRAMUSCULAR | Status: AC
  Filled 2010-09-11: qty 1

## 2010-09-18 ENCOUNTER — Encounter (HOSPITAL_COMMUNITY): Payer: Medicaid Other

## 2010-09-25 ENCOUNTER — Encounter (HOSPITAL_COMMUNITY): Payer: Medicaid Other

## 2010-09-25 ENCOUNTER — Encounter (HOSPITAL_COMMUNITY): Payer: Medicaid Other | Attending: Oncology

## 2010-09-25 DIAGNOSIS — D638 Anemia in other chronic diseases classified elsewhere: Secondary | ICD-10-CM

## 2010-09-25 LAB — CBC
MCH: 32.8 pg (ref 26.0–34.0)
MCV: 96.6 fL (ref 78.0–100.0)
Platelets: 143 10*3/uL — ABNORMAL LOW (ref 150–400)
RDW: 16 % — ABNORMAL HIGH (ref 11.5–15.5)

## 2010-09-25 NOTE — Progress Notes (Signed)
Labs drawn today for cbc 

## 2010-09-27 ENCOUNTER — Encounter (HOSPITAL_COMMUNITY): Payer: Self-pay | Admitting: *Deleted

## 2010-09-27 ENCOUNTER — Emergency Department (HOSPITAL_COMMUNITY): Payer: Medicaid Other

## 2010-09-27 ENCOUNTER — Inpatient Hospital Stay (HOSPITAL_COMMUNITY)
Admission: AD | Admit: 2010-09-27 | Discharge: 2010-10-05 | DRG: 373 | Disposition: A | Discharge status: Home / Self Care | Payer: Medicaid Other | Source: Other Acute Inpatient Hospital | Attending: General Surgery | Admitting: General Surgery

## 2010-09-27 DIAGNOSIS — N289 Disorder of kidney and ureter, unspecified: Secondary | ICD-10-CM

## 2010-09-27 DIAGNOSIS — E876 Hypokalemia: Secondary | ICD-10-CM

## 2010-09-27 DIAGNOSIS — R109 Unspecified abdominal pain: Secondary | ICD-10-CM

## 2010-09-27 DIAGNOSIS — C189 Malignant neoplasm of colon, unspecified: Secondary | ICD-10-CM

## 2010-09-27 DIAGNOSIS — Z792 Long term (current) use of antibiotics: Secondary | ICD-10-CM

## 2010-09-27 DIAGNOSIS — Z85038 Personal history of other malignant neoplasm of large intestine: Secondary | ICD-10-CM

## 2010-09-27 DIAGNOSIS — IMO0002 Reserved for concepts with insufficient information to code with codable children: Secondary | ICD-10-CM

## 2010-09-27 DIAGNOSIS — K6812 Psoas muscle abscess: Principal | ICD-10-CM | POA: Diagnosis present

## 2010-09-27 DIAGNOSIS — Z933 Colostomy status: Secondary | ICD-10-CM

## 2010-09-27 MED ORDER — ONDANSETRON HCL 4 MG/2ML IJ SOLN
4.0000 mg | Freq: Once | INTRAMUSCULAR | Status: AC
  Administered 2010-09-28: 4 mg via INTRAVENOUS
  Filled 2010-09-27: qty 2

## 2010-09-27 MED ORDER — SODIUM CHLORIDE 0.9 % IV SOLN
Freq: Once | INTRAVENOUS | Status: AC
  Administered 2010-09-27: via INTRAVENOUS

## 2010-09-27 MED ORDER — SODIUM CHLORIDE 0.9 % IV BOLUS (SEPSIS)
500.0000 mL | Freq: Once | INTRAVENOUS | Status: AC
  Administered 2010-09-28: 500 mL via INTRAVENOUS

## 2010-09-27 MED ORDER — MORPHINE SULFATE 2 MG/ML IJ SOLN
2.0000 mg | Freq: Once | INTRAMUSCULAR | Status: AC
  Administered 2010-09-28: 2 mg via INTRAVENOUS
  Filled 2010-09-27: qty 1

## 2010-09-27 NOTE — ED Notes (Signed)
Patient with colostomy since Feb., drain removed last week due to an abscess, c/o abd. Pain and nausea

## 2010-09-27 NOTE — ED Notes (Signed)
Pt reports abdominal pain around umbilical area, x2 days.  Denies any nausea or vomiting.  Pt has had recent abdominal abscess (drain tube removed about 1 month ago, per family.), colostomy in place.  Per family the stool in the colostomy is now a darker green than it had been.  Pt does report feeling weak and having decreased appetite.

## 2010-09-28 ENCOUNTER — Encounter (HOSPITAL_COMMUNITY): Payer: Self-pay | Admitting: *Deleted

## 2010-09-28 LAB — DIFFERENTIAL
Basophils Absolute: 0 10*3/uL (ref 0.0–0.1)
Basophils Relative: 0 % (ref 0–1)
Monocytes Absolute: 0.7 10*3/uL (ref 0.1–1.0)
Neutro Abs: 10.5 10*3/uL — ABNORMAL HIGH (ref 1.7–7.7)
Neutrophils Relative %: 81 % — ABNORMAL HIGH (ref 43–77)

## 2010-09-28 LAB — BASIC METABOLIC PANEL
CO2: 29 mEq/L (ref 19–32)
Chloride: 84 mEq/L — ABNORMAL LOW (ref 96–112)
Chloride: 87 mEq/L — ABNORMAL LOW (ref 96–112)
Creatinine, Ser: 2.75 mg/dL — ABNORMAL HIGH (ref 0.50–1.35)
GFR calc Af Amer: 29 mL/min — ABNORMAL LOW (ref >60)
Potassium: 2.9 mEq/L — ABNORMAL LOW (ref 3.5–5.1)
Potassium: 3.1 mEq/L — ABNORMAL LOW (ref 3.5–5.1)
Sodium: 130 mEq/L — ABNORMAL LOW (ref 135–145)
Sodium: 131 mEq/L — ABNORMAL LOW (ref 135–145)

## 2010-09-28 LAB — CBC
MCHC: 34.6 g/dL (ref 30.0–36.0)
Platelets: 110 10*3/uL — ABNORMAL LOW (ref 150–400)
Platelets: 129 10*3/uL — ABNORMAL LOW (ref 150–400)
RBC: 3.56 MIL/uL — ABNORMAL LOW (ref 4.22–5.81)
RDW: 15.4 % (ref 11.5–15.5)
WBC: 11.4 10*3/uL — ABNORMAL HIGH (ref 4.0–10.5)

## 2010-09-28 MED ORDER — SODIUM CHLORIDE 0.9 % IV SOLN
INTRAVENOUS | Status: AC
  Filled 2010-09-28: qty 1

## 2010-09-28 MED ORDER — BOOST / RESOURCE BREEZE PO LIQD
1.0000 | Freq: Three times a day (TID) | ORAL | Status: DC
  Administered 2010-09-29 – 2010-10-03 (×10): 1 via ORAL
  Filled 2010-09-28 (×17): qty 1

## 2010-09-28 MED ORDER — IOHEXOL 300 MG/ML  SOLN: 100.0000 mL | Freq: Once | INTRAMUSCULAR | Status: DC | PRN

## 2010-09-28 MED ORDER — LACTATED RINGERS IV SOLN
INTRAVENOUS | Status: DC
  Administered 2010-09-28: 1000 mL via INTRAVENOUS
  Administered 2010-09-28 – 2010-10-05 (×12): via INTRAVENOUS

## 2010-09-28 MED ORDER — ENOXAPARIN SODIUM 30 MG/0.3ML ~~LOC~~ SOLN
30.0000 mg | Freq: Every day | SUBCUTANEOUS | Status: DC
Start: 2010-09-29 — End: 2010-10-02
  Administered 2010-09-29 – 2010-10-01 (×3): 30 mg via SUBCUTANEOUS
  Filled 2010-09-28 (×3): qty 0.3

## 2010-09-28 MED ORDER — ONDANSETRON HCL 4 MG/2ML IJ SOLN
4.0000 mg | Freq: Four times a day (QID) | INTRAMUSCULAR | Status: DC | PRN
  Administered 2010-09-30: 4 mg via INTRAVENOUS
  Filled 2010-09-28: qty 2

## 2010-09-28 MED ORDER — ENOXAPARIN SODIUM 40 MG/0.4ML ~~LOC~~ SOLN
10.0000 mg | Freq: Every day | SUBCUTANEOUS | Status: DC
  Administered 2010-09-28: 10 mg via SUBCUTANEOUS
  Filled 2010-09-28: qty 0.4

## 2010-09-28 MED ORDER — MULTI-DELYN PO LIQD
5.0000 mL | Freq: Every day | ORAL | Status: DC
  Administered 2010-09-29 – 2010-10-05 (×6): 5 mL via ORAL
  Filled 2010-09-28 (×9): qty 5

## 2010-09-28 MED ORDER — POTASSIUM CHLORIDE CRYS ER 20 MEQ PO TBCR
60.0000 meq | EXTENDED_RELEASE_TABLET | Freq: Once | ORAL | Status: AC
  Administered 2010-09-28: 60 meq via ORAL
  Filled 2010-09-28: qty 3

## 2010-09-28 MED ORDER — HYDROMORPHONE HCL 1 MG/ML IJ SOLN
1.0000 mg | INTRAMUSCULAR | Status: DC | PRN
  Administered 2010-09-28 – 2010-10-01 (×11): 1 mg via INTRAVENOUS
  Filled 2010-09-28 (×12): qty 1

## 2010-09-28 MED ORDER — MORPHINE SULFATE 2 MG/ML IJ SOLN
2.0000 mg | Freq: Once | INTRAMUSCULAR | Status: AC
  Administered 2010-09-28: 2 mg via INTRAVENOUS
  Filled 2010-09-28: qty 1

## 2010-09-28 MED ORDER — SODIUM CHLORIDE 0.9 % IV SOLN
1.0000 g | INTRAVENOUS | Status: DC
  Administered 2010-09-29: 1 g via INTRAVENOUS
  Filled 2010-09-28 (×4): qty 1

## 2010-09-28 MED ORDER — PANTOPRAZOLE SODIUM 40 MG IV SOLR
40.0000 mg | Freq: Every day | INTRAVENOUS | Status: DC
  Administered 2010-09-28 (×2): 40 mg via INTRAVENOUS
  Filled 2010-09-28 (×2): qty 40

## 2010-09-28 MED ORDER — PRO-STAT SUGAR FREE PO LIQD
30.0000 mL | Freq: Three times a day (TID) | ORAL | Status: DC
  Administered 2010-09-28 – 2010-10-05 (×15): 30 mL via ORAL
  Filled 2010-09-28 (×14): qty 30

## 2010-09-28 MED ORDER — ALPRAZOLAM 0.5 MG PO TABS
0.5000 mg | ORAL_TABLET | Freq: Three times a day (TID) | ORAL | Status: DC | PRN
  Administered 2010-09-28 – 2010-10-02 (×2): 0.5 mg via ORAL
  Filled 2010-09-28 (×2): qty 1

## 2010-09-28 MED ORDER — SODIUM CHLORIDE 0.9 % IV SOLN
1.0000 g | INTRAVENOUS | Status: DC
  Administered 2010-09-28: 1 g via INTRAVENOUS
  Filled 2010-09-28: qty 1

## 2010-09-28 NOTE — H&P (Signed)
Peter [name] is an 60 y.o. male.   Chief Complaint: Abd pain. HPI: Pt is well known to my partner and me.  Multiple abd surgeries with poor healing.  Has been treated several months ago with IAA.  Had resolved with drainage procedure.  Started having pain two das ago.  Increased nausea.  Decreased appetite.  Low grade temperatures.  Pain is sharp and diffuse.  No change with ostomy outpt.  [place] but no active emesis.  No change with urination.  Past Medical History  Diagnosis Date  . Gout   . GERD (gastroesophageal reflux disease)   . Cirrhosis     ?ETOH related  . Colon cancer Dec 2011    Cecum  . ARF (acute renal failure)     hospitalization Jan 2012  . H/O ETOH abuse   . 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 S/P endoscopy Dec 2011    Schatzki's ring, Grade 1 esophageal varices, antral/body erosions  . Anemia of chronic disease 08/10/2010    Past Surgical History  Procedure Date  . Colon surgery 02/24/2010    colon cancer (cecum)  . Exploratory laparotomy 03/03/2010    anastomotic leak, developed EC fistula  . Exploratory laparotomy w/ bowel resection 04/18/2010    ileostomy placed, (hx of EC fistula, anastomotic leak)  . Colostomy   . Colon cancer     Family History  Problem Relation Age of Onset  . Cirrhosis Father     deceased, secondary to ETOH   Social History:  reports that he has never smoked. He does not have any smokeless tobacco history on file. He reports that he does not drink alcohol or use illicit drugs.  Allergies:  Allergies  Allergen Reactions  . Ativan Other (See Comments)    Causes Hallucinations, makes "crazy"  . Percocet (Oxycodone-Acetaminophen) Itching and Nausea Only    Medications Prior to Admission  Medication Dose Route Frequency Provider Last Rate Last Dose  . 0.9 %  sodium chloride infusion   Intravenous Once [employer]. Colon Branch, MD 75 mL/hr at 09/27/10 2330    . ALPRAZolam Prudy Feeler) tablet 0.5  mg  0.5 mg Oral TID PRN Gigi Gin Kass Herberger   0.5 mg at 09/28/10 1101  . enoxaparin (LOVENOX) injection 30 mg  30 mg Subcutaneous Daily Fabio Bering      . ertapenem (INVANZ) 1 g in sodium chloride 0.9 % 50 mL IVPB  1 g Intravenous Q24H Fabio Bering      . feeding supplement (PRO-STAT SUGAR FREE 64) liquid 30 mL  30 mL Oral TID WC Francene Boyers      . feeding supplement (RESOURCE BREEZE) liquid 1 Container  1 Container Oral TID WC Charlann Noss Weisner      . HYDROmorphone (DILAUDID) injection 1 mg  1 mg Intravenous Q4H PRN Fabio Bering   1 mg at 09/28/10 0929  . lactated ringers infusion   Intravenous Continuous Fabio Bering 115 mL/hr at 09/28/10 0650 1,000 mL at 09/28/10 0650  . morphine injection 2 mg  2 mg Intravenous Once [employer]. Colon Branch, MD   2 mg at 09/28/10 0021  . morphine injection 2 mg  2 mg Intravenous Once [employer]. Colon Branch, MD   2 mg at 09/28/10 0322  . multivitamin liquid 5 mL  5 mL Oral Daily Charlann Noss Weisner      . ondansetron (ZOFRAN) injection 4 mg  4 mg Intravenous  Once [employer]. Colon Branch, MD   4 mg at 09/28/10 0021  . ondansetron (ZOFRAN) injection 4 mg  4 mg Intravenous Q6H PRN Fabio Bering      . pantoprazole (PROTONIX) injection 40 mg  40 mg Intravenous Q0600 Fabio Bering   40 mg at 09/28/10 0651  . potassium chloride SA (K-DUR,KLOR-CON) CR tablet 60 mEq  60 mEq Oral Once [employer]. Colon Branch, MD   60 mEq at 09/28/10 0321  . sodium chloride 0.9 % bolus 500 mL  500 mL Intravenous Once [employer]. Colon Branch, MD   500 mL at 09/28/10 0021  . DISCONTD: enoxaparin (LOVENOX) injection 10 mg  10 mg Subcutaneous Daily Fabio Bering   10 mg at 09/28/10 0929  . DISCONTD: ertapenem (INVANZ) 1 g in sodium chloride 0.9 % 50 mL IVPB  1 g Intravenous Q24H Gigi Gin Demitria Hay   1 g at 09/28/10 0651  . DISCONTD: iohexol (OMNIPAQUE) 300 MG/ML injection 100 mL  100 mL Intravenous Once PRN Medication Radiologist       Medications Prior to Admission  Medication Sig Dispense Refill  .  ALPRAZolam (XANAX) 0.25 MG tablet Take 0.25 mg by mouth at bedtime as needed. For anxiety      . HYDROcodone-acetaminophen (NORCO) 5-325 MG per tablet Take 1 tablet by mouth every 6 (six) hours as needed. For pain      . MILK THISTLE PO Take 240 mg by mouth daily as needed.       . pantoprazole (PROTONIX) 40 MG tablet Take 40 mg by mouth 2 (two) times daily.       . promethazine (PHENERGAN) 25 MG tablet Take 25 mg by mouth every 6 (six) hours as needed. For nausea      . loperamide (IMODIUM) 2 MG capsule Take 2 mg by mouth 4 (four) times daily as needed.        . megestrol ([name]) 40 MG tablet Take 40 mg by mouth daily.          Results for orders placed during the hospital encounter of 09/27/10 (from the past 48 hour(s))  CBC     Status: Abnormal   Collection Time   09/28/10 12:00 AM      Component Value Range Comment   WBC 12.9 (*) 4.0 - 10.5 (K/uL)    RBC 4.04 (*) 4.22 - 5.81 (MIL/uL)    Hemoglobin 13.2  13.0 - 17.0 (g/dL)    HCT 16.1 (*) 09.6 - 52.0 (%)    MCV 94.3  78.0 - 100.0 (fL)    MCH 32.7  26.0 - 34.0 (pg)    MCHC 34.6  30.0 - 36.0 (g/dL)    RDW 04.5  40.9 - 81.1 (%)    Platelets 129 (*) 150 - 400 (K/uL)   DIFFERENTIAL     Status: Abnormal   Collection Time   09/28/10 12:00 AM      Component Value Range Comment   Neutrophils Relative 81 (*) 43 - 77 (%)    Neutro Abs 10.5 (*) 1.7 - 7.7 (K/uL)    Lymphocytes Relative 13  12 - 46 (%)    Lymphs Abs 1.7  0.7 - 4.0 (K/uL)    Monocytes Relative 5  3 - 12 (%)    Monocytes Absolute 0.7  0.1 - 1.0 (K/uL)    Eosinophils Relative 0  0 - 5 (%)    Eosinophils Absolute 0.1  0.0 - 0.7 (K/uL)    Basophils Relative 0  0 -  1 (%)    Basophils Absolute 0.0  0.0 - 0.1 (K/uL)   BASIC METABOLIC PANEL     Status: Abnormal   Collection Time   09/28/10 12:00 AM      Component Value Range Comment   Sodium 130 (*) 135 - 145 (mEq/L)    Potassium 2.9 (*) 3.5 - 5.1 (mEq/L)    Chloride 84 (*) 96 - 112 (mEq/L)    CO2 30  19 - 32 (mEq/L)    Glucose,  Bld 92  70 - 99 (mg/dL)    BUN 25 (*) 6 - 23 (mg/dL)    Creatinine, Ser 4.09 (*) 0.50 - 1.35 (mg/dL)    Calcium 9.1  8.4 - 10.5 (mg/dL)    GFR calc non Af Amer 24 (*) >60 (mL/min)    GFR calc Af Amer 29 (*) >60 (mL/min)   CULTURE, BLOOD (ROUTINE X 2)     Status: Normal (Preliminary result)   Collection Time   09/28/10 12:06 AM      Component Value Range Comment   Specimen Description BLOOD LEFT ARM      Special Requests BLOOD AEROBIC BOTTLE 5CC      Culture NO GROWTH <24 HRS      Report Status PENDING     CULTURE, BLOOD (ROUTINE X 2)     Status: Normal (Preliminary result)   Collection Time   09/28/10 12:07 AM      Component Value Range Comment   Specimen Description BLOOD RIGHT ARM      Special Requests BLOOD AEROBIC BOTTLE 6CC      Culture NO GROWTH <24 HRS      Report Status PENDING     CBC     Status: Abnormal   Collection Time   09/28/10  5:39 AM      Component Value Range Comment   WBC 11.4 (*) 4.0 - 10.5 (K/uL)    RBC 3.56 (*) 4.22 - 5.81 (MIL/uL)    Hemoglobin 11.8 (*) 13.0 - 17.0 (g/dL)    HCT 81.1 (*) 91.4 - 52.0 (%)    MCV 95.8  78.0 - 100.0 (fL)    MCH 33.1  26.0 - 34.0 (pg)    MCHC 34.6  30.0 - 36.0 (g/dL)    RDW 78.2 (*) 95.6 - 15.5 (%)    Platelets 110 (*) 150 - 400 (K/uL)   BASIC METABOLIC PANEL     Status: Abnormal   Collection Time   09/28/10  5:39 AM      Component Value Range Comment   Sodium 131 (*) 135 - 145 (mEq/L)    Potassium 3.1 (*) 3.5 - 5.1 (mEq/L)    Chloride 87 (*) 96 - 112 (mEq/L)    CO2 29  19 - 32 (mEq/L)    Glucose, Bld 92  70 - 99 (mg/dL)    BUN 23  6 - 23 (mg/dL)    Creatinine, Ser 2.13 (*) 0.50 - 1.35 (mg/dL)    Calcium 8.5  8.4 - 10.5 (mg/dL)    GFR calc non Af Amer 26 (*) >60 (mL/min)    GFR calc Af Amer 31 (*) >60 (mL/min)    Ct Abdomen Pelvis Wo Contrast  09/28/2010  *RADIOLOGY REPORT*  Clinical Data: Abdominal pain, status post right hemicolectomy with colostomy for colon cancer, history of abscess  CT ABDOMEN AND PELVIS WITHOUT  CONTRAST  Technique:  Multidetector CT imaging of the abdomen and pelvis was performed following the standard protocol without intravenous contrast.  Comparison:  08/01/2010  Findings: Lung bases are essentially clear.  Unenhanced liver, spleen the pancreas, and adrenal glands are within normal limits.  Gallbladder is unremarkable.  No intrahepatic or extrahepatic ductal dilatation.  Kidneys are unremarkable.  No renal calculi or hydronephrosis.  Status post right hemicolectomy with right lower quadrant colostomy. No evidence of bowel obstruction.  Moderate abdominopelvic ascites.  Recurrent 3.1 x 2.6 cm fluid collection just lateral to the left psoas (series 2/image 57). Residual 2.2 x 2.2 cm fluid collection lateral to the right psoas.  1.6 x 2.0 cm lesion in the anterior lower abdomen (series 2/image 63), measuring higher than simple fluid density, previously 0.9 x 1.1 cm.  Scattered small mesenteric and retroperitoneal lymph nodes which do not meet pathologic CT size criteria.  No evidence of abdominal aortic aneurysm.  Prostate is unremarkable.  Bladder is thick-walled but underdistended.  Mild degenerative changes of the visualized thoracolumbar spine. No focal osseous lesions.  IMPRESSION: Status post right hemicolectomy with right lower quadrant colostomy for colon cancer.  No evidence of bowel obstruction.  Recurrent 3.1 cm fluid collection lateral to the left psoas. Residual 2.2 cm fluid collection lateral to the right psoas. Moderate abdominopelvic ascites.  2.0 x 1.6 cm lesion in the midline lower abdomen, incompletely characterized in the absence of contrast, increased. Recurrent/metastatic disease is not excluded.  Original Report Authenticated By: Charline Bills, M.D.    Review of Systems  Constitutional: Positive for fever, chills and diaphoresis.  HENT: Negative.   Eyes: Negative.   Respiratory: Negative.   Cardiovascular: Negative.   Gastrointestinal: Positive for nausea, vomiting and  abdominal pain. Negative for diarrhea, constipation and blood in stool.  Genitourinary: Negative.   Musculoskeletal: Negative.   Skin: Negative.   Neurological: Positive for weakness.  Endo/Heme/Allergies: Negative.   Psychiatric/Behavioral: Negative.     Blood pressure 101/64, pulse 83, temperature 98.6 F (37 C), temperature source Oral, resp. rate 17, height 5\' 9"  (1.753 m), weight 58.06 kg (128 lb), SpO2 98.00%. Physical Exam  Constitutional: He is oriented to person, place, and time. He appears well-developed.       Cachectic/thin  HENT:  Head: Normocephalic and atraumatic.  Eyes: Conjunctivae and EOM are normal. Pupils are equal, round, and reactive to light.  Neck: Normal range of motion. Neck supple. No thyromegaly present.  Cardiovascular: Normal rate, regular rhythm, normal heart sounds and intact distal pulses.   Respiratory: Effort normal and breath sounds normal. No respiratory distress. He has no wheezes.  GI: Soft. Bowel sounds are normal. He exhibits no distension and no mass. There is tenderness (Lower quadrants bilaterally). There is no rebound and no guarding.  Musculoskeletal: Normal range of motion.  Lymphadenopathy:    He has no cervical adenopathy.  Neurological: He is alert and oriented to person, place, and time.  Skin: Skin is warm and dry.     Assessment/Plan Intra-abd abcess.  Small collections on CT.  COntinue abx.  Limit PO for bowel rest.  DVT prophylaxis.  Discussed plan with patient.  Surgical indications discussed with patient.  Quinteria Chisum C 09/28/2010, 10:10 PM

## 2010-09-28 NOTE — Consult Note (Signed)
Lovenox dosing. ClCr < 30 ml/min. Wt 58kg Lovenox renally adjusted to 30mg  sq q24hrs per literature recommendations.  Monitor cbc, plts.  Please advise if different approach desired.  Valrie Hart, PharmD

## 2010-09-28 NOTE — ED Notes (Signed)
Pt reports pain has decreased, and nausea has improved.  Pt resting at this time.

## 2010-09-28 NOTE — Progress Notes (Signed)
INITIAL ADULT NUTRITION ASSESSMENT Date: 09/28/2010   Time: 4:58 PM Reason for Assessment:H/O wt loss, poor diet tol   ASSESSMENT: Male 60 y.o.  Dx: <principal problem not specified>   Past Medical History  Diagnosis Date  . Gout   . GERD (gastroesophageal reflux disease)   . Cirrhosis     ?ETOH related  . Colon cancer Dec 2011    Cecum  . ARF (acute renal failure)     hospitalization Jan 2012  . H/O ETOH abuse   . 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 S/P endoscopy Dec 2011    Schatzki's ring, Grade 1 esophageal varices, antral/body erosions  . Anemia of chronic disease 08/10/2010    Scheduled Meds:   . sodium chloride   Intravenous Once  . enoxaparin  30 mg Subcutaneous Daily  . ertapenem  1 g Intravenous Q24H  . morphine  2 mg Intravenous Once  . morphine  2 mg Intravenous Once  . ondansetron  4 mg Intravenous Once  . pantoprazole  40 mg Intravenous Q0600  . potassium chloride SA  60 mEq Oral Once  . sodium chloride  500 mL Intravenous Once  . DISCONTD: enoxaparin  10 mg Subcutaneous Daily  . DISCONTD: ertapenem  1 g Intravenous Q24H   Continuous Infusions:   . lactated ringers 1,000 mL (09/28/10 0650)   PRN Meds:.ALPRAZolam, HYDROmorphone, ondansetron, DISCONTD: iohexol  Ht: 5\' 9"  (175.3 cm)  Wt: 128 lb (58.06 kg)  Ideal Wt: 66.2 kg 70.7 kg % Ideal Wt:82  Usual Wt: 125-130# % Usual Wt: 100%  Body mass index is 18.90 kg/(m^2).  Food/Nutrition Related Hx: Pt known to staff from previous admissions. Spouse reports appetite is excellent however, "everything goes into colostomy bag". Pt averages 10-15 BM per day. She reports frequently liquid consistency except with rice (says that the rice pushes the colostomy bag off every time). Pt doesn't like Ensure but is willing to try Breeze and ProStat 64 while on clears and Magic Cup once diet is advanced. Pt is high risk for malnutrition. Spouse is eager to receive  nutrition education r/t foods to help with preventing frequent BM's an wt loss.  Labs:  Results for Peter [name], Peter [name] (MRN [id number]) as of 09/28/2010 16:55  Ref. Range 09/28/2010 00:00 09/28/2010 00:06 09/28/2010 00:07 09/28/2010 01:35 09/28/2010 05:39  Sodium Latest Range: 135-145 mEq/L 130 (L)    131 (L)  Potassium Latest Range: 3.5-5.1 mEq/L 2.9 (L)    3.1 (L)  Chloride Latest Range: 96-112 mEq/L 84 (L)    87 (L)  CO2 Latest Range: 19-32 mEq/L 30    29  BUN Latest Range: 6-23 mg/dL 25 (H)    23  Creat Latest Range: 0.50-1.35 mg/dL 1.19 (H)    1.47 (H)  Calcium Latest Range: 8.4-10.5 mg/dL 9.1    8.5  GFR calc non Af Amer Latest Range: >60 mL/min 24 (L)    26 (L)  GFR calc Af Amer Latest Range: >60 mL/min 29 (L)    31 (L)  Glucose Latest Range: 70-99 mg/dL 92    92  WBC Latest Range: 4.0-10.5 K/uL 12.9 (H)    11.4 (H)  RBC Latest Range: 4.22-5.81 MIL/uL 4.04 (L)    3.56 (L)  HGB Latest Range: 13.0-17.0 g/dL 82.9    56.2 (L)  HCT Latest Range: 39.0-52.0 % 38.1 (L)    34.1 (L)  MCV Latest Range: 78.0-100.0 fL 94.3    95.8  MCH Latest Range: 26.0-34.0 pg 32.7    33.1  MCHC Latest Range: 30.0-36.0 g/dL 04.5    40.9  RDW Latest Range: 11.5-15.5 % 15.4    15.6 (H)  Platelets Latest Range: 150-400 K/uL 129 (L)    110 (L)  Neutrophils Relative Latest Range: 43-77 % 81 (H)      Lymphocytes Relative Latest Range: 12-46 % 13      Monocytes Relative Latest Range: 3-12 % 5      Eosinophils Relative Latest Range: 0-5 % 0      Basophils Relative Latest Range: 0-1 % 0      Neutrophils Absolute Latest Range: 1.7-7.7 K/uL 10.5 (H)      Lymphocytes Absolute Latest Range: 0.7-4.0 K/uL 1.7      Monocytes Absolute Latest Range: 0.1-1.0 K/uL 0.7      Eosinophils Absolute Latest Range: 0.0-0.7 K/uL 0.1      Basophils Absolute Latest Range: 0.0-0.1 K/uL 0.0      CULTURE, BLOOD (ROUTINE X 2) No range found  Rpt Rpt    CT ABDOMEN PELVIS WO CONTRAST No range found    Rpt    Results for Peter [name], Peter [name] (MRN  [id number]) as of 09/28/2010 16:55  Ref. Range 09/28/2010 00:00 09/28/2010 00:06 09/28/2010 00:07 09/28/2010 01:35 09/28/2010 05:39  Sodium Latest Range: 135-145 mEq/L 130 (L)    131 (L)  Potassium Latest Range: 3.5-5.1 mEq/L 2.9 (L)    3.1 (L)  Chloride Latest Range: 96-112 mEq/L 84 (L)    87 (L)  CO2 Latest Range: 19-32 mEq/L 30    29  BUN Latest Range: 6-23 mg/dL 25 (H)    23  Creat Latest Range: 0.50-1.35 mg/dL 9.56 (H)    2.13 (H)  Calcium Latest Range: 8.4-10.5 mg/dL 9.1    8.5  GFR calc non Af Amer Latest Range: >60 mL/min 24 (L)    26 (L)  GFR calc Af Amer Latest Range: >60 mL/min 29 (L)    31 (L)  Glucose Latest Range: 70-99 mg/dL 92    92  WBC Latest Range: 4.0-10.5 K/uL 12.9 (H)    11.4 (H)  RBC Latest Range: 4.22-5.81 MIL/uL 4.04 (L)    3.56 (L)  HGB Latest Range: 13.0-17.0 g/dL 08.6    57.8 (L)  HCT Latest Range: 39.0-52.0 % 38.1 (L)    34.1 (L)  MCV Latest Range: 78.0-100.0 fL 94.3    95.8  MCH Latest Range: 26.0-34.0 pg 32.7    33.1  MCHC Latest Range: 30.0-36.0 g/dL 46.9    62.9  RDW Latest Range: 11.5-15.5 % 15.4    15.6 (H)  Platelets Latest Range: 150-400 K/uL 129 (L)    110 (L)  Neutrophils Relative Latest Range: 43-77 % 81 (H)      Lymphocytes Relative Latest Range: 12-46 % 13      Monocytes Relative Latest Range: 3-12 % 5      Eosinophils Relative Latest Range: 0-5 % 0      Basophils Relative Latest Range: 0-1 % 0      Neutrophils Absolute Latest Range: 1.7-7.7 K/uL 10.5 (H)      Lymphocytes Absolute Latest Range: 0.7-4.0 K/uL 1.7      Monocytes Absolute Latest Range: 0.1-1.0 K/uL 0.7      Eosinophils Absolute Latest Range: 0.0-0.7 K/uL 0.1      Basophils Absolute Latest Range: 0.0-0.1 K/uL 0.0      CULTURE, BLOOD (ROUTINE X 2) No range found  Rpt Rpt    CT  ABDOMEN PELVIS WO CONTRAST No range found    Rpt    I/ONo intake or output data in the 24 hours ending 09/28/10 1701   Diet Order: Clear Liquid  Supplements/Tube Feeding: not at this time  IVF:    lactated  ringers Last Rate: 1,000 mL (09/28/10 0650)    Estimated Nutritional Needs:   Kcal:2030-2204 Protein:98-110 grams Fluid:2.1-2.2 L/d  NUTRITION DIAGNOSIS: -Increased nutrient needs (NI-5.1).  Status: Ongoing  RELATED TO:  -Altered GI function  AS EVIDENCE BY: -Colon CA, s/p hemicolectomy with colostomy 10-15 BM per day  MONITORING/EVALUATION(Goals): -Pt will successfully adv to Low Fiber diet. Goal not met.   EDUCATION NEEDS: -Education needs addressed.Will provide recommendations for foods to minimize malabsorption and optimize nutrient intake to prevent further wt loss and potential malnutrition.  INTERVENTION: -Add Resource Breeze TID (provides 250 kcal, 9 gr protein) -ProStat 64 TID (provides 216 kcal, 45 gr protein per day) -Adv to Low Fiber diet as tol  Dietitian [phone]  DOCUMENTATION CODES Per approved criteria  -Underweight    Kathyann Spaugh, Charlann Noss 09/28/2010, 4:58 PM

## 2010-09-28 NOTE — H&P (Signed)
[name], [name] NO.:  [id number]  MEDICAL RECORD NO.:  [id number]  LOCATION:  A324                          FACILITY:  [facility]  PHYSICIAN:  Peter Gracey G. Evren Shankland, MD   DATE OF BIRTH:  1951-01-25  DATE OF ADMISSION:  09/27/2010 DATE OF DISCHARGE:  LH                             HISTORY & PHYSICAL   This 60 year old white male presented to emergency room in early morning hours with abdominal pain and nausea which had been present for some 24 hours and had become gradually worse, abdominal pain located in right upper and left upper quadrant of abdomen, moderately severe.  This patient does have prior history of cirrhosis, cancerous cecum with previous bowel resection for colon cancer.  He did have a CT done on admission which showed evidence of right hemicolectomy, right lower quadrant colostomy., but no evidence of bowel obstruction.  There was a 3.1 cm fluid collection lateral to the left psoas muscle and 2.2 cm fluid collection lateral to right psoas muscle with moderate abdominal pelvic ascites, 2 x 1.6 cm lesion in the midline of the lower abdomen, questionable metastatic disease.  The patient was treated in the ED and subsequently was admitted.  He was given Dilaudid 1 mg IV q.4 h. p.r.n. for pain and Zofran 4 mg q.6 h. p.r.n. for nausea, was started on lactated Ringer infusion, 40 mg of Protonix was ordered IV.  The patient was subsequently admitted.  SOCIAL HISTORY:  The patient does not smoke, has previously used alcohol but not recently.  FAMILY HISTORY:  Father had cirrhosis.  PAST MEDICAL HISTORY:  Prior history of gout, GERD, cirrhosis, questionable alcohol, related colon cancer of cecum since December 2011, acute renal failure with hospitalization 2012, chronic anemia.  SURGICAL PROCEDURES:  The patient had colon surgery for colon cancer previously and exploratory laparotomy for bowel resection April 18, 2010, colon surgery February 24, 2010.  MEDICAL PROBLEMS:  Gout, GERD, cirrhosis, colon cancer, history of EtOH abuse, anemia of chronic disease.  REVIEW OF SYSTEMS:  HEENT:  Negative.  CARDIOPULMONARY:  No shortness of breath.  No cough.  GI: Bouts of nausea and upper abdominal pain for several days.  No dysuria, hematuria.  PHYSICAL EXAMINATION:  VITAL SIGNS:  Blood pressure 97/66, respirations 15, pulse 91, temperature 97.5. GENERAL:  Alert male, the patient is oriented and chronically ill appearing patient. HEENT:  Negative. NECK:  Supple.  No JVD or thyroid abnormalities. HEART:  Regular rhythm.  No cardiomegaly. LUNGS:  Clear to P and A. ABDOMEN:  The patient has colostomy, is tender in upper abdomen. SKIN:  Warm and dry. NEUROLOGIC:  No focal deficit.  ASSESSMENT:  The patient admitted with what was felt to be abscess of psoas muscle.  He does have a history of hemicolectomy and colostomy for colon cancer and prior history of abdominal abscesses.  The patient has markedly low serum potassium.  These issues will be addressed.  The patient will be seen as well by Surgical Service.     Peter [name] G. Peter Matter, MD     AGM/MEDQ  D:  09/28/2010  T:  09/28/2010  Job:  [id number]

## 2010-09-28 NOTE — ED Provider Notes (Signed)
History     CSN: [id number] Arrival date & time: 09/27/2010 10:14 PM  Chief Complaint  Patient presents with  . Abdominal Pain    recent abscess with drain, drain pulled last week  . Nausea   HPI Comments: Seen 2316. Patient with h/o colon cancer s/p colectomy and coloscopy (03/2010). Scheduled to start radiation next week. Currently on epogen for anemia. Has a history of abdominal abscess s/p surgery that required prolong antibiotics and percutaneous drainage for over a month. Pulled drainage tube a week ago Tuesday. Abdominal pain has developed since yesterday. No appetite today. Felt poorly all day. Nausea, no vomiting. No fever, no chills.   Patient is a 60 y.o. male presenting with abdominal pain. The history is provided by the patient.  Abdominal Pain The primary symptoms of the illness include abdominal pain and nausea. The current episode started 13 to 24 hours ago. The onset of the illness was gradual. The problem has been gradually worsening.  The abdominal pain is located in the RUQ, LUQ and epigastric region. The abdominal pain does not radiate. The severity of the abdominal pain is 8/10. The abdominal pain is relieved by nothing.  Associated symptoms comments: fatigue. Associated medical issues comments: h/o colon cancer s/p colectomy and colonoscopy. h/o abdominal abscess with prolonged abx and drainage..    Past Medical History  Diagnosis Date  . Gout   . GERD (gastroesophageal reflux disease)   . Cirrhosis     ?ETOH related  . Colon cancer Dec 2011    Cecum  . ARF (acute renal failure)     hospitalization Jan 2012  . H/O ETOH abuse   . 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 S/P endoscopy Dec 2011    Schatzki's ring, Grade 1 esophageal varices, antral/body erosions  . Anemia of chronic disease 08/10/2010    Past Surgical History  Procedure Date  . Colon surgery 02/24/2010    colon cancer (cecum)  . Exploratory laparotomy  03/03/2010    anastomotic leak, developed EC fistula  . Exploratory laparotomy w/ bowel resection 04/18/2010    ileostomy placed, (hx of EC fistula, anastomotic leak)  . Colostomy   . Colon cancer     Family History  Problem Relation Age of Onset  . Cirrhosis Father     deceased, secondary to ETOH    History  Substance Use Topics  . Smoking status: Never Smoker   . Smokeless tobacco: Not on file  . Alcohol Use: No     in the past      Review of Systems  Gastrointestinal: Positive for nausea and abdominal pain.  All other systems reviewed and are negative.    Physical Exam  BP 89/62  Pulse 114  Temp(Src) 98 F (36.7 C) (Oral)  Resp 24  Ht 5\' 10"  (1.778 m)  Wt 118 lb (53.524 kg)  BMI 16.93 kg/m2  SpO2 99%  Physical Exam  Constitutional: He is oriented to person, place, and time. He appears distressed.       Thin chronically ill appearing man  HENT:  Head: Normocephalic and atraumatic.  Eyes: EOM are normal. Pupils are equal, round, and reactive to light.  Neck: Normal range of motion. Neck supple.  Cardiovascular: Regular rhythm.        tachycardia  Pulmonary/Chest: Effort normal and breath sounds normal.  Abdominal: There is tenderness. There is rebound and guarding.       Patient with right  sided colostomy. Marked tenderness to upper abdomen. He pushes away my hand when I attempt to examine him. Abdomen is NOT rigid.  Neurological: He is alert and oriented to person, place, and time.  Skin: Skin is warm and dry.  Psychiatric:       Depressed affect    ED Course  Procedures  MDM Patient s/p hemicolectomy with colostomy for colon cancer and with h/o recurrent abdominal abscess here with abdominal pain. CT results confirm recurrent fluid collection suspicious for abscess with elevated wbc and left shift. Patient has hypokalemia and continued renal insufficiency.mAnalgesics eased discomfort. Family remains at the bedside. Reviewed all results with family and  patient. MDM Reviewed: previous chart, nursing note and vitals Reviewed previous: labs, x-ray and CT scan Interpretation: labs and CT scan Total time providing critical care: 30-74 minutes. This excludes time spent performing separately reportable procedures and services. Consults: general surgery        Nicoletta Dress. Colon Branch, MD 09/28/10 4782

## 2010-09-29 LAB — CBC
Hemoglobin: 12.3 g/dL — ABNORMAL LOW (ref 13.0–17.0)
MCH: 33.2 pg (ref 26.0–34.0)
MCHC: 34.5 g/dL (ref 30.0–36.0)
Platelets: 112 10*3/uL — ABNORMAL LOW (ref 150–400)
RDW: 15.3 % (ref 11.5–15.5)

## 2010-09-29 LAB — COMPREHENSIVE METABOLIC PANEL
ALT: 6 U/L (ref 0–53)
Albumin: 2.3 g/dL — ABNORMAL LOW (ref 3.5–5.2)
Calcium: 8.9 mg/dL (ref 8.4–10.5)
GFR calc Af Amer: 33 mL/min — ABNORMAL LOW (ref >60)
Glucose, Bld: 102 mg/dL — ABNORMAL HIGH (ref 70–99)
Potassium: 3 mEq/L — ABNORMAL LOW (ref 3.5–5.1)
Sodium: 131 mEq/L — ABNORMAL LOW (ref 135–145)
Total Protein: 5.8 g/dL — ABNORMAL LOW (ref 6.0–8.3)

## 2010-09-29 LAB — BASIC METABOLIC PANEL
CO2: 26 mEq/L (ref 19–32)
Chloride: 92 mEq/L — ABNORMAL LOW (ref 96–112)
Creatinine, Ser: 2.27 mg/dL — ABNORMAL HIGH (ref 0.50–1.35)
Glucose, Bld: 121 mg/dL — ABNORMAL HIGH (ref 70–99)

## 2010-09-29 MED ORDER — PANTOPRAZOLE SODIUM 40 MG PO TBEC
40.0000 mg | DELAYED_RELEASE_TABLET | Freq: Every day | ORAL | Status: DC
  Administered 2010-09-30 – 2010-10-04 (×4): 40 mg via ORAL
  Filled 2010-09-29 (×4): qty 1

## 2010-09-29 MED ORDER — POTASSIUM CHLORIDE 10 MEQ/100ML IV SOLN
10.0000 meq | INTRAVENOUS | Status: AC
  Administered 2010-09-29 (×3): 10 meq via INTRAVENOUS
  Filled 2010-09-29 (×3): qty 100

## 2010-09-29 MED ORDER — CHOLESTYRAMINE LIGHT 4 G PO PACK
4.0000 g | PACK | Freq: Three times a day (TID) | ORAL | Status: DC
  Administered 2010-09-29 – 2010-09-30 (×4): 4 g via ORAL
  Filled 2010-09-29 (×10): qty 1

## 2010-09-29 MED ORDER — SODIUM CHLORIDE 0.9 % IV SOLN
500.0000 mg | INTRAVENOUS | Status: DC
  Administered 2010-09-30 – 2010-10-04 (×5): 0.5 g via INTRAVENOUS
  Filled 2010-09-29 (×7): qty 0.5

## 2010-09-29 MED ORDER — ACETAMINOPHEN 325 MG PO TABS
650.0000 mg | ORAL_TABLET | ORAL | Status: DC | PRN
  Administered 2010-09-29: 650 mg via ORAL
  Filled 2010-09-29: qty 2

## 2010-09-29 NOTE — Progress Notes (Signed)
NAMEVARUN, [name] NO.:  [id number]  MEDICAL RECORD NO.:  [id number]  LOCATION:  A324                          FACILITY:  [facility]  PHYSICIAN:  Umaima Scholten G. Renard Matter, MD   DATE OF BIRTH:  [birth year]  DATE OF PROCEDURE: DATE OF DISCHARGE:  06/28/2010                                PROGRESS NOTE   This patient was admitted to the hospital with what was felt to be abscess of the psoas muscle.  He does have a history of hemicolectomy and colostomy for colon cancer, prior history of abdominal abscesses. The patient had markedly low serum potassium and on admission received supplementary potassium.  He is being seen by Surgical Service.  Has been started on IV antibiotics.  Chemistries:  WBC 11,400 with hemoglobin 11.8.  Most recent potassium was 3.1.  He remains on IV antibiotics, Invanz 1 gram q.24 h., Protonix 40 mg IV daily p.r.n. medication for pain, Dilaudid 1 mg every 4 hours p.r.n., and Zofran 4 mg p.r.n.  Lungs clear.  Heart, regular rhythm.  Hyperactive bowel sounds.  ASSESSMENT:  The patient admitted with what was felt to be abscesses of psoas muscle.  He does have a history of hemicolectomy and colostomy with colon cancer and prior history of abdominal abscesses.  He does have serum potassium.  This will be addressed.  The patient is being seen by Surgical Service.     Manisha Cancel G. Renard Matter, MD     AGM/MEDQ  D:  09/29/2010  T:  09/29/2010  Job:  [id number]

## 2010-09-29 NOTE — Progress Notes (Signed)
Subjective: Pain is slightly better.  No nausea.  No fevers.  Objective: Vital signs in last 24 hours: Temp:  [98.5 F (36.9 C)-98.6 F (37 C)] 98.5 F (36.9 C) (08/09 2230) Pulse Rate:  [83-92] 92  (08/09 2230) Resp:  [17-22] 22  (08/09 2230) BP: (98-101)/(64-65) 98/65 mmHg (08/09 2230) SpO2:  [98 %] 98 % (08/09 2230) Last BM Date: 09/28/10 (Pt has a colostomy)  Intake/Output from previous day: 08/09 0701 - 08/10 0700 In: 700 [P.O.:240; I.V.:460] Out: 775 [Urine:525; Stool:250] Intake/Output this shift: I/O this shift: In: 585 [P.O.:585] Out: -   General appearance: alert and no distress Resp: clear to auscultation bilaterally GI: +BS, soft, flat, mod LLQ tenderness.  No peritoneal signs.  Osotmy fxn.  Lab Results:  @LABLAST2 (wbc:2,hgb:2,hct:2,plt:2) BMET  Basename 09/29/10 0530 09/28/10 0539  NA 131* 131*  K 3.0* 3.1*  CL 90* 87*  CO2 27 29  GLUCOSE 102* 92  BUN 18 23  CREATININE 2.45* 2.57*  CALCIUM 8.9 8.5   PT/INR No results found for this basename: LABPROT:2,INR:2 in the last 72 hours ABG No results found for this basename: PHART:2,PCO2:2,PO2:2,HCO3:2 in the last 72 hours  Studies/Results: Ct Abdomen Pelvis Wo Contrast  09/28/2010  *RADIOLOGY REPORT*  Clinical Data: Abdominal pain, status post right hemicolectomy with colostomy for colon cancer, history of abscess  CT ABDOMEN AND PELVIS WITHOUT CONTRAST  Technique:  Multidetector CT imaging of the abdomen and pelvis was performed following the standard protocol without intravenous contrast.  Comparison: 08/01/2010  Findings: Lung bases are essentially clear.  Unenhanced liver, spleen the pancreas, and adrenal glands are within normal limits.  Gallbladder is unremarkable.  No intrahepatic or extrahepatic ductal dilatation.  Kidneys are unremarkable.  No renal calculi or hydronephrosis.  Status post right hemicolectomy with right lower quadrant colostomy. No evidence of bowel obstruction.  Moderate  abdominopelvic ascites.  Recurrent 3.1 x 2.6 cm fluid collection just lateral to the left psoas (series 2/image 57). Residual 2.2 x 2.2 cm fluid collection lateral to the right psoas.  1.6 x 2.0 cm lesion in the anterior lower abdomen (series 2/image 63), measuring higher than simple fluid density, previously 0.9 x 1.1 cm.  Scattered small mesenteric and retroperitoneal lymph nodes which do not meet pathologic CT size criteria.  No evidence of abdominal aortic aneurysm.  Prostate is unremarkable.  Bladder is thick-walled but underdistended.  Mild degenerative changes of the visualized thoracolumbar spine. No focal osseous lesions.  IMPRESSION: Status post right hemicolectomy with right lower quadrant colostomy for colon cancer.  No evidence of bowel obstruction.  Recurrent 3.1 cm fluid collection lateral to the left psoas. Residual 2.2 cm fluid collection lateral to the right psoas. Moderate abdominopelvic ascites.  2.0 x 1.6 cm lesion in the midline lower abdomen, incompletely characterized in the absence of contrast, increased. Recurrent/metastatic disease is not excluded.  Original Report Authenticated By: Charline Bills, M.D.    Anti-infectives: Anti-infectives     Start     Dose/Rate Route Frequency Ordered Stop   09/29/10 1000   ertapenem (INVANZ) 1 g in sodium chloride 0.9 % 50 mL IVPB        1 g 100 mL/hr over 30 Minutes Intravenous Every 24 hours 09/28/10 0518     09/28/10 0515   ertapenem (INVANZ) 1 g in sodium chloride 0.9 % 50 mL IVPB  Status:  Discontinued        1 g 100 mL/hr over 30 Minutes Intravenous Every 24 hours 09/28/10 0505 09/28/10 0518  Assessment/Plan: s/p  Continue abx.  Will trial advancement of diet.  If limited abx response over weekend will then entertain IR drainage as discussed with patient.  Will resume home meds,  LOS: 2 days    Ayerim Berquist C 09/29/2010

## 2010-09-29 NOTE — Progress Notes (Addendum)
Pharmacy Note:  Invanz adjusted for decrease renal function (CrCr = 21ml/min).  Discussed with Dr. Leticia Penna.  The patient is receiving Protonix by the intravenous route.  Based on criteria approved by the Pharmacy and Therapeutics Committee and the Medical Executive Committee, the medication is being converted to the equivalent oral dose form.  These criteria include: -No Active GI bleeding -Able to tolerate diet of full liquids (or better) or tube feeding -Able to tolerate other medications by the oral or enteral route  If you have any questions about this conversion, please contact the Pharmacy Department (ext [phone]).  Thank you.

## 2010-09-30 LAB — COMPREHENSIVE METABOLIC PANEL
BUN: 17 mg/dL (ref 6–23)
CO2: 29 mEq/L (ref 19–32)
Chloride: 92 mEq/L — ABNORMAL LOW (ref 96–112)
Creatinine, Ser: 2.18 mg/dL — ABNORMAL HIGH (ref 0.50–1.35)
GFR calc Af Amer: 38 mL/min — ABNORMAL LOW (ref >60)
GFR calc non Af Amer: 31 mL/min — ABNORMAL LOW (ref >60)
Total Bilirubin: 0.8 mg/dL (ref 0.3–1.2)

## 2010-09-30 LAB — CBC
HCT: 35.3 % — ABNORMAL LOW (ref 39.0–52.0)
MCH: 33.1 pg (ref 26.0–34.0)
MCV: 96.4 fL (ref 78.0–100.0)
RBC: 3.66 MIL/uL — ABNORMAL LOW (ref 4.22–5.81)
RDW: 15.1 % (ref 11.5–15.5)
WBC: 10.5 10*3/uL (ref 4.0–10.5)

## 2010-09-30 MED ORDER — CHOLESTYRAMINE LIGHT 4 G PO PACK
4.0000 g | PACK | ORAL | Status: DC
  Administered 2010-09-30 – 2010-10-05 (×11): 4 g via ORAL
  Filled 2010-09-30 (×13): qty 1

## 2010-09-30 MED ORDER — POTASSIUM CHLORIDE CRYS ER 20 MEQ PO TBCR
40.0000 meq | EXTENDED_RELEASE_TABLET | Freq: Once | ORAL | Status: AC
  Administered 2010-09-30: 40 meq via ORAL
  Filled 2010-09-30: qty 2

## 2010-09-30 NOTE — Progress Notes (Signed)
  Subjective: Some chills, intermittently.  Pain about the same.  No nausea.  No change with ostomy outpt.  Objective: Vital signs in last 24 hours: Temp:  [97.8 F (36.6 C)-98.9 F (37.2 C)] 97.8 F (36.6 C) (08/11 1000) Pulse Rate:  [100-109] 105  (08/11 1000) Resp:  [16-19] 16  (08/11 1000) BP: (93-111)/(58-74) 99/67 mmHg (08/11 1000) SpO2:  [92 %-100 %] 92 % (08/11 1000) Last BM Date: 09/30/10  Intake/Output from previous day: 08/10 0701 - 08/11 0700 In: 2040 [P.O.:1065; I.V.:975] Out: 1175 [Urine:550; Stool:625] Intake/Output this shift: I/O this shift: In: 260 [P.O.:260] Out: -   General appearance: alert and no distress Resp: clear to auscultation bilaterally GI: soft flat, mod-severe LLQ pain.  No peritoneal signs.  Ostomy fxn.  Lab Results:  @LABLAST2 (wbc:2,hgb:2,hct:2,plt:2) BMET  Basename 09/30/10 0528 09/29/10 1936  NA 133* 131*  K 3.3* 3.6  CL 92* 92*  CO2 29 26  GLUCOSE 80 121*  BUN 17 17  CREATININE 2.18* 2.27*  CALCIUM 9.2 8.9   PT/INR No results found for this basename: LABPROT:2,INR:2 in the last 72 hours ABG No results found for this basename: PHART:2,PCO2:2,PO2:2,HCO3:2 in the last 72 hours  Studies/Results: No results found.  Anti-infectives: Anti-infectives     Start     Dose/Rate Route Frequency Ordered Stop   09/29/10 1000   ertapenem (INVANZ) 1 g in sodium chloride 0.9 % 50 mL IVPB  Status:  Discontinued        1 g 100 mL/hr over 30 Minutes Intravenous Every 24 hours 09/28/10 0518 09/29/10 1255   09/28/10 0515   ertapenem (INVANZ) 1 g in sodium chloride 0.9 % 50 mL IVPB  Status:  Discontinued        1 g 100 mL/hr over 30 Minutes Intravenous Every 24 hours 09/28/10 0505 09/28/10 0518          Assessment/Plan: s/p  IAA.  Clinically not much improvement.  WBC count has normalized however.  Will continue abx for now.  As discused with patient and family, if he fails to progress may need to send to IR for drainage in the next  24-48 hours if no progression.  He is aware but states he wants to attempt IV ABX first.  LOS: 3 days    Peter [name] C 09/30/2010

## 2010-09-30 NOTE — Progress Notes (Signed)
Peter [name], Peter NO.:  [id number]  MEDICAL RECORD NO.:  [id number]  LOCATION:  A324                          FACILITY:  [facility]  PHYSICIAN:  Shalisha Clausing G. Renard Matter, MD   DATE OF BIRTH:  [birth year]  DATE OF PROCEDURE: DATE OF DISCHARGE:                                PROGRESS NOTE   The patient continues to feel fairly comfortably, he remains on IV antibiotics.  He does have what is felt to be abscess of psoas muscles, has a history of hemicolectomy and colostomy for colon cancer, and prior history of abdominal abscesses.  The patient did have potassium repleted yesterday.  He is being seen by Surgical Service.  OBJECTIVE:  VITAL SIGNS:  Blood pressure 111/74, respirations 16, pulse 100, temp 98.9. The patient's current chemistry shows a sodium 131, potassium 3.6, chloride 92, CO2 26, BUN 17, creatinine 2.27. LUNGS:  Diminished breath sounds. HEART:  Regular rhythm. ABDOMEN:  The patient has colostomy.  Has some tenderness in upper abdomen and hyperactive bowel sounds.  ASSESSMENT:  The patient was admitted and was felt to be abscesses on psoas muscles.  He does have a history of hemicolectomy and colostomy with colon cancer, prior history of history of abdominal abscesses, he has had low serum potassium, this has been addressed as more normal now. He remains on IV Invanz 1 g every 24 hours, Protonix 40 mg daily, and receiving Dilaudid 1 mg every 4 hours p.r.n. for pain and Zofran 4 mg p.r.n. for nausea.  He has a positive abscesses that may have to be drained.  Surgical Service have discussed this with the patient.     [name] G. Renard Matter, MD     AGM/MEDQ  D:  09/30/2010  T:  09/30/2010  Job:  [id number]

## 2010-10-01 LAB — COMPREHENSIVE METABOLIC PANEL
Albumin: 2.1 g/dL — ABNORMAL LOW (ref 3.5–5.2)
Alkaline Phosphatase: 122 U/L — ABNORMAL HIGH (ref 39–117)
BUN: 18 mg/dL (ref 6–23)
Chloride: 91 mEq/L — ABNORMAL LOW (ref 96–112)
Glucose, Bld: 108 mg/dL — ABNORMAL HIGH (ref 70–99)
Potassium: 3.9 mEq/L (ref 3.5–5.1)
Total Bilirubin: 0.5 mg/dL (ref 0.3–1.2)

## 2010-10-01 LAB — CBC
HCT: 33.7 % — ABNORMAL LOW (ref 39.0–52.0)
Hemoglobin: 11.5 g/dL — ABNORMAL LOW (ref 13.0–17.0)
RDW: 15.2 % (ref 11.5–15.5)
WBC: 9.3 10*3/uL (ref 4.0–10.5)

## 2010-10-01 MED ORDER — NALOXONE HCL 0.4 MG/ML IJ SOLN: 0.4000 mg | INTRAMUSCULAR | Status: DC | PRN

## 2010-10-01 MED ORDER — DIPHENHYDRAMINE HCL 50 MG/ML IJ SOLN: 12.5000 mg | Freq: Four times a day (QID) | INTRAMUSCULAR | Status: DC | PRN

## 2010-10-01 MED ORDER — SODIUM CHLORIDE 0.9 % IJ SOLN: 9.0000 mL | INTRAMUSCULAR | Status: DC | PRN

## 2010-10-01 MED ORDER — HYDROMORPHONE 0.3 MG/ML IV SOLN
INTRAVENOUS | Status: DC
  Administered 2010-10-01 – 2010-10-02 (×3): 0.3 mg via INTRAVENOUS
  Administered 2010-10-03: 0.9 mg via INTRAVENOUS
  Administered 2010-10-03: 1.5 mg via INTRAVENOUS
  Administered 2010-10-03: 03:00:00 via INTRAVENOUS
  Administered 2010-10-03: 0.3 mg via INTRAVENOUS
  Administered 2010-10-04: 0.9 mg via INTRAVENOUS
  Administered 2010-10-04: 09:00:00 via INTRAVENOUS
  Administered 2010-10-04: 1.2 mg via INTRAVENOUS
  Administered 2010-10-05: 1.5 mg via INTRAVENOUS

## 2010-10-01 MED ORDER — ONDANSETRON HCL 4 MG/2ML IJ SOLN
4.0000 mg | Freq: Four times a day (QID) | INTRAMUSCULAR | Status: DC | PRN
Start: 2010-10-01 — End: 2010-10-05

## 2010-10-01 MED ORDER — DIPHENHYDRAMINE HCL 12.5 MG/5ML PO ELIX: 12.5000 mg | ORAL_SOLUTION | Freq: Four times a day (QID) | ORAL | Status: DC | PRN

## 2010-10-01 MED ORDER — HYDROMORPHONE 0.3 MG/ML IV SOLN
INTRAVENOUS | Status: AC
  Administered 2010-10-01: 0.3 mg via INTRAVENOUS
  Filled 2010-10-01: qty 25

## 2010-10-01 MED ORDER — SODIUM CHLORIDE 0.9 % IJ SOLN
9.0000 mL | INTRAMUSCULAR | Status: DC | PRN
  Filled 2010-10-01: qty 6

## 2010-10-01 NOTE — Progress Notes (Signed)
  Subjective: Pain about the same.  Appetite down.  Denies fevers.  Objective: Vital signs in last 24 hours: Temp:  [98.4 F (36.9 C)-98.9 F (37.2 C)] 98.4 F (36.9 C) (08/12 1000) Pulse Rate:  [105-116] 108  (08/12 1000) Resp:  [16-17] 16  (08/12 1000) BP: (96-106)/(65-68) 98/67 mmHg (08/12 1000) SpO2:  [94 %-96 %] 96 % (08/12 1000) Last BM Date: 10/01/10  Intake/Output from previous day: 08/11 0701 - 08/12 0700 In: 3708 [P.O.:880; I.V.:2778; IV Piggyback:50] Out: 1250 [Urine:500; Stool:750] Intake/Output this shift: I/O this shift: In: 240 [P.O.:240] Out: -   General appearance: alert and no distress GI: Soft.  Mod llq pain.  ostomy fxn.  No peritneal signs.  Lab Results:  @LABLAST2 (wbc:2,hgb:2,hct:2,plt:2) BMET  Basename 10/01/10 0457 09/30/10 0528  NA 129* 133*  K 3.9 3.3*  CL 91* 92*  CO2 24 29  GLUCOSE 108* 80  BUN 18 17  CREATININE 1.98* 2.18*  CALCIUM 9.0 9.2   PT/INR No results found for this basename: LABPROT:2,INR:2 in the last 72 hours ABG No results found for this basename: PHART:2,PCO2:2,PO2:2,HCO3:2 in the last 72 hours  Studies/Results: No results found.  Anti-infectives: Anti-infectives     Start     Dose/Rate Route Frequency Ordered Stop   09/29/10 1000   ertapenem (INVANZ) 1 g in sodium chloride 0.9 % 50 mL IVPB  Status:  Discontinued        1 g 100 mL/hr over 30 Minutes Intravenous Every 24 hours 09/28/10 0518 09/29/10 1255   09/28/10 0515   ertapenem (INVANZ) 1 g in sodium chloride 0.9 % 50 mL IVPB  Status:  Discontinued        1 g 100 mL/hr over 30 Minutes Intravenous Every 24 hours 09/28/10 0505 09/28/10 0518          Assessment/Plan: s/p  IAA.  WBC stable.  Continue abx.  As clinically has not progressed will consult IR for drainage procedure.  Pt willing to consider at this point.  Will also start PCA for better pain management.  LOS: 4 days    Peter [name] C 10/01/2010

## 2010-10-02 ENCOUNTER — Telehealth: Payer: Self-pay | Admitting: Gastroenterology

## 2010-10-02 ENCOUNTER — Encounter (HOSPITAL_COMMUNITY): Payer: Medicaid Other

## 2010-10-02 ENCOUNTER — Ambulatory Visit: Payer: Medicaid Other | Admitting: Gastroenterology

## 2010-10-02 ENCOUNTER — Inpatient Hospital Stay (HOSPITAL_COMMUNITY)
Admission: AD | Admit: 2010-10-02 | Discharge: 2010-10-02 | Disposition: A | Discharge status: Home / Self Care | Payer: Medicaid Other | Source: Other Acute Inpatient Hospital | Attending: General Surgery | Admitting: General Surgery

## 2010-10-02 LAB — PROTIME-INR: INR: 1.31 (ref 0.00–1.49)

## 2010-10-02 LAB — COMPREHENSIVE METABOLIC PANEL
AST: 11 U/L (ref 0–37)
Albumin: 1.9 g/dL — ABNORMAL LOW (ref 3.5–5.2)
Calcium: 8.8 mg/dL (ref 8.4–10.5)
Chloride: 95 mEq/L — ABNORMAL LOW (ref 96–112)
Creatinine, Ser: 1.9 mg/dL — ABNORMAL HIGH (ref 0.50–1.35)
Total Protein: 5.5 g/dL — ABNORMAL LOW (ref 6.0–8.3)

## 2010-10-02 LAB — CBC
MCHC: 34.2 g/dL (ref 30.0–36.0)
MCV: 97.6 fL (ref 78.0–100.0)
Platelets: 133 10*3/uL — ABNORMAL LOW (ref 150–400)
RDW: 14.9 % (ref 11.5–15.5)
WBC: 7.9 10*3/uL (ref 4.0–10.5)

## 2010-10-02 LAB — APTT: aPTT: 46 seconds — ABNORMAL HIGH (ref 24–37)

## 2010-10-02 MED ORDER — LACTATED RINGERS IV BOLUS (SEPSIS)
1000.0000 mL | Freq: Once | INTRAVENOUS | Status: AC
  Administered 2010-10-02: 1000 mL via INTRAVENOUS

## 2010-10-02 MED ORDER — HEPARIN SOD (PORK) LOCK FLUSH 100 UNIT/ML IV SOLN
INTRAVENOUS | Status: AC
  Filled 2010-10-02: qty 5

## 2010-10-02 NOTE — Progress Notes (Signed)
Spoke with pt's wife, Reshad Saab. Pt had called her confused, unsure of where he was, very anxious. Gave xanax with sip of h2o per request from wife.

## 2010-10-02 NOTE — Progress Notes (Signed)
  Subjective: Still complaining of left-sided abdominal pain. Nausea has decreased.  Objective: Vital signs in last 24 hours: Temp:  [97.9 F (36.6 C)-100.3 F (37.9 C)] 97.9 F (36.6 C) (08/13 0958) Pulse Rate:  [110-133] 117  (08/13 0958) Resp:  [16-20] 20  (08/13 0958) BP: (95-110)/(57-68) 102/68 mmHg (08/13 0958) SpO2:  [91 %-96 %] 95 % (08/13 0958) FiO2 (%):  [95 %] 95 % (08/13 0346) Last BM Date: 10/01/10  Intake/Output from previous day: 08/12 0701 - 08/13 0700 In: 1405 [P.O.:600; I.V.:805] Out: 1900 [Urine:650; Stool:1250] Intake/Output this shift:   Abdomen: Soft, flat. Ileostomy patent. Mild left-sided abdominal pain to palpation. No rigidity noted.   Lab Results:   [facility] 10/02/10 0509 10/01/10 0457  WBC 7.9 9.3  HGB 11.1* 11.5*  HCT 32.5* 33.7*  PLT 133* 133*   BMET  Basename 10/02/10 0509 10/01/10 0457  NA 131* 129*  K 4.0 3.9  CL 95* 91*  CO2 24 24  GLUCOSE 94 108*  BUN 20 18  CREATININE 1.90* 1.98*  CALCIUM 8.8 9.0   Anti-infectives: Anti-infectives     Start     Dose/Rate Route Frequency Ordered Stop   09/29/10 1000   ertapenem (INVANZ) 1 g in sodium chloride 0.9 % 50 mL IVPB  Status:  Discontinued        1 g 100 mL/hr over 30 Minutes Intravenous Every 24 hours 09/28/10 0518 09/29/10 1255   09/28/10 0515   ertapenem (INVANZ) 1 g in sodium chloride 0.9 % 50 mL IVPB  Status:  Discontinued        1 g 100 mL/hr over 30 Minutes Intravenous Every 24 hours 09/28/10 0505 09/28/10 0518          Assessment/Plan: Assessment: Renal insufficiency has improved significantly. Hyponatremia has improved. Still having some mild fever despite normal white blood cell count. Plan: Hopefully will undergo CT-guided drainage of the left psosas intra-abdominal abscess today. I have discussed this with the physician assistant of the interventional radiology Department at [facility]. Continue intravenous hydration and Invanz pending microbiology results.  LOS: 5  days    Allysson Rinehimer A 10/02/2010

## 2010-10-02 NOTE — Progress Notes (Signed)
Peter [name], FENDLEY NO.:  [id number]  MEDICAL RECORD NO.:  [id number]  LOCATION:  A324                          FACILITY:  [facility]  PHYSICIAN:  Kambra Beachem G. Renard Matter, MD   DATE OF BIRTH:  [birth year]  DATE OF PROCEDURE:  10/02/2010 DATE OF DISCHARGE:                                PROGRESS NOTE   This patient seems fairly comfortable this morning, but states that he continues to have some abdominal pain and remains on IV antibiotics. Does have abscess of psoas muscles.  Does have a history of hemicolectomy, colostomy for colon cancer, prior history of abdominal abscesses.  He still has slightly low serum potassium.  OBJECTIVE:  VITAL SIGNS:  Blood pressure 110/64, respirations 18, pulse 123, temperature of 100.3. The patient's monitor most recent potassium was 3.9, creatinine 1.98. CBC, WBC 9300 with hemoglobin 11.5, hematocrit 33.7. HEART:  Regular rhythm. LUNGS:  Clear to P and A. ABDOMEN:  Tenderness in upper abdomen.  The patient does have colostomy.  ASSESSMENT:  The patient continues to be treated for abscesses in the psoas muscles of abdomen.  Has a history of hemicolectomy, colostomy, colon cancer.  The patient is being seen by Surgical Service and may have drainage of abscesses.     [name] Matter, MD     AGM/MEDQ  D:  10/02/2010  T:  10/02/2010  Job:  [id number]

## 2010-10-03 ENCOUNTER — Other Ambulatory Visit: Payer: Medicaid Other

## 2010-10-03 ENCOUNTER — Encounter (HOSPITAL_COMMUNITY): Payer: Medicaid Other | Admitting: Oncology

## 2010-10-03 LAB — COMPREHENSIVE METABOLIC PANEL
Albumin: 1.8 g/dL — ABNORMAL LOW (ref 3.5–5.2)
BUN: 17 mg/dL (ref 6–23)
CO2: 24 mEq/L (ref 19–32)
Chloride: 96 mEq/L (ref 96–112)
Creatinine, Ser: 1.74 mg/dL — ABNORMAL HIGH (ref 0.50–1.35)
GFR calc Af Amer: 49 mL/min — ABNORMAL LOW (ref >60)
GFR calc non Af Amer: 41 mL/min — ABNORMAL LOW (ref >60)
Glucose, Bld: 120 mg/dL — ABNORMAL HIGH (ref 70–99)
Total Bilirubin: 0.4 mg/dL (ref 0.3–1.2)

## 2010-10-03 LAB — CULTURE, BLOOD (ROUTINE X 2)

## 2010-10-03 LAB — BASIC METABOLIC PANEL
CO2: 24 mEq/L (ref 19–32)
Chloride: 96 mEq/L (ref 96–112)
Potassium: 3.9 mEq/L (ref 3.5–5.1)
Sodium: 133 mEq/L — ABNORMAL LOW (ref 135–145)

## 2010-10-03 LAB — CBC
HCT: 30.8 % — ABNORMAL LOW (ref 39.0–52.0)
HCT: 32.5 % — ABNORMAL LOW (ref 39.0–52.0)
MCV: 97.5 fL (ref 78.0–100.0)
MCV: 98.2 fL (ref 78.0–100.0)
Platelets: 144 10*3/uL — ABNORMAL LOW (ref 150–400)
RBC: 3.16 MIL/uL — ABNORMAL LOW (ref 4.22–5.81)
RDW: 14.9 % (ref 11.5–15.5)
WBC: 9.3 10*3/uL (ref 4.0–10.5)
WBC: 9.3 10*3/uL (ref 4.0–10.5)

## 2010-10-03 LAB — DIFFERENTIAL
Eosinophils Relative: 0 % (ref 0–5)
Lymphocytes Relative: 9 % — ABNORMAL LOW (ref 12–46)
Lymphs Abs: 0.8 10*3/uL (ref 0.7–4.0)

## 2010-10-03 MED ORDER — ENOXAPARIN SODIUM 30 MG/0.3ML ~~LOC~~ SOLN
30.0000 mg | SUBCUTANEOUS | Status: DC
  Administered 2010-10-03 – 2010-10-05 (×3): 30 mg via SUBCUTANEOUS
  Filled 2010-10-03 (×3): qty 0.3

## 2010-10-03 MED ORDER — ENSURE CLINICAL ST REVIGOR PO LIQD
237.0000 mL | ORAL | Status: DC
  Administered 2010-10-03 – 2010-10-04 (×2): 237 mL via ORAL

## 2010-10-03 MED ORDER — SODIUM CHLORIDE 0.9 % IJ SOLN
INTRAMUSCULAR | Status: AC
  Filled 2010-10-03: qty 10

## 2010-10-03 MED ORDER — HYDROMORPHONE 0.3 MG/ML IV SOLN
INTRAVENOUS | Status: AC
  Filled 2010-10-03: qty 25

## 2010-10-03 MED ORDER — BOOST / RESOURCE BREEZE PO LIQD
1.0000 | Freq: Two times a day (BID) | ORAL | Status: DC
  Administered 2010-10-03 – 2010-10-04 (×3): 1 via ORAL

## 2010-10-03 NOTE — Progress Notes (Signed)
FOLLOW UP ADULT NUTRITION ASSESSMENT Date: 10/03/2010   Time: 10:37 AM Reason for Assessment:H/O wt loss, poor diet tol   ASSESSMENT: Male 60 y.o.  Dx: <principal problem not specified>   Past Medical History  Diagnosis Date  . Gout   . GERD (gastroesophageal reflux disease)   . Cirrhosis     ?ETOH related  . Colon cancer Dec 2011    Cecum  . ARF (acute renal failure)     hospitalization Jan 2012  . H/O ETOH abuse   . 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 S/P endoscopy Dec 2011    Schatzki's ring, Grade 1 esophageal varices, antral/body erosions  . Anemia of chronic disease 08/10/2010    Scheduled Meds:    . cholestyramine light  4 g Oral Custom  . enoxaparin  30 mg Subcutaneous Q24H  . ertapenem  500 mg Intravenous Q24H  . feeding supplement  30 mL Oral TID WC  . feeding supplement  1 Container Oral TID WC  . HYDROmorphone PCA 0.3 mg/mL   Intravenous Q4H  . lactated ringers  1,000 mL Intravenous Once  . multivitamin  5 mL Oral Daily  . pantoprazole  40 mg Oral Q1200   Continuous Infusions:    . lactated ringers 115 mL/hr at 10/03/10 0915   PRN Meds:.acetaminophen, ALPRAZolam, diphenhydrAMINE, diphenhydrAMINE, diphenhydrAMINE, diphenhydrAMINE, HYDROmorphone, naloxone, naloxone, ondansetron, ondansetron (ZOFRAN) IV, sodium chloride, sodium chloride  Ht: 5\' 9"  (175.3 cm)  Wt: 128 lb (58.06 kg)  10/03/10 142 lb. 9.6 oz.   Ideal Wt: 66.2 kg 70.7 kg % Ideal Wt: 97.9%  Usual Wt: 125-130# % Usual Wt: 111%  Body mass index is 18.90 kg/(m^2).  10/03/10 BMI is 21.04  09/28/2010 Food/Nutrition Related Hx: Pt known to staff from previous admissions. Spouse reports appetite is excellent however, "everything goes into colostomy bag". Pt averages 10-15 BM per day. She reports frequently liquid consistency except with rice (says that the rice pushes the colostomy bag off every time). Pt doesn't like Ensure but is willing to try  Breeze and ProStat 64 while on clears and Magic Cup once diet is advanced. Pt is high risk for malnutrition. Spouse is eager to receive nutrition education r/t foods to help with preventing frequent BM's an wt loss.  10/03/2010 Pt was alert and wife was present. Wife brought fast-food for lunch and Pt had eaten 50% of a cheeseburger. Wife reported that after admission appetite had declined but has recently improved; Pt ate 100% of breakfast. Wife reported that Pt is receiving MVI, Pro-Stat, and Resource Breeze. Pt's weight has increased by 14 pounds since admission. Increased needs cont but appetite and intake are improving. Will cont nutritional supplements and monitor tolerance of food and supplements to ensure adequate nutrient intake. Rec switching Breeze supplements to be received TID between meals to promote increased absorption. Also provided chocolate ensure at lunch time to test tolerance in case he wants to try something different and more calorie-dense.    Labs:  Results for KENSON, GROH (MRN [id number]) as of 10/03/2010 10:37  Ref. Range 09/29/2010 19:36 09/30/2010 05:28 10/01/2010 04:57 10/02/2010 05:09 10/02/2010 11:50 10/02/2010 15:22 10/02/2010 22:59 10/03/2010 05:31  Sodium Latest Range: 135-145 mEq/L 131 (L) 133 (L) 129 (L) 131 (L)   133 (L) 133 (L)  Potassium Latest Range: 3.5-5.1 mEq/L 3.6 3.3 (L) 3.9 4.0   3.9 4.0  Chloride Latest Range: 96-112 mEq/L 92 (L) 92 (L) 91 (L) 95 (L)  96 96  CO2 Latest Range: 19-32 mEq/L 26 29 24 24   24 24   BUN Latest Range: 6-23 mg/dL 17 17 18 20   17 17   Creat Latest Range: 0.50-1.35 mg/dL 1.61 (H) 0.96 (H) 0.45 (H) 1.90 (H)   1.76 (H) 1.74 (H)  Calcium Latest Range: 8.4-10.5 mg/dL 8.9 9.2 9.0 8.8   8.7 9.0  GFR calc non Af Amer Latest Range: >60 mL/min 30 (L) 31 (L) 35 (L) 37 (L)   40 (L) 41 (L)  GFR calc Af Amer Latest Range: >60 mL/min 36 (L) 38 (L) 42 (L) 44 (L)   49 (L) 49 (L)  Glucose Latest Range: 70-99 mg/dL 409 (H) 80 811 (H) 94   100 (H) 120  (H)  Alkaline Phosphatase Latest Range: 39-117 U/L  98 122 (H) 138 (H)    131 (H)  Albumin Latest Range: 3.5-5.2 g/dL  2.3 (L) 2.1 (L) 1.9 (L)    1.8 (L)  AST Latest Range: 0-37 U/L  12 12 11    12   ALT Latest Range: 0-53 U/L  6 5 5    5   Total Protein Latest Range: 6.0-8.3 g/dL  5.9 (L) 5.7 (L) 5.5 (L)    5.6 (L)  Total Bilirubin Latest Range: 0.3-1.2 mg/dL  0.8 0.5 0.5    0.4  WBC Latest Range: 4.0-10.5 K/uL  10.5 9.3 7.9   9.3 9.3  RBC Latest Range: 4.22-5.81 MIL/uL  3.66 (L) 3.47 (L) 3.33 (L)   3.16 (L) 3.31 (L)  HGB Latest Range: 13.0-17.0 g/dL  91.4 (L) 78.2 (L) 95.6 (L)   10.4 (L) 11.0 (L)  HCT Latest Range: 39.0-52.0 %  35.3 (L) 33.7 (L) 32.5 (L)   30.8 (L) 32.5 (L)  MCV Latest Range: 78.0-100.0 fL  96.4 97.1 97.6   97.5 98.2  MCH Latest Range: 26.0-34.0 pg  33.1 33.1 33.3   32.9 33.2  MCHC Latest Range: 30.0-36.0 g/dL  21.3 08.6 57.8   46.9 33.8  RDW Latest Range: 11.5-15.5 %  15.1 15.2 14.9   14.9 14.9  Platelets Latest Range: 150-400 K/uL  118 (L) 133 (L) 133 (L)   144 (L) 151  Neutrophils Relative Latest Range: 43-77 %       83 (H)   Lymphocytes Relative Latest Range: 12-46 %       9 (L)   Monocytes Relative Latest Range: 3-12 %       8   Eosinophils Relative Latest Range: 0-5 %       0   Basophils Relative Latest Range: 0-1 %       0   Neutrophils Absolute Latest Range: 1.7-7.7 K/uL       7.7   Lymphocytes Absolute Latest Range: 0.7-4.0 K/uL       0.8   Monocytes Absolute Latest Range: 0.1-1.0 K/uL       0.7   Eosinophils Absolute Latest Range: 0.0-0.7 K/uL       0.0   Basophils Absolute Latest Range: 0.0-0.1 K/uL       0.0   Prothrombin Time Latest Range: 11.6-15.2 seconds     16.5 (H)     INR Latest Range: 0.00-1.49      1.31     aPTT Latest Range: 24-37 seconds     46 (H)     CULTURE, ROUTINE-ABSCESS No range found      Rpt      I/O  Intake/Output Summary (Last 24 hours) at 10/03/10 1037 Last data filed  at 10/03/10 0915  Gross per 24 hour  Intake   2520 ml    Output   1170 ml  Net   1350 ml     Diet Order: General  Supplements/Tube Feeding: not at this time  IVF:     lactated ringers Last Rate: 115 mL/hr at 10/03/10 0915    Estimated Nutritional Needs:   Kcal:2030-2204 Protein:98-110 grams Fluid:2.1-2.2 L/d  NUTRITION DIAGNOSIS: -Increased nutrient needs (NI-5.1).  Status: Ongoing  RELATED TO:  -Altered GI function  AS EVIDENCE BY: -Colon CA, s/p hemicolectomy with colostomy 10-15 BM per day  MONITORING/EVALUATION(Goals): -Pt will successfully adv to Low Fiber diet.  10/03/2010 Pt has been adv to Regular diet.  EDUCATION NEEDS: -Education needs addressed.Will provide recommendations for foods to minimize malabsorption and optimize nutrient intake to prevent further wt loss and potential malnutrition. 10/03/2010 Colostomy nutrition education was provided to the wife and patient. Rec small frequent meals and identified foods that will promote thickening of the stools such as bananas, applesauce, and rice, and Rec adequate fluid intake.   INTERVENTION: -Add Resource Breeze TID (provides 250 kcal, 9 gr protein)  10/03/2010 Will provide between meals -ProStat 64 TID (provides 216 kcal, 45 gr protein per day) -Adv to Low Fiber diet as tol  Dietitian [phone]  DOCUMENTATION CODES Per approved criteria  -Underweight    Peter [name] 10/03/2010, 10:37 AM

## 2010-10-03 NOTE — Telephone Encounter (Signed)
Please offer patient to reschedule.

## 2010-10-03 NOTE — Progress Notes (Signed)
Subjective: Patient feels better after undergoing CT guided drainage of abscess. Appetite has improved. Still has pain on the left side, though he says it's somewhat decreased.  Objective: Vital signs in last 24 hours: Temp:  [97.6 F (36.4 C)-99.9 F (37.7 C)] 98.3 F (36.8 C) (08/14 0548) Pulse Rate:  [106-136] 117  (08/14 0548) Resp:  [16-20] 19  (08/14 0800) BP: (93-105)/(59-74) 104/74 mmHg (08/14 0548) SpO2:  [85 %-100 %] 96 % (08/14 0800) FiO2 (%):  [96 %-97 %] 97 % (08/13 2000) Last BM Date: 10/02/10  Intake/Output from previous day: 08/13 0701 - 08/14 0700 In: 2040 [P.O.:60; I.V.:980; IV Piggyback:1000] Out: 1170 [Urine:300; Drains:70; Stool:800] Intake/Output this shift: I/O this shift: In: 480 [P.O.:480] Out: -   General appearance: alert, cooperative and no distress Abdomen: Soft, flat. Cloudy white fluid in the Jackson-Pratt drain. No rigidity noted.  Lab Results:   [facility] 10/03/10 0531 10/02/10 2259  WBC 9.3 9.3  HGB 11.0* 10.4*  HCT 32.5* 30.8*  PLT 151 144*   BMET  Basename 10/03/10 0531 10/02/10 2259  NA 133* 133*  K 4.0 3.9  CL 96 96  CO2 24 24  GLUCOSE 120* 100*  BUN 17 17  CREATININE 1.74* 1.76*  CALCIUM 9.0 8.7   PT/INR  Basename 10/02/10 1150  LABPROT 16.5*  INR 1.31    Studies/Results: Ct Guided Abscess Drain  10/02/2010  *RADIOLOGY REPORT*  Indication: Recurrent left pelvic/retroperitoneal abscess.  CT GUIDED LEFT LOWER QUADRANT ABDOMINAL DRAIN PLACEMENT  Comparision: CT guided drain placement - 08/03/2010; CT of the abdomen and pelvis - 07/29/2010  Medications: Fentanyl 2 mcg IV; Versed 50 mg IV  Total Moderate Sedation time: 30 minutes  CT fluoroscopy time:  7 seconds  Contrast: None  Complications: None immediate  Technique / Findings:  Informed written consent was obtained from the patient's wife after a discussion of the risks, benefits and alternatives to treatment. The patient was placed supine on the CT gantry and a  preprocedural CT was performed re-demonstrating the known abscess/fluid collection within the left lower abdominal quadrant adjacent to the left psoas muscle.  The procedure was planned.   A timeout was performed prior to the iniation of the procedure.  The left lower abdominal quadrant prepped and draped in the usual sterile fashion.   The overlying soft tissues were anesthesized with 1% lidocaine.  An 18 guage, trocar needle was advanced in to the abscess/fluid collection under intermittent CT fluoroscopic guidance.  A short Amplatz super stiff wire was coiled within the abscess/fluid collection.   Appropriate positioning was confirmed with a limited CT scan.  The tract was serially dilated allowing placement of a 10 [place] all-purpose drainage catheter. Appropriate positioning was confirmed with a limited postprocedural CT scan.  18 ml of purulent fluid was aspirated.  The tube was connected to a drainage bulb and sutured in place.  A dressing was placed.  The patient tolerated the procedure well without immediate post procedural complication.  Impression:     1.    Successful CT guided placement of a 10 French all purpose       drain catheter into the left lower abdominal quadrant.        2.    Successful aspiration of 18 mL of purulent fluid.   Samples were sent to the laboratory for analysis. Original Report Authenticated By: Waynard Reeds, M.D.    Anti-infectives: Anti-infectives     Start     Dose/Rate Route Frequency Ordered Stop  09/29/10 1000   ertapenem (INVANZ) 1 g in sodium chloride 0.9 % 50 mL IVPB  Status:  Discontinued        1 g 100 mL/hr over 30 Minutes Intravenous Every 24 hours 09/28/10 0518 09/29/10 1255   09/28/10 0515   ertapenem (INVANZ) 1 g in sodium chloride 0.9 % 50 mL IVPB  Status:  Discontinued        1 g 100 mL/hr over 30 Minutes Intravenous Every 24 hours 09/28/10 0505 09/28/10 0518          Assessment/Plan: s/p CT-guided drainage of abdominal abscess Patient  appears better today. We'll try to ambulate in the hallway. Plan: May undergo CT of the abdomen with rectal contrast to assess for a fistula. Further management pending culture results. We'll add Diflucan empirically.  LOS: 6 days    Miana Politte A 10/03/2010

## 2010-10-03 NOTE — Progress Notes (Signed)
Heart rate elevated at 119.  Dr. Renard Matter made aware.  Will continue to monitor.

## 2010-10-03 NOTE — Progress Notes (Signed)
FOLLOW UP ADULT NUTRITION ASSESSMENT Date: 10/03/2010   Time: 2:19 PM Reason for Assessment:H/O wt loss, poor diet tol   ASSESSMENT: Male 60 y.o.  Dx: <principal problem not specified>   Past Medical History  Diagnosis Date  . Gout   . GERD (gastroesophageal reflux disease)   . Cirrhosis     ?ETOH related  . Colon cancer Dec 2011    Cecum  . ARF (acute renal failure)     hospitalization Jan 2012  . H/O ETOH abuse   . 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 S/P endoscopy Dec 2011    Schatzki's ring, Grade 1 esophageal varices, antral/body erosions  . Anemia of chronic disease 08/10/2010    Scheduled Meds:    . cholestyramine light  4 g Oral Custom  . enoxaparin  30 mg Subcutaneous Q24H  . ertapenem  500 mg Intravenous Q24H  . feeding supplement  30 mL Oral TID WC  . feeding supplement  1 Container Oral TID WC  . HYDROmorphone PCA 0.3 mg/mL   Intravenous Q4H  . lactated ringers  1,000 mL Intravenous Once  . multivitamin  5 mL Oral Daily  . pantoprazole  40 mg Oral Q1200   Continuous Infusions:    . lactated ringers 115 mL/hr at 10/03/10 0915   PRN Meds:.acetaminophen, ALPRAZolam, diphenhydrAMINE, diphenhydrAMINE, diphenhydrAMINE, diphenhydrAMINE, HYDROmorphone, naloxone, naloxone, ondansetron, ondansetron (ZOFRAN) IV, sodium chloride, sodium chloride  Ht: 5\' 9"  (175.3 cm)  Wt: 142 lb 9.6 oz (64.683 kg)  10/03/10 142 lb. 9.6 oz.   Ideal Wt: 66.2 kg 70.7 kg % Ideal Wt: 97.9%  Usual Wt: 125-130# % Usual Wt: 111%  Body mass index is 21.06 kg/(m^2).  10/03/10 BMI is 21.04  09/28/2010 Food/Nutrition Related Hx: Pt known to staff from previous admissions. Spouse reports appetite is excellent however, "everything goes into colostomy bag". Pt averages 10-15 BM per day. She reports frequently liquid consistency except with rice (says that the rice pushes the colostomy bag off every time). Pt doesn't like Ensure but is willing  to try Breeze and ProStat 64 while on clears and Magic Cup once diet is advanced. Pt is high risk for malnutrition. Spouse is eager to receive nutrition education r/t foods to help with preventing frequent BM's an wt loss.  10/03/2010 Pt was alert and wife was present. Wife brought fast-food for lunch and Pt had eaten 50% of a cheeseburger. Wife reported that after admission appetite had declined but has recently improved; Pt ate 100% of breakfast. Wife reported that Pt is receiving MVI, Pro-Stat, and Resource Breeze. Pt's weight has increased by 14#, 11% x5d;likely r/t fluid incr. Stool output decr. Increased nutritient needs cont but appetite and intake are improving. Will cont nutritional supplements and monitor tolerance of food and supplements to ensure adequate nutrient intake. Rec switching Breeze supplements to be received TID between meals to promote increased absorption. Also provided chocolate ensure at lunch time to test tolerance in case he wants to try something different and more calorie-dense.    Labs:  Results for KAMIN, NIBLACK (MRN [id number]) as of 10/03/2010 10:37  Ref. Range 09/29/2010 19:36 09/30/2010 05:28 10/01/2010 04:57 10/02/2010 05:09 10/02/2010 11:50 10/02/2010 15:22 10/02/2010 22:59 10/03/2010 05:31  Sodium Latest Range: 135-145 mEq/L 131 (L) 133 (L) 129 (L) 131 (L)   133 (L) 133 (L)  Potassium Latest Range: 3.5-5.1 mEq/L 3.6 3.3 (L) 3.9 4.0   3.9 4.0  Chloride Latest Range: 96-112 mEq/L  92 (L) 92 (L) 91 (L) 95 (L)   96 96  CO2 Latest Range: 19-32 mEq/L 26 29 24 24   24 24   BUN Latest Range: 6-23 mg/dL 17 17 18 20   17 17   Creat Latest Range: 0.50-1.35 mg/dL 1.47 (H) 8.29 (H) 5.62 (H) 1.90 (H)   1.76 (H) 1.74 (H)  Calcium Latest Range: 8.4-10.5 mg/dL 8.9 9.2 9.0 8.8   8.7 9.0  GFR calc non Af Amer Latest Range: >60 mL/min 30 (L) 31 (L) 35 (L) 37 (L)   40 (L) 41 (L)  GFR calc Af Amer Latest Range: >60 mL/min 36 (L) 38 (L) 42 (L) 44 (L)   49 (L) 49 (L)  Glucose Latest Range:  70-99 mg/dL 130 (H) 80 865 (H) 94   100 (H) 120 (H)  Alkaline Phosphatase Latest Range: 39-117 U/L  98 122 (H) 138 (H)    131 (H)  Albumin Latest Range: 3.5-5.2 g/dL  2.3 (L) 2.1 (L) 1.9 (L)    1.8 (L)  AST Latest Range: 0-37 U/L  12 12 11    12   ALT Latest Range: 0-53 U/L  6 5 5    5   Total Protein Latest Range: 6.0-8.3 g/dL  5.9 (L) 5.7 (L) 5.5 (L)    5.6 (L)  Total Bilirubin Latest Range: 0.3-1.2 mg/dL  0.8 0.5 0.5    0.4  WBC Latest Range: 4.0-10.5 K/uL  10.5 9.3 7.9   9.3 9.3  RBC Latest Range: 4.22-5.81 MIL/uL  3.66 (L) 3.47 (L) 3.33 (L)   3.16 (L) 3.31 (L)  HGB Latest Range: 13.0-17.0 g/dL  78.4 (L) 69.6 (L) 29.5 (L)   10.4 (L) 11.0 (L)  HCT Latest Range: 39.0-52.0 %  35.3 (L) 33.7 (L) 32.5 (L)   30.8 (L) 32.5 (L)  MCV Latest Range: 78.0-100.0 fL  96.4 97.1 97.6   97.5 98.2  MCH Latest Range: 26.0-34.0 pg  33.1 33.1 33.3   32.9 33.2  MCHC Latest Range: 30.0-36.0 g/dL  28.4 13.2 44.0   10.2 33.8  RDW Latest Range: 11.5-15.5 %  15.1 15.2 14.9   14.9 14.9  Platelets Latest Range: 150-400 K/uL  118 (L) 133 (L) 133 (L)   144 (L) 151  Neutrophils Relative Latest Range: 43-77 %       83 (H)   Lymphocytes Relative Latest Range: 12-46 %       9 (L)   Monocytes Relative Latest Range: 3-12 %       8   Eosinophils Relative Latest Range: 0-5 %       0   Basophils Relative Latest Range: 0-1 %       0   Neutrophils Absolute Latest Range: 1.7-7.7 K/uL       7.7   Lymphocytes Absolute Latest Range: 0.7-4.0 K/uL       0.8   Monocytes Absolute Latest Range: 0.1-1.0 K/uL       0.7   Eosinophils Absolute Latest Range: 0.0-0.7 K/uL       0.0   Basophils Absolute Latest Range: 0.0-0.1 K/uL       0.0   Prothrombin Time Latest Range: 11.6-15.2 seconds     16.5 (H)     INR Latest Range: 0.00-1.49      1.31     aPTT Latest Range: 24-37 seconds     46 (H)     CULTURE, ROUTINE-ABSCESS No range found      Rpt      I/O  Intake/Output  Summary (Last 24 hours) at 10/03/10 1419 Last data filed at 10/03/10  1300  Gross per 24 hour  Intake   2820 ml  Output   1170 ml  Net   1650 ml     Diet Order: General- Regular  Supplements/Tube Feeding: Breeze TID, ProStat 30 ml TID  IVF:     lactated ringers Last Rate: 115 mL/hr at 10/03/10 0915    Estimated Nutritional Needs:   Kcal:2030-2204 Protein:98-110 grams Fluid:2.1-2.2 L/d  NUTRITION DIAGNOSIS: -Increased nutrient needs (NI-5.1).  Status: Ongoing  RELATED TO:  -Altered GI function  AS EVIDENCE BY: -Colon CA, s/p hemicolectomy with colostomy  MONITORING/EVALUATION(Goals): -Pt will successfully adv to Low Fiber diet.  10/03/2010 Pt has been adv to Regular diet.  EDUCATION NEEDS: -Education needs addressed.Will provide recommendations for foods to minimize malabsorption and optimize nutrient intake to prevent further wt loss and potential malnutrition. 10/03/2010 Colostomy nutrition education was provided to the wife and patient. Rec small frequent meals and identified foods that will promote thickening of the stools such as bananas, applesauce, and rice, and Rec adequate fluid intake. Spouse expressed understanding and questions were answered. Handouts provided.  INTERVENTION: -Add Resource Breeze TID (provides 250 kcal, 9 gr protein)   -10/03/2010 Will provide between meals -ProStat 64 TID (provides 216 kcal, 45 gr protein per day) -Adv to Low Fiber diet as tol  Dietitian [phone]  DOCUMENTATION CODES Per approved criteria  -Underweight    Peter [name], Charlann Noss 10/03/2010, 2:19 PM

## 2010-10-03 NOTE — Progress Notes (Signed)
Heart rate running 125-132.  MD made aware, orders given and followed.

## 2010-10-03 NOTE — Progress Notes (Signed)
NAMERONRICO, DUPIN NO.:  [id number]  MEDICAL RECORD NO.:  [id number]  LOCATION:  A324                          FACILITY:  [facility]  PHYSICIAN:  Caeson Filippi G. Renard Matter, MD   DATE OF BIRTH:  04-Jul-1950  DATE OF PROCEDURE: DATE OF DISCHARGE:                                PROGRESS NOTE   This patient had a CT-guided drainage of left psoas abscess yesterday successfully.  He is fairly comfortable this morning, but does have sinus tachycardia.  OBJECTIVE:  VITAL SIGNS:  Blood pressure 104/74, respirations 18, pulse 117, temperature 98.3. LUNGS:  Diminished breath sounds. HEART:  Sinus tachycardia. ABDOMEN:  The patient has tenderness in the upper abdomen and colostomy.  ASSESSMENT:  The patient continues to be treated for abscesses of the psoas muscle and the abdomen.  Does have a history hemicolectomy, colostomy, colon cancer.  Does have sinus tachycardia.  This a.m. we will order EKG and discussed situation with Surgical Service.     Jimeka Balan G. Renard Matter, MD     AGM/MEDQ  D:  10/03/2010  T:  10/03/2010  Job:  [id number]

## 2010-10-04 ENCOUNTER — Inpatient Hospital Stay (HOSPITAL_COMMUNITY): Payer: Medicaid Other

## 2010-10-04 LAB — CBC
HCT: 31.6 % — ABNORMAL LOW (ref 39.0–52.0)
Hemoglobin: 10.9 g/dL — ABNORMAL LOW (ref 13.0–17.0)
MCH: 33.6 pg (ref 26.0–34.0)
MCHC: 34.5 g/dL (ref 30.0–36.0)
MCV: 97.5 fL (ref 78.0–100.0)

## 2010-10-04 LAB — COMPREHENSIVE METABOLIC PANEL
BUN: 18 mg/dL (ref 6–23)
CO2: 25 mEq/L (ref 19–32)
Calcium: 8.8 mg/dL (ref 8.4–10.5)
GFR calc Af Amer: >60 mL/min (ref >60)
GFR calc non Af Amer: 52 mL/min — ABNORMAL LOW (ref >60)
Glucose, Bld: 87 mg/dL (ref 70–99)
Total Protein: 5.2 g/dL — ABNORMAL LOW (ref 6.0–8.3)

## 2010-10-04 MED ORDER — HYDROMORPHONE 0.3 MG/ML IV SOLN
INTRAVENOUS | Status: AC
  Filled 2010-10-04: qty 25

## 2010-10-04 NOTE — Progress Notes (Signed)
[name], [name] NO.:  [id number]  MEDICAL RECORD NO.:  [id number]  LOCATION:  A324                          FACILITY:  [facility]  PHYSICIAN:  Beyonca Wisz G. Renard Matter, MD   DATE OF BIRTH:  25-Jun-1950  DATE OF PROCEDURE: DATE OF DISCHARGE:                                PROGRESS NOTE   This patient had a CT-guided drainage of left psoas abscess.  He is running low fever since the procedure 2 days ago, but is fairly comfortable this morning, had been ambulating some in the room.  OBJECTIVE:  VITAL SIGNS:  Blood pressure 107/74, respirations 18, pulse 105, temperature 97. LUNGS:  Diminished breath sounds. HEART:  Regular rhythm. ABDOMEN:  Tenderness in upper abdomen.  The patient does have colostomy.  ASSESSMENT:  The patient continues to be treated for abscess as of psoas muscles in the abdomen.  Does have a history of hemicolectomy, colostomy, colon cancer.  Continue current IV antibiotics.     Windi Toro G. Renard Matter, MD     AGM/MEDQ  D:  10/04/2010  T:  10/04/2010  Job:  [id number]

## 2010-10-04 NOTE — Progress Notes (Signed)
Subjective: Feels good. Left-sided abdominal pain decreasing. Appetite much improved.  Objective: Vital signs in last 24 hours: Temp:  [97.7 F (36.5 C)-100.4 F (38 C)] 97.7 F (36.5 C) (08/15 0513) Pulse Rate:  [98-120] 98  (08/15 0903) Resp:  [16-19] 18  (08/15 0915) BP: (100-116)/(68-75) 107/74 mmHg (08/15 0513) SpO2:  [93 %-100 %] 100 % (08/15 0915) Weight:  [64.683 kg (142 lb 9.6 oz)] 142 lb 9.6 oz (64.683 kg) (08/14 1118) Last BM Date: 10/03/10  Intake/Output from previous day: 08/14 0701 - 08/15 0700 In: 3897.4 [P.O.:1020; I.V.:2727.4; IV Piggyback:150] Out: 3790 [Urine:650; Drains:40; Stool:3100] Intake/Output this shift:    General appearance: alert, cooperative and no distress Abdomen: Soft, flat. Ileostomy patent. Drain with cloudy white fluid. Significant decrease in tenderness to palpation of the left side of abdomen.  Lab Results:   [facility] 10/04/10 0510 10/03/10 0531  WBC 7.8 9.3  HGB 10.9* 11.0*  HCT 31.6* 32.5*  PLT 171 151   BMET  Basename 10/04/10 0510 10/03/10 0531  NA 134* 133*  K 3.8 4.0  CL 99 96  CO2 25 24  GLUCOSE 87 120*  BUN 18 17  CREATININE 1.41* 1.74*  CALCIUM 8.8 9.0   PT/INR  Basename 10/02/10 1150  LABPROT 16.5*  INR 1.31    Studies/Results: Ct Guided Abscess Drain  10/02/2010  *RADIOLOGY REPORT*  Indication: Recurrent left pelvic/retroperitoneal abscess.  CT GUIDED LEFT LOWER QUADRANT ABDOMINAL DRAIN PLACEMENT  Comparision: CT guided drain placement - 08/03/2010; CT of the abdomen and pelvis - 07/29/2010  Medications: Fentanyl 2 mcg IV; Versed 50 mg IV  Total Moderate Sedation time: 30 minutes  CT fluoroscopy time:  7 seconds  Contrast: None  Complications: None immediate  Technique / Findings:  Informed written consent was obtained from the patient's wife after a discussion of the risks, benefits and alternatives to treatment. The patient was placed supine on the CT gantry and a preprocedural CT was performed  re-demonstrating the known abscess/fluid collection within the left lower abdominal quadrant adjacent to the left psoas muscle.  The procedure was planned.   A timeout was performed prior to the iniation of the procedure.  The left lower abdominal quadrant prepped and draped in the usual sterile fashion.   The overlying soft tissues were anesthesized with 1% lidocaine.  An 18 guage, trocar needle was advanced in to the abscess/fluid collection under intermittent CT fluoroscopic guidance.  A short Amplatz super stiff wire was coiled within the abscess/fluid collection.   Appropriate positioning was confirmed with a limited CT scan.  The tract was serially dilated allowing placement of a 10 [place] all-purpose drainage catheter. Appropriate positioning was confirmed with a limited postprocedural CT scan.  18 ml of purulent fluid was aspirated.  The tube was connected to a drainage bulb and sutured in place.  A dressing was placed.  The patient tolerated the procedure well without immediate post procedural complication.  Impression:     1.    Successful CT guided placement of a 10 French all purpose       drain catheter into the left lower abdominal quadrant.        2.    Successful aspiration of 18 mL of purulent fluid.   Samples were sent to the laboratory for analysis. Original Report Authenticated By: Waynard Reeds, M.D.    Anti-infectives: Anti-infectives     Start     Dose/Rate Route Frequency Ordered Stop   09/29/10 1000   ertapenem [facility]) 1  g in sodium chloride 0.9 % 50 mL IVPB  Status:  Discontinued        1 g 100 mL/hr over 30 Minutes Intravenous Every 24 hours 09/28/10 0518 09/29/10 1255   09/28/10 0515   ertapenem (INVANZ) 1 g in sodium chloride 0.9 % 50 mL IVPB  Status:  Discontinued        1 g 100 mL/hr over 30 Minutes Intravenous Every 24 hours 09/28/10 0505 09/28/10 0518          Assessment/Plan: s/p CT-guided drainage of intra-abdominal abscess Culture results still pending,  though gram-positive cocci and gram-positive rods were seen. Plan for discharge tomorrow pending results of CT scan with rectal contrast today. Will adjust home-going antibiotics per culture results.  LOS: 7 days    Peter [name] A 10/04/2010

## 2010-10-05 ENCOUNTER — Other Ambulatory Visit (HOSPITAL_COMMUNITY): Payer: Self-pay | Admitting: General Surgery

## 2010-10-05 LAB — CULTURE, ROUTINE-ABSCESS

## 2010-10-05 LAB — COMPREHENSIVE METABOLIC PANEL
ALT: <5 U/L (ref 0–53)
Albumin: 1.8 g/dL — ABNORMAL LOW (ref 3.5–5.2)
Alkaline Phosphatase: 166 U/L — ABNORMAL HIGH (ref 39–117)
BUN: 18 mg/dL (ref 6–23)
Chloride: 99 mEq/L (ref 96–112)
Potassium: 3.7 mEq/L (ref 3.5–5.1)
Sodium: 135 mEq/L (ref 135–145)
Total Bilirubin: 0.4 mg/dL (ref 0.3–1.2)
Total Protein: 5.2 g/dL — ABNORMAL LOW (ref 6.0–8.3)

## 2010-10-05 LAB — CBC
HCT: 31.5 % — ABNORMAL LOW (ref 39.0–52.0)
Hemoglobin: 10.7 g/dL — ABNORMAL LOW (ref 13.0–17.0)
RDW: 14.5 % (ref 11.5–15.5)
WBC: 6.5 10*3/uL (ref 4.0–10.5)

## 2010-10-05 MED ORDER — PRO-STAT SUGAR FREE PO LIQD: 30.0000 mL | Freq: Three times a day (TID) | ORAL | Status: DC

## 2010-10-05 MED ORDER — POTASSIUM CHLORIDE CRYS ER 20 MEQ PO TBCR: 20.0000 meq | EXTENDED_RELEASE_TABLET | Freq: Two times a day (BID) | ORAL | Status: DC

## 2010-10-05 MED ORDER — SULFAMETHOXAZOLE-TRIMETHOPRIM 800-160 MG PO TABS: 1.0000 | ORAL_TABLET | Freq: Two times a day (BID) | ORAL | Status: AC

## 2010-10-05 NOTE — Discharge Summary (Signed)
Physician Discharge Summary  Patient ID: Peter [name] MRN: [id number] DOB/AGE: 08-19-1950 60 y.o.  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 *    Discharged Condition: Stable  Hospital Course: Patient is a 60 year old white male well known to our service with a history of an intra-abdominal abscess, renal insufficiency, dehydration. He has had an extensive hospital course this year. He had a right hemicolectomy for colon cancer, complicated with anastomotic leak requiring the formation of an ileostomy. He had an extensive stay in the intensive care unit due to renal failure. He has been at home over the past few months. He did have an intra-abdominal abscess which was drained percutaneously by CAT scan guidance. That drain stayed in for proximally 7 weeks. It was removed several weeks ago M.D. abscess cavity recurred. He was admitted for IV hydration, intravenous antibiotics, and treatment for the recurrent intra-abdominal abscess. He subsequently underwent CT-guided drainage of the intra-abdominal abscess the day rectal contrast CAT scan was done which revealed a fistula from the old anastomotic region to this abscess cavity. Final cultures did reveal Escherichia coli. His renal insufficiency has resolved. His leukocytosis has resolved.  The patient and family have been made aware of the results. He will ultimately need takedown of his ileostomy and resection of this area. This will be done once his abscess cavity has somewhat resolved.  Significant Diagnostic Studies: CT scan of the abdomen and pelvis  Treatments: CT guided drainage of intra-abdominal abscess  Discharge Exam: Blood pressure 109/72, pulse 98, temperature 97.7 F (36.5 C), temperature source Oral, resp. rate 18, height  5\' 9"  (1.753 m), weight 64.683 kg (142 lb 9.6 oz), SpO2 96.00%. General appearance: alert, cooperative and no distress Resp: clear to auscultation bilaterally Cardio: regular rate and rhythm, S1, S2 normal, no murmur, click, rub or gallop Abdomen: Soft, flat. Ileostomy pink and patent. Drain in left lower quadrant with white cloudy fluid.  Disposition: Home with home health nurse consultation  Current Discharge Medication List    START taking these medications   Details  sulfamethoxazole-trimethoprim (SEPTRA DS) 800-160 MG per tablet Take 1 tablet by mouth 2 (two) times daily. Qty: 28 tablet, Refills: 1      CONTINUE these medications which have CHANGED   Details  potassium chloride SA (KLOR-CON M20) 20 MEQ tablet Take 1 tablet (20 mEq total) by mouth 2 (two) times daily. Qty: 60 tablet, Refills: 2      CONTINUE these medications which have NOT CHANGED   Details  ALPRAZolam (XANAX) 0.25 MG tablet Take 0.25 mg by mouth at bedtime as needed. For anxiety    cholestyramine light (PREVALITE) 4 G packet Take 4 g by mouth 3 (three) times daily.      Ginger, Zingiber officinalis, (GINGER PO) Take 1 tablet by mouth daily. As needed for nausea     HYDROcodone-acetaminophen (NORCO) 5-325 MG per tablet Take 1 tablet by mouth every 6 (six) hours as needed. For pain    MILK THISTLE PO Take 240 mg by mouth daily as needed.     Multiple Vitamin (MULTIVITAMIN) tablet Take 1 tablet by mouth daily.      pantoprazole (PROTONIX) 40 MG tablet Take 40 mg by mouth 2 (two) times daily.     promethazine (PHENERGAN) 25 MG tablet Take 25 mg by mouth every 6 (six) hours as needed. For nausea  loperamide (IMODIUM) 2 MG capsule Take 2 mg by mouth 4 (four) times daily as needed.      megestrol ([name]) 40 MG tablet Take 40 mg by mouth daily.         Follow-up Information    Follow up with Margaretta Chittum A. Make an appointment on 10/19/2010.   Contact information:   [address] [place] [address] [phone]          Signed: Dalia Heading 10/05/2010, 8:23 AM

## 2010-10-06 ENCOUNTER — Encounter: Payer: Self-pay | Admitting: Internal Medicine

## 2010-10-06 NOTE — Telephone Encounter (Signed)
Pt has OV on 9/4 at 0930 with [facility]

## 2010-10-18 ENCOUNTER — Encounter: Payer: Self-pay | Admitting: Gastroenterology

## 2010-10-18 ENCOUNTER — Ambulatory Visit (INDEPENDENT_AMBULATORY_CARE_PROVIDER_SITE_OTHER): Payer: Medicaid Other | Admitting: Gastroenterology

## 2010-10-18 VITALS — BP 88/60 | HR 114 | Temp 97.9°F

## 2010-10-18 DIAGNOSIS — R634 Abnormal weight loss: Secondary | ICD-10-CM

## 2010-10-18 DIAGNOSIS — R197 Diarrhea, unspecified: Secondary | ICD-10-CM | POA: Insufficient documentation

## 2010-10-18 NOTE — Progress Notes (Signed)
Primary Care Physician: [name], MD  Primary Gastroenterologist: Roetta Sessions, MD     Chief Complaint  Patient presents with  . Diarrhea    HPI: Peter [name] is a 60 y.o. male here for further evaluation of ongoing diarrhea. A couple of weeks ago he was admitted with recurrent intraabdominal abscess. Hopefully getting drain out tomorrow. IV fluids at home. Wheelchair for weakness and exhaution. Weight 140 while in hospital some fluid. Weight 121.6 at home today. Eating well. Some burning behind ostomy. Changed bag. Empyting bag 10-12 times daily. Drink gatorade, within minutes it is seen in the bag. Within thirty minutes food in bag. Eating lots of meat. After meal, has to dump bag twice within the hour. Take cholestyramine OR immodium . After imodium, thin applesauce-like stool but then bag comes off. Questran two months at 4g tid. Imodium, two with each meal. Doesn't seem to be helping. Occasional mucous out of rectum.   Current Outpatient Prescriptions  Medication Sig Dispense Refill  . ALPRAZolam (XANAX) 0.25 MG tablet Take 0.25 mg by mouth at bedtime as needed. For anxiety      . cholestyramine light (PREVALITE) 4 G packet Take 4 g by mouth 3 (three) times daily.        . Ginger, Zingiber officinalis, (GINGER PO) Take 1 tablet by mouth daily. As needed for nausea       . HYDROcodone-acetaminophen (NORCO) 5-325 MG per tablet Take 1 tablet by mouth every 6 (six) hours as needed. For pain      . loperamide (IMODIUM) 2 MG capsule Take 2 mg by mouth 4 (four) times daily as needed.        Marland Kitchen MILK THISTLE PO Take 240 mg by mouth daily as needed.       . Multiple Vitamin (MULTIVITAMIN) tablet Take 1 tablet by mouth daily.        . pantoprazole (PROTONIX) 40 MG tablet Take 40 mg by mouth 2 (two) times daily.       . potassium chloride SA (KLOR-CON M20) 20 MEQ tablet Take 1 tablet (20 mEq total) by mouth 2 (two) times daily.  60 tablet  2  . promethazine (PHENERGAN) 25 MG tablet Take 25  mg by mouth every 6 (six) hours as needed. For nausea      . sulfamethoxazole-trimethoprim (BACTRIM DS,SEPTRA DS) 800-160 MG per tablet Take 1 tablet by mouth 2 (two) times daily.        . feeding supplement (PRO-STAT SUGAR FREE 64) LIQD Take 30 mLs by mouth 3 (three) times daily with meals.  900 mL  3  . megestrol ([name]) 40 MG tablet Take 40 mg by mouth daily.          Allergies as of 10/18/2010 - Review Complete 10/18/2010  Allergen Reaction Noted  . Ativan Other (See Comments) 08/01/2010  . Percocet (oxycodone-acetaminophen) Itching and Nausea Only 08/01/2010    ROS:  General: Negative for anorexia, fever, chills, fatigue. See HPI. ENT: Negative for hoarseness, difficulty swallowing , nasal congestion. CV: Negative for chest pain, angina, palpitations, dyspnea on exertion, peripheral edema.  Respiratory: Negative for dyspnea at rest, dyspnea on exertion, cough, sputum, wheezing.  GI: See history of present illness. GU:  Negative for dysuria, hematuria, urinary incontinence, urinary frequency, nocturnal urination.  Endo: Positive for unusual weight change.    Physical Examination:   BP 88/60  Pulse 114  Temp(Src) 97.9 F (36.6 C) (Temporal)  General: Thin, WM in no acute distress.  Eyes: No  icterus. Mouth: Oropharyngeal mucosa moist and pink , no lesions erythema or exudate. Lungs: Clear to auscultation bilaterally.  Heart: Regular rate and rhythm, no murmurs rubs or gallops. Recheck pulse, rate 100.  Abdomen: Bowel sounds are normal, nontender, nondistended, no hepatosplenomegaly or masses, no abdominal bruits or hernia , no rebound or guarding.  Ostomy in right abdomen with liquid stool. Extremities: No lower extremity edema. No clubbing or deformities. Neuro: Alert and oriented x 4   Skin: Warm and dry, no jaundice.   Psych: Alert and cooperative, normal mood and affect.  Labs:  Lab Results  Component Value Date   WBC 6.5 10/05/2010   HGB 10.7* 10/05/2010   HCT 31.5*  10/05/2010   MCV 96.6 10/05/2010   PLT 205 10/05/2010   Lab Results  Component Value Date   CREATININE 1.44* 10/05/2010   BUN 18 10/05/2010   NA 135 10/05/2010   K 3.7 10/05/2010   CL 99 10/05/2010   CO2 25 10/05/2010   Lab Results  Component Value Date   ALT <5 10/05/2010   AST 14 10/05/2010   ALKPHOS 166* 10/05/2010   BILITOT 0.4 10/05/2010   No results found for this basename: TSH    Imaging Studies: Ct Abdomen Pelvis Wo Contrast  10/04/2010  *RADIOLOGY REPORT*  Clinical Data: Colon resection, diverting ileostomy right lower quadrant, isolated rectosigmoid stump, recurrent left iliopsoas abscess, question colonic leak  CT ABDOMEN AND PELVIS WITHOUT CONTRAST  Technique:  Multidetector CT imaging of the abdomen and pelvis was performed following the standard protocol without intravenous contrast. Examination performed before and after retrograde instillation of water-soluble rectal contrast.  Oral contrast not administered.  Sagittal and coronal MPR images reconstructed from axial data set.  Comparison: 09/28/2010  Findings: Bibasilar pleural effusions and atelectasis greater on the right. Small pericardial effusion. Moderate ascites in upper abdomen. Contracted gallbladder. No focal abnormalities identified within the liver, spleen, pancreas, kidneys, or adrenal glands on exam limited by lack of IV contrast. Small bowel loops in abdomen show questionable wall thickening versus underdistension artifact and right upper quadrant. Ileostomy right lower quadrant.  Sub total colectomy with long rectosigmoid remnant with staple line in right lower quadrant at tip, images 64 - 73. Percutaneous pigtail drainage catheter at site of previously identified left iliopsoas abscess. Following instillation of rectal contrast, contrast extravasation is identified from the rectosigmoid stump and tracks in a linear fashion laterally to the previously seen left iliopsoas collection, pooling around the pigtail drainage  catheter. Findings compatible with leak at staple line at the stump of the retained colon. No free intraperitoneal spillage of contrast identified. No definite free intraperitoneal air.  Retained colon shows no definite wall thickening. Unremarkable bladder. No hernia or acute bony lesion.  IMPRESSION: Extravasation of rectal contrast from the stump of the isolated rectosigmoid colon compatible with leak at staple line, with contrast extending from the tip of the retained colon to the left iliopsoas abscess collection and pigtail drainage catheter. Significant ascites, bilateral pleural effusions and small pericardial effusion. No free intraperitoneal spillage of contrast identified.  Findings called to Dr. Lovell Sheehan prior to dictation of this report on 10/04/2010 at 1145 hours.  Original Report Authenticated By: Lollie Marrow, M.D.   Ct Abdomen Pelvis Wo Contrast  09/28/2010  *RADIOLOGY REPORT*  Clinical Data: Abdominal pain, status post right hemicolectomy with colostomy for colon cancer, history of abscess  CT ABDOMEN AND PELVIS WITHOUT CONTRAST  Technique:  Multidetector CT imaging of the abdomen and pelvis was performed  following the standard protocol without intravenous contrast.  Comparison: 08/01/2010  Findings: Lung bases are essentially clear.  Unenhanced liver, spleen the pancreas, and adrenal glands are within normal limits.  Gallbladder is unremarkable.  No intrahepatic or extrahepatic ductal dilatation.  Kidneys are unremarkable.  No renal calculi or hydronephrosis.  Status post right hemicolectomy with right lower quadrant colostomy. No evidence of bowel obstruction.  Moderate abdominopelvic ascites.  Recurrent 3.1 x 2.6 cm fluid collection just lateral to the left psoas (series 2/image 57). Residual 2.2 x 2.2 cm fluid collection lateral to the right psoas.  1.6 x 2.0 cm lesion in the anterior lower abdomen (series 2/image 63), measuring higher than simple fluid density, previously 0.9 x 1.1 cm.   Scattered small mesenteric and retroperitoneal lymph nodes which do not meet pathologic CT size criteria.  No evidence of abdominal aortic aneurysm.  Prostate is unremarkable.  Bladder is thick-walled but underdistended.  Mild degenerative changes of the visualized thoracolumbar spine. No focal osseous lesions.  IMPRESSION: Status post right hemicolectomy with right lower quadrant colostomy for colon cancer.  No evidence of bowel obstruction.  Recurrent 3.1 cm fluid collection lateral to the left psoas. Residual 2.2 cm fluid collection lateral to the right psoas. Moderate abdominopelvic ascites.  2.0 x 1.6 cm lesion in the midline lower abdomen, incompletely characterized in the absence of contrast, increased. Recurrent/metastatic disease is not excluded.  Original Report Authenticated By: Charline Bills, M.D.

## 2010-10-19 ENCOUNTER — Encounter: Payer: Self-pay | Admitting: Gastroenterology

## 2010-10-19 DIAGNOSIS — R634 Abnormal weight loss: Secondary | ICD-10-CM | POA: Insufficient documentation

## 2010-10-19 LAB — CLOSTRIDIUM DIFFICILE BY PCR: Toxigenic C. Difficile by PCR: NOT DETECTED

## 2010-10-19 LAB — GIARDIA/CRYPTOSPORIDIUM (EIA)
Cryptosporidium Screen (EIA): NEGATIVE
Giardia Screen (EIA): NEGATIVE

## 2010-10-19 MED ORDER — DIPHENOXYLATE-ATROPINE 2.5-0.025 MG PO TABS: 1.0000 | ORAL_TABLET | Freq: Four times a day (QID) | ORAL | Status: DC | PRN

## 2010-10-19 NOTE — Assessment & Plan Note (Signed)
Weight down from 176 to 121 since 12/11. CRC s/p surgery with multiple complications. To discuss with Dr. Jena Gauss. Consider rechecking prealbumin and TSH. Further recommendations to follow. Patient is eating adequately but has rapid transit into his ostomy bag.

## 2010-10-19 NOTE — Progress Notes (Signed)
Addended by: Tiffany Kocher on: 10/19/2010 03:55 PM   Modules accepted: Orders

## 2010-10-19 NOTE — Assessment & Plan Note (Addendum)
Chronic diarrhea s/p ileostomy. Residual sigmoid colon/rectum. No significant rectal output. Stool studies. Patient not responding to Questran or imodium. To discuss with Dr. Jena Gauss.   Please note, patient has been receiving IV fluids at home. HR and BP stable. Has appointment with Dr. Lovell Sheehan tomorrow.   Discussed with Dr. Jena Gauss. Patient may have element of short gut syndrome since SB resection along with partial colectomy. Should improve with ileostomy reversed. Continue protonix to keep acid down. Make sure MVI includes fat-soluble vitamins A,D,E,K.Try Lomotil one po qid. F/U stool studies.

## 2010-10-19 NOTE — Progress Notes (Signed)
Quick Note:  Await pending stools. See Addendum to 10/18/10 OV note. ______

## 2010-10-24 ENCOUNTER — Encounter (HOSPITAL_COMMUNITY): Payer: Self-pay | Admitting: Oncology

## 2010-10-24 ENCOUNTER — Ambulatory Visit: Payer: Medicaid Other | Admitting: Gastroenterology

## 2010-10-24 ENCOUNTER — Encounter (HOSPITAL_COMMUNITY): Payer: Medicaid Other | Attending: Oncology | Admitting: Oncology

## 2010-10-24 DIAGNOSIS — C189 Malignant neoplasm of colon, unspecified: Secondary | ICD-10-CM

## 2010-10-24 DIAGNOSIS — N289 Disorder of kidney and ureter, unspecified: Secondary | ICD-10-CM

## 2010-10-24 DIAGNOSIS — D638 Anemia in other chronic diseases classified elsewhere: Secondary | ICD-10-CM

## 2010-10-24 DIAGNOSIS — K7460 Unspecified cirrhosis of liver: Secondary | ICD-10-CM

## 2010-10-24 HISTORY — DX: Disorder of kidney and ureter, unspecified: N28.9

## 2010-10-24 MED ORDER — EPOETIN ALFA 40000 UNIT/ML IJ SOLN
INTRAMUSCULAR | Status: AC
  Filled 2010-10-24: qty 1

## 2010-10-24 MED ORDER — EPOETIN ALFA 40000 UNIT/ML IJ SOLN
40000.0000 [IU] | Freq: Once | INTRAMUSCULAR | Status: AC
  Administered 2010-10-24: 40000 [IU] via SUBCUTANEOUS

## 2010-10-24 NOTE — Progress Notes (Signed)
Cc to PCP 

## 2010-10-24 NOTE — Patient Instructions (Signed)
[facility]  Discharge Instructions  RECOMMENDATIONS MADE BY THE CONSULTANT AND ANY TEST RESULTS WILL BE SENT TO YOUR REFERRING DOCTOR.   EXAM FINDINGS BY MD TODAY AND SIGNS AND SYMPTOMS TO REPORT TO CLINIC OR PRIMARY MD: Will restart Procrit injections, will get Home Health to draw CEA levels while they are seeing you.  MEDICATIONS PRESCRIBED: Procrit injections   INSTRUCTIONS GIVEN AND DISCUSSED: Other :  Report shortness of breath or increased fatigue.  SPECIAL INSTRUCTIONS/FOLLOW-UP: Return to Clinic on as scheduled.   I acknowledge that I have been informed and understand all the instructions given to me and received a copy. I do not have any more questions at this time, but understand that I may call the Specialty [facility] at [facility] at [phone] during business hours should I have any further questions or need assistance in obtaining follow-up care.    __________________________________________  _____________  __________ Signature of Patient or Authorized Representative            Date                   Time    __________________________________________ Nurse's Signature

## 2010-10-24 NOTE — Progress Notes (Signed)
[name], MD [address] [place] [address]  1. Colon cancer   2. Anemia of chronic disease   3. CIRRHOSIS     CURRENT THERAPY:S/P surgical resection Jan 2012  INTERVAL HISTORY: ORBIN MAYEUX 60 y.o. male returns for  regular  visit for followup of Stage I tubulovillous adenoma colon cancer and anemia of chronic disease.  The patient reports that he has been seeing Dr. Jena Gauss for his "watery" stools.   The patient did have an appointment with Dr. Emelia Loron for his renal insufficiency but unfortunately he had to cancel his appointment due to sickness.   The patient has been in and out of the hospital due to a intra-abdominal abscess.  According to Dr. Lovell Sheehan, "He will ultimately need takedown of his ileostomy and resection of this area. This will be done once his abscess cavity has somewhat resolved."  The patient is aware of this.  The patient is S/P IV antibiotics per Dr. Lovell Sheehan for his abscess.  The patient was on Procrit for his anemia of chronic disease, but this has been placed on hold due to a great response in his Hgb.  Oncologically, the patient has no complaints.  He is seen in a wheelchair today.  He is ambulating at home but he reports that he is unsteady on his feet.  He utilizes a walking stick and a rolling walker at home for balance stabilization.   Past Medical History  Diagnosis Date  . Gout   . GERD (gastroesophageal reflux disease)   . Cirrhosis     ?ETOH related  . Colon cancer Dec 2011    Cecum  . ARF (acute renal failure)     hospitalization Jan 2012  . H/O ETOH abuse   . 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 S/P endoscopy Dec 2011    Schatzki's ring, Grade 1 esophageal varices, antral/body erosions  . Anemia of chronic disease 08/10/2010  . Anemia     has CIRRHOSIS; EPIGASTRIC PAIN; TRANSAMINASES, SERUM, ELEVATED; Personal history of colon cancer; Dehydration; Colon cancer; Anemia of  chronic disease; Diarrhea; and Weight loss, abnormal on his problem list.     is allergic to ativan and percocet.  Mr. Mondry had no medications administered during this visit.  Past Surgical History  Procedure Date  . Colon surgery 02/24/2010    colon cancer (cecum)  . Exploratory laparotomy 03/03/2010    anastomotic leak, developed EC fistula  . Exploratory laparotomy w/ bowel resection 04/18/2010    ileostomy placed, (hx of EC fistula, anastomotic leak). About two feet of ileum removed.  . Colostomy   . Percutaneous drainage of intraabdominal abscess 07/2010 and 09/2010    Denies any headaches, dizziness, double vision, fevers, chills, night sweats, nausea, vomiting, constipation, chest pain, heart palpitations, shortness of breath, blood in stool, black tarry stool, urinary pain, urinary burning, urinary frequency, hematuria.   PHYSICAL EXAMINATION  ECOG PERFORMANCE STATUS: 2 - Symptomatic, <50% confined to bed  Filed Vitals:   10/24/10 1036  BP: 91/63  Pulse: 112  Temp: 97.8 F (36.6 C)    GENERAL:alert, no distress, well nourished, well developed, comfortable, cooperative and smiling SKIN: skin color, texture, turgor are normal HEAD: Normocephalic EYES: normal EARS: External ears normal OROPHARYNX:mucous membranes are moist  NECK: trachea midline LYMPH:  No epitrochlear nodes noted BREAST:breasts appear normal, no suspicious masses, no skin or nipple changes or axillary nodes, pectis escavatum LUNGS: clear to auscultation  and percussion HEART: regular rate & rhythm, no murmurs, no gallops, S1 normal and S2 normal ABDOMEN:abdomen soft, non-tender, normal bowel sounds and colostomy bag producing watery stool BACK: Back symmetric, no curvature. EXTREMITIES:less then 2 second capillary refill, no joint deformities, effusion, or inflammation, no skin discoloration, no clubbing, no cyanosis, positive findings:  edema trace B/L LE edema  NEURO: alert & oriented x 3 with  fluent speech, no focal motor/sensory deficits   LABORATORY DATA: Lab work per Home Health    RADIOGRAPHIC STUDIES:  10/04/10  *RADIOLOGY REPORT*  Clinical Data: Colon resection, diverting ileostomy right lower  quadrant, isolated rectosigmoid stump, recurrent left iliopsoas  abscess, question colonic leak  CT ABDOMEN AND PELVIS WITHOUT CONTRAST  Technique: Multidetector CT imaging of the abdomen and pelvis was  performed following the standard protocol without intravenous  contrast. Examination performed before and after retrograde  instillation of water-soluble rectal contrast. Oral contrast not  administered. Sagittal and coronal MPR images reconstructed from  axial data set.  Comparison: 09/28/2010  Findings:  Bibasilar pleural effusions and atelectasis greater on the right.  Small pericardial effusion.  Moderate ascites in upper abdomen.  Contracted gallbladder.  No focal abnormalities identified within the liver, spleen,  pancreas, kidneys, or adrenal glands on exam limited by lack of IV  contrast.  Small bowel loops in abdomen show questionable wall thickening  versus underdistension artifact and right upper quadrant.  Ileostomy right lower quadrant.  Sub total colectomy with long rectosigmoid remnant with staple line  in right lower quadrant at tip, images 64 - 73.  Percutaneous pigtail drainage catheter at site of previously  identified left iliopsoas abscess.  Following instillation of rectal contrast, contrast extravasation  is identified from the rectosigmoid stump and tracks in a linear  fashion laterally to the previously seen left iliopsoas collection,  pooling around the pigtail drainage catheter.  Findings compatible with leak at staple line at the stump of the  retained colon.  No free intraperitoneal spillage of contrast identified.  No definite free intraperitoneal air.  Retained colon shows no definite wall thickening.  Unremarkable bladder.  No  hernia or acute bony lesion.  IMPRESSION:  Extravasation of rectal contrast from the stump of the isolated  rectosigmoid colon compatible with leak at staple line, with  contrast extending from the tip of the retained colon to the left  iliopsoas abscess collection and pigtail drainage catheter.  Significant ascites, bilateral pleural effusions and small  pericardial effusion.  No free intraperitoneal spillage of contrast identified.  Findings called to Dr. Lovell Sheehan prior to dictation of this report on  10/04/2010 at 1145 hours.  Original Report Authenticated By: Lollie Marrow, M.D.   PATHOLOGY: 1. Colon, segmental resection for tumor- invasive adenocarcinoma arising out of a tubulovillous adenoma (cecal lesion), separate ulcerated area in the splenic flexure with associated tattoo ink, 0/18 nodes for disease, negative surgical margins.    ASSESSMENT:  1. Stage I Colon cancer, S/P resection in Jan 2012 2. Anemia of chronic disease 3. Renal insufficiency   PLAN:  1. Procrit 40,000 units monthly 2. Follow-up with nephrologist 3. Return in 3 months 4. Lab work monthly: CBC 5. Lab work every three months: BMET, CEA 6. I personally reviewed and went over radiographic studies with the patient. 7. I personally reviewed and went over laboratory results with the patient. 8. Lab work today: CEA   All questions were answered. The patient knows to call the clinic with any problems, questions or concerns. We can  certainly see the patient much sooner if necessary.  The patient and plan discussed with Glenford Peers, MD and he is in agreement with the aforementioned.    Jahnaya Branscome

## 2010-10-24 NOTE — Progress Notes (Signed)
Peter [name] presents today for injection per MD orders. Procrit 40,000 units administered SQ in left Abdomen. Administration without incident. Patient tolerated well.

## 2010-10-25 LAB — CEA: CEA: 1.2 ng/mL (ref 0.0–5.0)

## 2010-10-27 ENCOUNTER — Encounter (HOSPITAL_COMMUNITY): Payer: Self-pay

## 2010-10-27 ENCOUNTER — Encounter (HOSPITAL_COMMUNITY)
Admission: RE | Admit: 2010-10-27 | Discharge: 2010-10-27 | Disposition: A | Discharge status: Home / Self Care | Payer: Medicaid Other | Source: Ambulatory Visit | Attending: General Surgery | Admitting: General Surgery

## 2010-10-27 LAB — DIFFERENTIAL
Basophils Absolute: 0 10*3/uL (ref 0.0–0.1)
Eosinophils Relative: 3 % (ref 0–5)
Lymphs Abs: 1.8 10*3/uL (ref 0.7–4.0)
Monocytes Absolute: 0.4 10*3/uL (ref 0.1–1.0)
Monocytes Relative: 7 % (ref 3–12)
Neutro Abs: 3.3 10*3/uL (ref 1.7–7.7)
Neutrophils Relative %: 59 % (ref 43–77)

## 2010-10-27 LAB — BASIC METABOLIC PANEL
Calcium: 7.6 mg/dL — ABNORMAL LOW (ref 8.4–10.5)
GFR calc Af Amer: 46 mL/min — ABNORMAL LOW (ref >60)
GFR calc non Af Amer: 38 mL/min — ABNORMAL LOW (ref >60)
Glucose, Bld: 85 mg/dL (ref 70–99)
Potassium: 3.3 mEq/L — ABNORMAL LOW (ref 3.5–5.1)
Sodium: 142 mEq/L (ref 135–145)

## 2010-10-27 LAB — CBC
MCH: 32.4 pg (ref 26.0–34.0)
MCHC: 34.6 g/dL (ref 30.0–36.0)
Platelets: 128 10*3/uL — ABNORMAL LOW (ref 150–400)
RBC: 3.15 MIL/uL — ABNORMAL LOW (ref 4.22–5.81)

## 2010-10-27 NOTE — Patient Instructions (Signed)
20 Peter [name]  10/27/2010   Your procedure is scheduled on:  10/30/2010  Report to [name] at 09;15 AM.  Call this number if you have problems the morning of surgery: [phone]   Remember:   Do not eat food:After Midnight.  Do not drink clear liquids: After Midnight.  Take these medicines the morning of surgery with A SIP OF WATER: xanax and protonix   Do not wear jewelry, make-up or nail polish.  Do not wear lotions, powders, or perfumes. You may wear deodorant.  Do not shave 48 hours prior to surgery.  Do not bring valuables to the hospital.  Contacts, dentures or bridgework may not be worn into surgery.  Leave suitcase in the car. After surgery it may be brought to your room.  For patients admitted to the hospital, checkout time is 11:00 AM the day of discharge.   Patients discharged the day of surgery will not be allowed to drive home.  Name and phone number of your driver:   Special Instructions: CHG Shower Shower 2 days before surgery and 1 day before surgery with Hibiclens.   Please read over the following fact sheets that you were given: Pain Booklet

## 2010-10-27 NOTE — H&P (Signed)
Peter [name] is an 60 y.o. male.   Chief Complaint: Status post ileostomy placement for anastomotic leak HPI: Patient is a 60 year old white male who underwent a right hemicolectomy in January 2012 for a T1, N0, M0 adenocarcinoma of the right colon who had a prolonged course in the hospital do to anastomotic leak, ventilatory dependent respiratory failure, and renal failure. During that time, an ileostomy had been placed. He has had significant episodes of dehydration secondary to high ostomy output. This has been treated at home with IV saline boluses. His renal function has improved significantly, and he now presents for ileostomy takedown. In addition, he underwent laboratory partial colectomy to remove that portion of the bowel with the anastomotic leak was. This continues to leak fluid despite being diverted.  Past Medical History  Diagnosis Date  . Gout   . GERD (gastroesophageal reflux disease)   . Cirrhosis     ?ETOH related  . Colon cancer Dec 2011    Cecum  . ARF (acute renal failure)     hospitalization Jan 2012  . H/O ETOH abuse   . 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 S/P endoscopy Dec 2011    Schatzki's ring, Grade 1 esophageal varices, antral/body erosions  . Anemia of chronic disease 08/10/2010  . Anemia   . Renal insufficiency 10/24/2010  . Renal insufficiency 10/24/2010    Past Surgical History  Procedure Date  . Colon surgery 02/24/2010    colon cancer (cecum)  . Exploratory laparotomy 03/03/2010    anastomotic leak, developed EC fistula  . Exploratory laparotomy w/ bowel resection 04/18/2010    ileostomy placed, (hx of EC fistula, anastomotic leak). About two feet of ileum removed.  . Colostomy   . Percutaneous drainage of intraabdominal abscess 07/2010 and 09/2010    Family History  Problem Relation Age of Onset  . Cirrhosis Father     deceased, secondary to ETOH   Social History:  reports that he has never smoked.  He does not have any smokeless tobacco history on file. He reports that he does not drink alcohol or use illicit drugs.  Allergies:  Allergies  Allergen Reactions  . Ativan Other (See Comments)    Causes Hallucinations, makes "crazy"  . Percocet (Oxycodone-Acetaminophen) Itching and Nausea Only    No current facility-administered medications on file as of .   Medications Prior to Admission  Medication Sig Dispense Refill  . ALPRAZolam (XANAX) 0.25 MG tablet Take 0.25 mg by mouth at bedtime as needed. For anxiety      . cholestyramine light (PREVALITE) 4 G packet Take 4 g by mouth 3 (three) times daily.        . diphenoxylate-atropine (LOMOTIL) 2.5-0.025 MG per tablet Take 1 tablet by mouth 4 (four) times daily as needed for diarrhea/loose stools.  120 tablet  1  . feeding supplement (PRO-STAT SUGAR FREE 64) LIQD Take 30 mLs by mouth as needed.        . Ginger, Zingiber officinalis, (GINGER PO) Take 1 tablet by mouth daily. As needed for nausea       . HYDROcodone-acetaminophen (NORCO) 5-325 MG per tablet Take 1 tablet by mouth every 6 (six) hours as needed. For pain      . Invert Sugar-Sodium Chloride (TRAVERT/NORMAL SALINE IV) Inject into the vein 2 (two) times daily. GETTING IV FLUIDS 1000CC 1 X DAILY 8/27 - 8/30 THEN WENT TO 2 X DAILY       .  loperamide (IMODIUM) 2 MG capsule Take 2 mg by mouth 4 (four) times daily as needed.        . megestrol ([name]) 40 MG tablet Take 40 mg by mouth daily.        Marland Kitchen MILK THISTLE PO Take 240 mg by mouth daily as needed.       . Multiple Vitamin (MULTIVITAMIN) tablet Take 1 tablet by mouth daily.        . pantoprazole (PROTONIX) 40 MG tablet Take 40 mg by mouth 2 (two) times daily.       . potassium chloride SA (KLOR-CON M20) 20 MEQ tablet Take 1 tablet (20 mEq total) by mouth 2 (two) times daily.  60 tablet  2  . promethazine (PHENERGAN) 25 MG tablet Take 25 mg by mouth every 6 (six) hours as needed. For nausea      . sulfamethoxazole-trimethoprim  (BACTRIM DS,SEPTRA DS) 800-160 MG per tablet Take 1 tablet by mouth 2 (two) times daily.          No results found for this or any previous visit (from the past 48 hour(s)). No results found.  Review of Systems  Constitutional: Positive for weight loss and malaise/fatigue.  HENT: Negative.   Eyes: Negative.   Respiratory: Negative.   Cardiovascular: Negative.   Gastrointestinal: Positive for diarrhea.  Genitourinary: Negative.   Musculoskeletal: Negative.   Skin: Negative.   Neurological: Negative.   Endo/Heme/Allergies: Negative.   Psychiatric/Behavioral: Negative.     There were no vitals taken for this visit. Physical Exam  Constitutional: He is oriented to person, place, and time. He appears well-developed.  HENT:  Head: Normocephalic and atraumatic.  Eyes: Pupils are equal, round, and reactive to light.  Neck: Normal range of motion. Neck supple.  Cardiovascular: Normal rate, regular rhythm and normal heart sounds.   Respiratory: Effort normal and breath sounds normal.  GI: Soft. Bowel sounds are normal.       Ileostomy in place, right side of abdomen.  Musculoskeletal: Normal range of motion.  Neurological: He is alert and oriented to person, place, and time.  Skin: Skin is warm and dry.  Psychiatric: He has a normal mood and affect. His behavior is normal. Judgment and thought content normal.     Assessment/Plan Status post ileostomy, history of colon cancer, history of renal insufficiency  Plan: Patient is to undergo a partial colectomy with takedown of ileostomy on 10/30/2010. The risks and benefits of the procedure including bleeding, infection, cardiopulmonary difficulties, and the possible blood transfusion were fully explained to the patient, gave informed consent.  Peter [name] A 10/27/2010, 1:23 PM

## 2010-10-27 NOTE — Pre-Procedure Instructions (Signed)
Viewed Video [phone]

## 2010-10-30 ENCOUNTER — Encounter (HOSPITAL_COMMUNITY): Payer: Self-pay | Admitting: *Deleted

## 2010-10-30 ENCOUNTER — Inpatient Hospital Stay (HOSPITAL_COMMUNITY)
Admission: RE | Admit: 2010-10-30 | Discharge: 2010-11-07 | DRG: 331 | Disposition: A | Discharge status: Home Health | Payer: Medicaid Other | Source: Ambulatory Visit | Attending: General Surgery | Admitting: General Surgery

## 2010-10-30 ENCOUNTER — Encounter (HOSPITAL_COMMUNITY)
Admission: RE | Disposition: A | Discharge status: Home Health | Payer: Self-pay | Source: Ambulatory Visit | Attending: General Surgery

## 2010-10-30 ENCOUNTER — Encounter (HOSPITAL_COMMUNITY): Payer: Self-pay | Admitting: Anesthesiology

## 2010-10-30 ENCOUNTER — Inpatient Hospital Stay (HOSPITAL_COMMUNITY): Payer: Medicaid Other | Admitting: Anesthesiology

## 2010-10-30 ENCOUNTER — Other Ambulatory Visit: Payer: Self-pay | Admitting: General Surgery

## 2010-10-30 DIAGNOSIS — K7460 Unspecified cirrhosis of liver: Secondary | ICD-10-CM

## 2010-10-30 DIAGNOSIS — Z85038 Personal history of other malignant neoplasm of large intestine: Secondary | ICD-10-CM

## 2010-10-30 DIAGNOSIS — N289 Disorder of kidney and ureter, unspecified: Secondary | ICD-10-CM | POA: Diagnosis present

## 2010-10-30 DIAGNOSIS — E876 Hypokalemia: Secondary | ICD-10-CM | POA: Diagnosis present

## 2010-10-30 DIAGNOSIS — D631 Anemia in chronic kidney disease: Secondary | ICD-10-CM | POA: Diagnosis present

## 2010-10-30 DIAGNOSIS — D696 Thrombocytopenia, unspecified: Secondary | ICD-10-CM | POA: Diagnosis not present

## 2010-10-30 DIAGNOSIS — Z01812 Encounter for preprocedural laboratory examination: Secondary | ICD-10-CM

## 2010-10-30 DIAGNOSIS — Z433 Encounter for attention to colostomy: Principal | ICD-10-CM

## 2010-10-30 HISTORY — PX: PARTIAL COLECTOMY: SHX5273

## 2010-10-30 HISTORY — PX: ILEOSTOMY CLOSURE: SHX1784

## 2010-10-30 LAB — POCT I-STAT 4, (NA,K, GLUC, HGB,HCT): Glucose, Bld: 106 mg/dL — ABNORMAL HIGH (ref 70–99)

## 2010-10-30 SURGERY — COLECTOMY, PARTIAL
Anesthesia: General | Site: Abdomen | Wound class: Dirty or Infected

## 2010-10-30 MED ORDER — ONDANSETRON HCL 4 MG/2ML IJ SOLN
4.0000 mg | Freq: Once | INTRAMUSCULAR | Status: AC
  Administered 2010-10-30: 4 mg via INTRAVENOUS

## 2010-10-30 MED ORDER — NEOSTIGMINE METHYLSULFATE 1 MG/ML IJ SOLN
INTRAMUSCULAR | Status: DC | PRN
  Administered 2010-10-30: 3 mg via INTRAMUSCULAR

## 2010-10-30 MED ORDER — ONDANSETRON HCL 4 MG/2ML IJ SOLN
4.0000 mg | Freq: Four times a day (QID) | INTRAMUSCULAR | Status: DC | PRN
  Administered 2010-11-03 (×3): 4 mg via INTRAVENOUS
  Filled 2010-10-30 (×3): qty 2

## 2010-10-30 MED ORDER — ENOXAPARIN SODIUM 30 MG/0.3ML ~~LOC~~ SOLN
SUBCUTANEOUS | Status: AC
  Administered 2010-10-30: 30 mg via SUBCUTANEOUS
  Filled 2010-10-30: qty 0.3

## 2010-10-30 MED ORDER — LACTATED RINGERS IV SOLN
INTRAVENOUS | Status: DC
  Administered 2010-10-30 – 2010-11-01 (×4): via INTRAVENOUS

## 2010-10-30 MED ORDER — GLYCOPYRROLATE 0.2 MG/ML IJ SOLN
INTRAMUSCULAR | Status: AC
  Filled 2010-10-30: qty 2

## 2010-10-30 MED ORDER — FENTANYL CITRATE 0.05 MG/ML IJ SOLN
25.0000 ug | INTRAMUSCULAR | Status: DC | PRN
  Administered 2010-10-30: 50 ug via INTRAVENOUS

## 2010-10-30 MED ORDER — ONDANSETRON HCL 4 MG/2ML IJ SOLN
4.0000 mg | Freq: Once | INTRAMUSCULAR | Status: DC | PRN
  Administered 2010-10-30: 4 mg via INTRAVENOUS

## 2010-10-30 MED ORDER — ENOXAPARIN SODIUM 30 MG/0.3ML ~~LOC~~ SOLN
30.0000 mg | SUBCUTANEOUS | Status: DC
  Administered 2010-10-30 – 2010-11-01 (×3): 30 mg via SUBCUTANEOUS
  Filled 2010-10-30 (×3): qty 0.3

## 2010-10-30 MED ORDER — LIDOCAINE HCL (PF) 1 % IJ SOLN
INTRAMUSCULAR | Status: AC
  Filled 2010-10-30: qty 5

## 2010-10-30 MED ORDER — ROCURONIUM BROMIDE 50 MG/5ML IV SOLN
INTRAVENOUS | Status: AC
  Filled 2010-10-30: qty 1

## 2010-10-30 MED ORDER — SODIUM CHLORIDE 0.9 % IJ SOLN
INTRAMUSCULAR | Status: AC
  Administered 2010-10-30: 10 mL
  Filled 2010-10-30: qty 20

## 2010-10-30 MED ORDER — FENTANYL CITRATE 0.05 MG/ML IJ SOLN
INTRAMUSCULAR | Status: AC
  Administered 2010-10-30: 50 ug via INTRAVENOUS
  Filled 2010-10-30: qty 5

## 2010-10-30 MED ORDER — MIDAZOLAM HCL 2 MG/2ML IJ SOLN
INTRAMUSCULAR | Status: AC
  Administered 2010-10-30: 2 mg via INTRAVENOUS
  Filled 2010-10-30: qty 2

## 2010-10-30 MED ORDER — MIDAZOLAM HCL 2 MG/2ML IJ SOLN
1.0000 mg | INTRAMUSCULAR | Status: DC | PRN
  Administered 2010-10-30 (×2): 2 mg via INTRAVENOUS

## 2010-10-30 MED ORDER — PROPOFOL 10 MG/ML IV EMUL
INTRAVENOUS | Status: DC | PRN
  Administered 2010-10-30: 130 mg via INTRAVENOUS

## 2010-10-30 MED ORDER — SODIUM CHLORIDE 0.9 % IV SOLN
1.0000 g | INTRAVENOUS | Status: DC
  Administered 2010-10-30 – 2010-11-04 (×6): 1 g via INTRAVENOUS
  Filled 2010-10-30 (×7): qty 1

## 2010-10-30 MED ORDER — GLYCOPYRROLATE 0.2 MG/ML IJ SOLN
INTRAMUSCULAR | Status: DC | PRN
  Administered 2010-10-30: .6 mg via INTRAVENOUS

## 2010-10-30 MED ORDER — LACTATED RINGERS IV SOLN
INTRAVENOUS | Status: DC | PRN
  Administered 2010-10-30 (×2): via INTRAVENOUS

## 2010-10-30 MED ORDER — LIDOCAINE HCL 1 % IJ SOLN
INTRAMUSCULAR | Status: DC | PRN
  Administered 2010-10-30: 40 mg via INTRADERMAL

## 2010-10-30 MED ORDER — ROCURONIUM BROMIDE 100 MG/10ML IV SOLN
INTRAVENOUS | Status: DC | PRN
  Administered 2010-10-30: 10 mg via INTRAVENOUS
  Administered 2010-10-30: 5 mg via INTRAVENOUS
  Administered 2010-10-30: 10 mg via INTRAVENOUS
  Administered 2010-10-30: 25 mg via INTRAVENOUS
  Administered 2010-10-30 (×2): 10 mg via INTRAVENOUS

## 2010-10-30 MED ORDER — SUCCINYLCHOLINE CHLORIDE 20 MG/ML IJ SOLN
INTRAMUSCULAR | Status: DC | PRN
  Administered 2010-10-30: 120 mg via INTRAVENOUS

## 2010-10-30 MED ORDER — ACETAMINOPHEN 10 MG/ML IV SOLN: 1000.0000 mg | Freq: Four times a day (QID) | INTRAVENOUS | Status: DC

## 2010-10-30 MED ORDER — SODIUM CHLORIDE 0.9 % IV SOLN
INTRAVENOUS | Status: AC
  Filled 2010-10-30: qty 1

## 2010-10-30 MED ORDER — SUFENTANIL CITRATE 50 MCG/ML IV SOLN
INTRAVENOUS | Status: AC
  Filled 2010-10-30: qty 1

## 2010-10-30 MED ORDER — LACTATED RINGERS IV SOLN
INTRAVENOUS | Status: DC
  Administered 2010-10-30: 1000 mL via INTRAVENOUS

## 2010-10-30 MED ORDER — MIDAZOLAM HCL 2 MG/2ML IJ SOLN
INTRAMUSCULAR | Status: AC
  Filled 2010-10-30: qty 2

## 2010-10-30 MED ORDER — ONDANSETRON HCL 4 MG/2ML IJ SOLN
INTRAMUSCULAR | Status: AC
  Administered 2010-10-30: 4 mg via INTRAVENOUS
  Filled 2010-10-30: qty 2

## 2010-10-30 MED ORDER — PROPOFOL 10 MG/ML IV EMUL
INTRAVENOUS | Status: AC
  Filled 2010-10-30: qty 20

## 2010-10-30 MED ORDER — SODIUM CHLORIDE 0.9 % IJ SOLN
INTRAMUSCULAR | Status: AC
  Administered 2010-10-30: 10 mL
  Filled 2010-10-30: qty 10

## 2010-10-30 MED ORDER — FENTANYL CITRATE 0.05 MG/ML IJ SOLN
INTRAMUSCULAR | Status: AC
  Filled 2010-10-30: qty 5

## 2010-10-30 MED ORDER — PANTOPRAZOLE SODIUM 40 MG IV SOLR
40.0000 mg | INTRAVENOUS | Status: DC
  Administered 2010-10-30 – 2010-11-05 (×7): 40 mg via INTRAVENOUS
  Filled 2010-10-30 (×7): qty 40

## 2010-10-30 MED ORDER — POVIDONE-IODINE 10 % EX OINT
TOPICAL_OINTMENT | CUTANEOUS | Status: AC
  Filled 2010-10-30: qty 2

## 2010-10-30 MED ORDER — SODIUM CHLORIDE 0.9 % IR SOLN
Status: DC | PRN
  Administered 2010-10-30: 4000 mL

## 2010-10-30 MED ORDER — ENOXAPARIN SODIUM 30 MG/0.3ML ~~LOC~~ SOLN
30.0000 mg | Freq: Once | SUBCUTANEOUS | Status: AC
  Administered 2010-10-30: 30 mg via SUBCUTANEOUS

## 2010-10-30 MED ORDER — ACETAMINOPHEN 10 MG/ML IV SOLN
INTRAVENOUS | Status: AC
  Filled 2010-10-30: qty 100

## 2010-10-30 MED ORDER — ALVIMOPAN 12 MG PO CAPS
ORAL_CAPSULE | ORAL | Status: AC
  Administered 2010-10-30: 12 mg via ORAL
  Filled 2010-10-30: qty 1

## 2010-10-30 MED ORDER — NEOSTIGMINE METHYLSULFATE 1 MG/ML IJ SOLN
INTRAMUSCULAR | Status: AC
  Filled 2010-10-30: qty 10

## 2010-10-30 MED ORDER — FENTANYL CITRATE 0.05 MG/ML IJ SOLN
INTRAMUSCULAR | Status: DC | PRN
  Administered 2010-10-30 (×10): 50 ug via INTRAVENOUS

## 2010-10-30 MED ORDER — HYDROMORPHONE HCL 1 MG/ML IJ SOLN
1.0000 mg | INTRAMUSCULAR | Status: DC | PRN
  Administered 2010-10-31 – 2010-11-07 (×44): 1 mg via INTRAVENOUS
  Filled 2010-10-30 (×46): qty 1

## 2010-10-30 MED ORDER — SODIUM CHLORIDE 0.9 % IV SOLN
INTRAVENOUS | Status: DC | PRN
  Administered 2010-10-30 (×2): via INTRAVENOUS

## 2010-10-30 MED ORDER — ERTAPENEM SODIUM 1 G IJ SOLR
INTRAMUSCULAR | Status: DC | PRN
  Administered 2010-10-30: 1 g via INTRAMUSCULAR

## 2010-10-30 MED ORDER — SUCCINYLCHOLINE CHLORIDE 20 MG/ML IJ SOLN
INTRAMUSCULAR | Status: AC
  Filled 2010-10-30: qty 1

## 2010-10-30 MED ORDER — ALVIMOPAN 12 MG PO CAPS
12.0000 mg | ORAL_CAPSULE | Freq: Once | ORAL | Status: AC
  Administered 2010-10-30: 12 mg via ORAL

## 2010-10-30 MED ORDER — FENTANYL CITRATE 0.05 MG/ML IJ SOLN
INTRAMUSCULAR | Status: AC
  Filled 2010-10-30: qty 2

## 2010-10-30 MED ORDER — SODIUM CHLORIDE 0.9 % IV SOLN: 1.0000 g | INTRAVENOUS | Status: DC

## 2010-10-30 MED ORDER — SUFENTANIL CITRATE 50 MCG/ML IV SOLN
INTRAVENOUS | Status: DC | PRN
  Administered 2010-10-30: 10 ug via INTRAVENOUS
  Administered 2010-10-30: 5 ug via INTRAVENOUS
  Administered 2010-10-30 (×2): 10 ug via INTRAVENOUS
  Administered 2010-10-30: 5 ug via INTRAVENOUS
  Administered 2010-10-30: 10 ug via INTRAVENOUS
  Administered 2010-10-30: 5 ug via INTRAVENOUS
  Administered 2010-10-30: 10 ug via INTRAVENOUS
  Administered 2010-10-30: 5 ug via INTRAVENOUS

## 2010-10-30 SURGICAL SUPPLY — 66 items
APPLIER CLIP 11 MED OPEN (CLIP) ×2
APPLIER CLIP 13 LRG OPEN (CLIP)
BAG HAMPER (MISCELLANEOUS) ×2 IMPLANT
BARRIER SKIN 2 3/4 (OSTOMY) IMPLANT
BARRIER SKIN WITH FLANGE 1 3/4 (OSTOMY) IMPLANT
CLAMP POUCH DRAINAGE QUIET (OSTOMY) IMPLANT
CLIP APPLIE 11 MED OPEN (CLIP) ×1 IMPLANT
CLIP APPLIE 13 LRG OPEN (CLIP) IMPLANT
CLOTH BEACON ORANGE TIMEOUT ST (SAFETY) ×2 IMPLANT
COVER LIGHT HANDLE STERIS (MISCELLANEOUS) ×4 IMPLANT
DRAPE INCISE IOBAN 44X35 STRL (DRAPES) ×2 IMPLANT
DRAPE WARM FLUID 44X44 (DRAPE) ×2 IMPLANT
DURAPREP 26ML APPLICATOR (WOUND CARE) ×2 IMPLANT
ELECT BLADE 6 FLAT ULTRCLN (ELECTRODE) ×2 IMPLANT
ELECT REM PT RETURN 9FT ADLT (ELECTROSURGICAL) ×2
ELECTRODE REM PT RTRN 9FT ADLT (ELECTROSURGICAL) ×1 IMPLANT
EVACUATOR DRAINAGE 10X20 100CC (DRAIN) ×2 IMPLANT
EVACUATOR SILICONE 100CC (DRAIN) ×2
FORMALIN 10 PREFIL 480ML (MISCELLANEOUS) IMPLANT
GLOVE BIO SURGEON STRL SZ7.5 (GLOVE) ×4 IMPLANT
GLOVE ECLIPSE 7.0 STRL STRAW (GLOVE) ×4 IMPLANT
GLOVE INDICATOR 7.0 STRL GRN (GLOVE) ×2 IMPLANT
GLOVE INDICATOR 7.5 STRL GRN (GLOVE) ×6 IMPLANT
GLOVE SS BIOGEL STRL SZ 6.5 (GLOVE) ×1 IMPLANT
GLOVE SUPERSENSE BIOGEL SZ 6.5 (GLOVE) ×1
GOWN BRE IMP SLV AUR XL STRL (GOWN DISPOSABLE) ×8 IMPLANT
INST SET MAJOR GENERAL (KITS) ×2 IMPLANT
KIT ROOM TURNOVER APOR (KITS) ×2 IMPLANT
LIGASURE IMPACT 36 18CM CVD LR (INSTRUMENTS) IMPLANT
MANIFOLD NEPTUNE II (INSTRUMENTS) ×2 IMPLANT
NS IRRIG 1000ML POUR BTL (IV SOLUTION) ×4 IMPLANT
PACK ABDOMINAL MAJOR (CUSTOM PROCEDURE TRAY) ×2 IMPLANT
PAD ARMBOARD 7.5X6 YLW CONV (MISCELLANEOUS) ×2 IMPLANT
POUCH OSTOMY 2 3/4  H 3804 (WOUND CARE)
POUCH OSTOMY 2 PC DRNBL 2.25 (WOUND CARE) IMPLANT
POUCH OSTOMY 2 PC DRNBL 2.75 (WOUND CARE) IMPLANT
POUCH OSTOMY DRNBL 2 1/4 (WOUND CARE)
RELOAD LINEAR CUT PROX 55 BLUE (ENDOMECHANICALS) ×4 IMPLANT
RELOAD PROXIMATE 75MM BLUE (ENDOMECHANICALS) IMPLANT
SEALER TISSUE X1 CVD JAW (INSTRUMENTS) ×2 IMPLANT
SET BASIN LINEN APH (SET/KITS/TRAYS/PACK) ×2 IMPLANT
SPONGE DRAIN TRACH 4X4 STRL 2S (GAUZE/BANDAGES/DRESSINGS) ×4 IMPLANT
SPONGE GAUZE 4X4 12PLY (GAUZE/BANDAGES/DRESSINGS) ×2 IMPLANT
SPONGE LAP 18X18 X RAY DECT (DISPOSABLE) ×8 IMPLANT
STAPLER AUT SUT LDS 15W (STAPLE) IMPLANT
STAPLER GUN LINEAR PROX 60 (STAPLE) ×2 IMPLANT
STAPLER PROXIMATE 55 BLUE (STAPLE) ×2 IMPLANT
STAPLER PROXIMATE 75MM BLUE (STAPLE) IMPLANT
SUCTION POOLE TIP (SUCTIONS) ×2 IMPLANT
SUT CHROMIC 0 SH (SUTURE) ×2 IMPLANT
SUT CHROMIC 3 0 SH 27 (SUTURE) ×2 IMPLANT
SUT ETHILON 3 0 FSL (SUTURE) ×2 IMPLANT
SUT NOVA NAB GS-26 0 60 (SUTURE) ×4 IMPLANT
SUT PROLENE 0 CT 1 CR/8 (SUTURE) ×4 IMPLANT
SUT PROLENE 2 0 FS (SUTURE) ×4 IMPLANT
SUT SILK 2 0 (SUTURE)
SUT SILK 2 0 REEL (SUTURE) IMPLANT
SUT SILK 2-0 18XBRD TIE 12 (SUTURE) IMPLANT
SUT SILK 3 0 SH CR/8 (SUTURE) ×2 IMPLANT
SUT VIC AB 0 CT1 27 (SUTURE) ×1
SUT VIC AB 0 CT1 27XCR 8 STRN (SUTURE) ×1 IMPLANT
TAPE CLOTH SURG 4X10 WHT LF (GAUZE/BANDAGES/DRESSINGS) ×2 IMPLANT
TOWEL BLUE STERILE X RAY DET (MISCELLANEOUS) ×2 IMPLANT
TOWEL OR 17X26 4PK STRL BLUE (TOWEL DISPOSABLE) ×2 IMPLANT
TRAY FOLEY CATH 14FR (SET/KITS/TRAYS/PACK) ×2 IMPLANT
YANKAUER SUCT BULB TIP NO VENT (SUCTIONS) ×2 IMPLANT

## 2010-10-30 NOTE — Op Note (Signed)
Patient:  Peter [name]  DOB:  23-May-1950  MRN:  [id number]   Preop Diagnosis:  Colon carcinoma, anastomotic leak, status post ileostomy placement  Postop Diagnosis:  Same  Procedure:  Partial colectomy with ileostomy takedown  Surgeon:  Franky Macho, M.D.  Assistant surgeon: Tilford Pillar, M.D.  Anes:  Gen. endotracheal  Indications:  Patient is a 60 year old white male status post right hemicolectomy in January of 2012 who had a complicated postoperative course including anastomotic leak, renal failure, ventilatory dependent respiratory failure, and malnutrition. He ultimately had to undergo an ileostomy placement for anastomotic leak. He now comes back to the operating room for takedown of his ileostomy and excision of the left-sided ileocolic anastomosis as it is continuing to leak. The risks and benefits of the procedure including bleeding, infection, cardiopulmonary difficulties, possible blood transfusion were fully explained to the patient, gave informed consent.  Procedure note:  Patient is placed the supine position. After induction of general endotracheal anesthesia, the abdomen was prepped and draped using usual sterile technique with Betadine. Surgical site confirmation was performed.  A midline incision was made to the previous midline incision the peritoneal cavity was entered into with moderate difficulty due to 2 significant adhesive disease. Ultimately, we were able to identify the anastomotic area in the left lower quadrant. A fistulous track was noted into the mesentery of the ileocolic anastomosis. Care was taken to avoid the left ureter. The ileostomy was taken down and the adhesions were lysed up to the ligament of Treitz. A small serosal tear was noted at the ligament of Treitz and this was oversewn using 3-0 silk sutures. No enterotomies were noted. The left-sided ileocolic anastomosis was fully identified. A GIA stapler was placed across the mid sigmoid colon and  fired. The mesentery was then divided using the Enseal ligator. The specimen was then sent to pathology further examination. A side to side ileocolic anastomosis was then performed. The enterotomy was closed using a TA 60 stapler. The staple line was reinforced using 3-0 silk Lembert sutures. The abdominal cavity was then copiously irrigated normal saline. Any bleeding was controlled using clips and 6 Bovie electrocautery. A #10 flat Jackson-Pratt drains were then placed into the left and right paracolic gutters. Both were secured at the skin level using 3-0 nylon interrupted sutures. All surgical personnel then changed their gloves.  The fascia was reapproximated using interrupted 0 Prolene sutures. The ileostomy site was closed internally using 0 Vicryl interrupted sutures and externally using 0 Prolene interrupted sutures. All wounds were irrigated normal saline. All skin incisions were reapproximated approximated loosely using staples. Betadine ointment dorsal dressings were applied.  All tape and needle counts were correct at the end of the procedure. Patient was extubated in the operating room and went back to recovery room awake in stable condition.  Complications:  None  EBL:  500 cc  Transfusions: 3 units packed red blood cells  Specimen:  Ileocolic anastomosis

## 2010-10-30 NOTE — Plan of Care (Signed)
Problem: Consults Goal: Nutrition Consult-if indicated Outcome: Progressing Consult placed.

## 2010-10-30 NOTE — Interval H&P Note (Signed)
History and Physical Interval Note:   10/30/2010   10:08 AM   Peter [name]  has presented today for surgery, with the diagnosis of colon cancer  The various methods of treatment have been discussed with the patient and family. After consideration of risks, benefits and other options for treatment, the patient has consented to  Procedure(s): PARTIAL COLECTOMY ILEOSTOMY TAKEDOWN as a surgical intervention .  I have reviewed the patients' chart and labs.  Questions were answered to the patient's satisfaction.     Dalia Heading  MD

## 2010-10-30 NOTE — Anesthesia Preprocedure Evaluation (Addendum)
Anesthesia Evaluation  Name, MR# and DOB Patient awake  General Assessment Comment  Reviewed: Allergy & Precautions, H&P , NPO status , Patient's Chart, lab work & pertinent test results  History of Anesthesia Complications Negative for: history of anesthetic complications  Airway Mallampati: II  Neck ROM: Full    Dental  (+) Teeth Intact and Implants   Pulmonary    pulmonary exam normalPulmonary Exam Normal     Cardiovascular Regular Normal    Neuro/Psych   GI/Hepatic/Renal        GERD Medicated and Controlled  (+) Cirrhosis -,  ARF and CRFRenal diseaseHx ETOH Dehydration due to high output from ileostomy fitula.  Endo/Other    Abdominal   Musculoskeletal   Hematology   Peds  Reproductive/Obstetrics    Anesthesia Other Findings             Anesthesia Physical Anesthesia Plan  ASA: III  Anesthesia Plan: General   Post-op Pain Management:    Induction: Intravenous, Rapid sequence and Cricoid pressure planned  Airway Management Planned: Oral ETT  Additional Equipment:   Intra-op Plan:   Post-operative Plan: Extubation in OR  Informed Consent: I have reviewed the patients History and Physical, chart, labs and discussed the procedure including the risks, benefits and alternatives for the proposed anesthesia with the patient or authorized representative who has indicated his/her understanding and acceptance.     Plan Discussed with: CRNA  Anesthesia Plan Comments: (Transfuse to replace blood loss.)        Anesthesia Quick Evaluation

## 2010-10-30 NOTE — Anesthesia Procedure Notes (Addendum)
Date/Time: 10/30/2010 11:27 AM Performed by: Glynn Octave    Procedure Name: Intubation Date/Time: 10/30/2010 11:27 AM Performed by: Glynn Octave Pre-anesthesia Checklist: Patient identified, Patient being monitored, Timeout performed, Emergency Drugs available and Suction available Patient Re-evaluated:Patient Re-evaluated prior to inductionOxygen Delivery Method: Circle System Utilized Preoxygenation: Pre-oxygenation with 100% oxygen Intubation Type: IV induction Ventilation: Mask ventilation without difficulty Laryngoscope Size: Mac and 3 Grade View: Grade I Tube type: Oral Tube size: 8.0 mm Number of attempts: 1 Airway Equipment and Method: stylet Placement Confirmation: ETT inserted through vocal cords under direct vision,  positive ETCO2 and breath sounds checked- equal and bilateral Secured at: 21 cm Tube secured with: Tape Dental Injury: Teeth and Oropharynx as per pre-operative assessment

## 2010-10-30 NOTE — Transfer of Care (Signed)
Immediate Anesthesia Transfer of Care Note  Patient: Peter [name]  Procedure(s) Performed:  PARTIAL COLECTOMY; ILEOSTOMY TAKEDOWN  Patient Location: PACU  Anesthesia Type: General  Level of Consciousness: sedated  Airway & Oxygen Therapy: Patient Spontanous Breathing and Patient connected to face mask oxygen  Post-op Assessment: Report given to PACU RN  Post vital signs: stable  Complications: No apparent anesthesia complications

## 2010-10-30 NOTE — OR Nursing (Signed)
JP drain removed prior to prep by Dr. Franky Macho. Ileostomy bag removed by T. Shadia Larose,RN prior to prep

## 2010-10-30 NOTE — Anesthesia Postprocedure Evaluation (Signed)
  Anesthesia Post-op Note  Patient: Peter [name]  Procedure(s) Performed:  PARTIAL COLECTOMY; ILEOSTOMY TAKEDOWN  Patient Location: PACU  Anesthesia Type: General  Level of Consciousness: awake and oriented  Airway and Oxygen Therapy: Patient Spontanous Breathing  Post-op Pain: none  Post-op Assessment: Post-op Vital signs reviewed, Patient's Cardiovascular Status Stable and Respiratory Function Stable  Post-op Vital Signs: stable  Complications: No apparent anesthesia complications

## 2010-10-31 LAB — TYPE AND SCREEN
Unit division: 0
Unit division: 0

## 2010-10-31 LAB — CBC
MCHC: 35 g/dL (ref 30.0–36.0)
MCV: 91.4 fL (ref 78.0–100.0)
Platelets: 164 10*3/uL (ref 150–400)
RDW: 16.7 % — ABNORMAL HIGH (ref 11.5–15.5)
WBC: 25.7 10*3/uL — ABNORMAL HIGH (ref 4.0–10.5)

## 2010-10-31 LAB — HEPATIC FUNCTION PANEL
ALT: 16 U/L (ref 0–53)
Bilirubin, Direct: 0.4 mg/dL — ABNORMAL HIGH (ref 0.0–0.3)
Indirect Bilirubin: 0.7 mg/dL (ref 0.3–0.9)
Total Protein: 5.6 g/dL — ABNORMAL LOW (ref 6.0–8.3)

## 2010-10-31 LAB — PHOSPHORUS: Phosphorus: 5.9 mg/dL — ABNORMAL HIGH (ref 2.3–4.6)

## 2010-10-31 LAB — BASIC METABOLIC PANEL
Calcium: 6.6 mg/dL — ABNORMAL LOW (ref 8.4–10.5)
GFR calc Af Amer: 45 mL/min — ABNORMAL LOW (ref >60)
GFR calc non Af Amer: 37 mL/min — ABNORMAL LOW (ref >60)
Potassium: 3.9 mEq/L (ref 3.5–5.1)
Sodium: 140 mEq/L (ref 135–145)

## 2010-10-31 MED ORDER — MAGNESIUM SULFATE 50 % IJ SOLN
2.0000 g | Freq: Once | INTRAMUSCULAR | Status: DC
  Administered 2010-10-31: 2 g via INTRAVENOUS
  Filled 2010-10-31: qty 4

## 2010-10-31 MED ORDER — ALBUMIN HUMAN 25 % IV SOLN
25.0000 g | Freq: Once | INTRAVENOUS | Status: AC
  Administered 2010-10-31: 25 g via INTRAVENOUS
  Filled 2010-10-31: qty 100

## 2010-10-31 MED ORDER — MAGNESIUM SULFATE 50 % IJ SOLN
2.0000 g | Freq: Once | INTRAMUSCULAR | Status: AC
  Administered 2010-10-31: 2 g via INTRAVENOUS
  Filled 2010-10-31: qty 4

## 2010-10-31 MED ORDER — MAGNESIUM SULFATE 40 MG/ML IJ SOLN
2.0000 g | Freq: Once | INTRAMUSCULAR | Status: AC
  Administered 2010-10-31: 2 g via INTRAVENOUS
  Filled 2010-10-31 (×2): qty 50

## 2010-10-31 NOTE — Progress Notes (Signed)
Quick Note:  All stool studies negative. Since OV, patient had another segment of colon removed and reversal of ileostomy. Let's have patient come back in 4 -6 weeks for f/u. ______

## 2010-10-31 NOTE — Progress Notes (Signed)
INITIAL ADULT NUTRITION ASSESSMENT Date: 10/31/2010   Time: 2:29 PM Reason for Assessment: consult for malnutrition, unplanned wt loss  ASSESSMENT: Male 60 y.o.  Dx: <principal problem not specified>  Hx:  Past Medical History  Diagnosis Date  . Gout   . GERD (gastroesophageal reflux disease)   . Cirrhosis     ?ETOH related  . Colon cancer Dec 2011    Cecum  . ARF (acute renal failure)     hospitalization Jan 2012  . H/O ETOH abuse   . 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 S/P endoscopy Dec 2011    Schatzki's ring, Grade 1 esophageal varices, antral/body erosions  . Anemia of chronic disease 08/10/2010  . Anemia   . Renal insufficiency 10/24/2010  . Renal insufficiency 10/24/2010  . S/P right colectomy     Related Meds:  Scheduled Meds:   . acetaminophen      . albumin human  25 g Intravenous Once  . enoxaparin  30 mg Subcutaneous Q24H  . ertapenem (INVANZ) IV  1 g Intravenous Q24H  . fentaNYL      . glycopyrrolate      . lidocaine      . magnesium sulfate infusion  2 g Intravenous Once  . magnesium sulfate infusion  2 g Intravenous Once  . midazolam      . neostigmine      . pantoprazole (PROTONIX) IV  40 mg Intravenous Q24H  . propofol      . rocuronium      . rocuronium      . sodium chloride      . sodium chloride      . ertapenem [facility]) IV      . succinylcholine      . SUFentanil      . SUFentanil      . DISCONTD: acetaminophen  1,000 mg Intravenous Q6H  . DISCONTD: ertapenem  1 g Intravenous 60 min Pre-Op  . DISCONTD: magnesium sulfate  2 g Intravenous Once   Continuous Infusions:   . lactated ringers 125 mL/hr at 10/31/10 1400  . DISCONTD: lactated ringers 1,000 mL (10/30/10 1038)   PRN Meds:.HYDROmorphone, ondansetron, DISCONTD: fentaNYL, DISCONTD: midazolam, DISCONTD: ondansetron (ZOFRAN) IV   Ht: 5'10"  Wt: 133#  Ideal Wt: 166# % Ideal Wt: 80%  Usual Wt: 176# % Usual Wt: 76%  BMI:  19.08  Food/Nutrition Related Hx: Pt s/p partial colectomy with takedown on ileostomy on 10/30/10. Pt with hx of wt loss, malaise, and fatigue. Noted pt weighed 176# in 12/11 and 121# in 6/12. Pt had a rt hemicolectomy in 1/12 with a prolonged hospitalization. Good appetite prior to admission per office notes reviewed. Pt currently with ng tube for suction. Pt currently NPO, but noted tolerating ice chips well.   Labs:  CBC    Component Value Date/Time   WBC 25.7* 10/31/2010 0430   RBC 3.47* 10/31/2010 0430   HGB 11.1* 10/31/2010 0430   HCT 31.7* 10/31/2010 0430   PLT 164 10/31/2010 0430   MCV 91.4 10/31/2010 0430   MCH 32.0 10/31/2010 0430   MCHC 35.0 10/31/2010 0430   RDW 16.7* 10/31/2010 0430   LYMPHSABS 1.8 10/27/2010 1400   MONOABS 0.4 10/27/2010 1400   EOSABS 0.2 10/27/2010 1400   BASOSABS 0.0 10/27/2010 1400     CMP     Component Value Date/Time   NA 140 10/31/2010 0430   K 3.9 10/31/2010 0430  CL 100 10/31/2010 0430   CO2 22 10/31/2010 0430   GLUCOSE 136* 10/31/2010 0430   BUN 16 10/31/2010 0430   CREATININE 1.89* 10/31/2010 0430   CALCIUM 6.6* 10/31/2010 0430   PROT 5.6* 10/31/2010 0430   ALBUMIN 2.6* 10/31/2010 0430   AST 24 10/31/2010 0430   ALT 16 10/31/2010 0430   ALKPHOS 85 10/31/2010 0430   BILITOT 1.1 10/31/2010 0430   GFRNONAA 37* 10/31/2010 0430   GFRAA 45* 10/31/2010 0430      Intake:   Intake/Output Summary (Last 24 hours) at 10/31/10 1600 Last data filed at 10/31/10 1400  Gross per 24 hour  Intake 1631.58 ml  Output   1100 ml  Net 531.58 ml      Diet Order: NPO  Supplements/Tube Feeding: none at this time  IVF:    lactated ringers Last Rate: 125 mL/hr at 10/31/10 1400  DISCONTD: lactated ringers Last Rate: 1,000 mL (10/30/10 1038)    Estimated Nutritional Needs:   Kcal:2100-2400 kcals daily Protein:60-75 grams protein daily Fluid:2.1-2.4 L fluid daily  NUTRITION DIAGNOSIS: -Inadequate oral intake (NI-2.1).  Status: Ongoing  RELATED TO: altered GI  function  AS EVIDENCE BY: NPO, ng tube for suction, hx of significant wt loss.  MONITORING/EVALUATION(Goals): -Pt will maintain current wt of 133# -Pt will meet >75% of estimated energy needs -Pt will be advanced to PO diet, as medically appropriate  EDUCATION NEEDS: -Education not appropriate at this time due to NPO  INTERVENTION: Will follow for diet advancement, as medically appropriate. Will monitor for diet tolerance and adequate PO intake.   Dietitian #: [phone]  DOCUMENTATION CODES Per approved criteria  -Underweight    Orlene Plum 10/31/2010, 2:29 PM

## 2010-10-31 NOTE — Progress Notes (Signed)
1 Day Post-Op  Subjective: Resting comfortably. Incisional pain well controlled with pain medication. In good spirits.  Objective: Vital signs in last 24 hours: Temp:  [97.3 F (36.3 C)-98.2 F (36.8 C)] 98.2 F (36.8 C) (09/11 0700) Pulse Rate:  [72-130] 127  (09/11 0900) Resp:  [8-27] 12  (09/11 0900) BP: (86-128)/(57-97) 90/72 mmHg (09/11 0900) SpO2:  [79 %-100 %] 99 % (09/11 0900)    Intake/Output from previous day: 09/10 0701 - 09/11 0700 In: 4591.6 [I.V.:3539.6; Blood:1050; IV Piggyback:2] Out: 1125 [Urine:80; Emesis/NG output:100; Drains:325; Blood:500] Intake/Output this shift:    General appearance: alert, cooperative and no distress Resp: clear to auscultation bilaterally Cardio: regular rate and rhythm, S1, S2 normal, no murmur, click, rub or gallop. Sinus tachycardia noted. Abdomen: Soft, flat. Dressing intact. JP drainage is sanguinous in nature.  Lab Results:   Basename 10/31/10 0430 10/30/10 1530  WBC 25.7* --  HGB 11.1* 13.2  HCT 31.7* 37.7*  PLT 164 --   BMET  Basename 10/31/10 0430 10/30/10 1355  NA 140 140  K 3.9 3.2*  CL 100 --  CO2 22 --  GLUCOSE 136* 106*  BUN 16 --  CREATININE 1.89* --  CALCIUM 6.6* --   PT/INR No results found for this basename: LABPROT:2,INR:2 in the last 72 hours  Studies/Results: No results found.  Anti-infectives: Anti-infectives    None      Assessment/Plan: s/p Procedure(s): PARTIAL COLECTOMY ILEOSTOMY TAKEDOWN Impression: Stable postoperatively. I suspect listed urine output is wrong as there was a significant amount of urine in the Foley bag. Tachycardia may be related to low magnesium level. Plan: Supplement magnesium. Continue to monitor heart rate. Continue medications as prescribed.   LOS: 1 day    Peter [name] A 10/31/2010

## 2010-10-31 NOTE — Progress Notes (Signed)
UR Chart Review Completed  

## 2010-10-31 NOTE — Progress Notes (Signed)
E-link MD updated on pt  Magnesium leve=0.6

## 2010-11-01 ENCOUNTER — Inpatient Hospital Stay (HOSPITAL_COMMUNITY): Payer: Medicaid Other

## 2010-11-01 ENCOUNTER — Encounter (HOSPITAL_COMMUNITY): Payer: Medicaid Other

## 2010-11-01 LAB — CBC
HCT: 21.9 % — ABNORMAL LOW (ref 39.0–52.0)
Hemoglobin: 7.7 g/dL — ABNORMAL LOW (ref 13.0–17.0)
MCHC: 35.2 g/dL (ref 30.0–36.0)
RBC: 2.39 MIL/uL — ABNORMAL LOW (ref 4.22–5.81)
WBC: 15.9 10*3/uL — ABNORMAL HIGH (ref 4.0–10.5)

## 2010-11-01 LAB — PHOSPHORUS: Phosphorus: 5 mg/dL — ABNORMAL HIGH (ref 2.3–4.6)

## 2010-11-01 LAB — MAGNESIUM: Magnesium: 1.6 mg/dL (ref 1.5–2.5)

## 2010-11-01 LAB — PREPARE RBC (CROSSMATCH)

## 2010-11-01 MED ORDER — ALPRAZOLAM 0.5 MG PO TABS
0.5000 mg | ORAL_TABLET | Freq: Three times a day (TID) | ORAL | Status: DC | PRN
  Administered 2010-11-01: 0.5 mg via ORAL
  Filled 2010-11-01: qty 1

## 2010-11-01 MED ORDER — SODIUM CHLORIDE 0.9 % IV SOLN
1.0000 g | Freq: Once | INTRAVENOUS | Status: AC
  Administered 2010-11-01: 1 g via INTRAVENOUS
  Filled 2010-11-01: qty 10

## 2010-11-01 MED ORDER — KCL IN DEXTROSE-NACL 20-5-0.45 MEQ/L-%-% IV SOLN
INTRAVENOUS | Status: DC
  Administered 2010-11-01 – 2010-11-02 (×3): via INTRAVENOUS
  Administered 2010-11-03 – 2010-11-05 (×2): 1000 mL via INTRAVENOUS

## 2010-11-01 MED ORDER — SODIUM CHLORIDE 0.9 % IJ SOLN
10.0000 mL | Freq: Two times a day (BID) | INTRAMUSCULAR | Status: DC
  Administered 2010-11-01 – 2010-11-07 (×10): 10 mL
  Filled 2010-11-01 (×5): qty 10
  Filled 2010-11-01: qty 20
  Filled 2010-11-01 (×3): qty 10

## 2010-11-01 MED ORDER — NYSTATIN 100000 UNIT/GM EX CREA
TOPICAL_CREAM | Freq: Two times a day (BID) | CUTANEOUS | Status: DC
  Administered 2010-11-01 – 2010-11-03 (×5): via TOPICAL
  Administered 2010-11-03: 1 via TOPICAL
  Administered 2010-11-04: 11:00:00 via TOPICAL
  Administered 2010-11-04 – 2010-11-05 (×2): 1 via TOPICAL
  Administered 2010-11-05 – 2010-11-06 (×3): via TOPICAL
  Filled 2010-11-01 (×3): qty 15

## 2010-11-01 MED ORDER — SODIUM CHLORIDE 0.9 % IJ SOLN
10.0000 mL | INTRAMUSCULAR | Status: DC | PRN
  Filled 2010-11-01: qty 10

## 2010-11-01 NOTE — Anesthesia Post-op Follow-up Note (Signed)
In ICU 6.Stable, Follow Up complete

## 2010-11-01 NOTE — Addendum Note (Signed)
Addendum  created 11/01/10 4782 by Marylene Buerger, CRNA   Modules edited:Anesthesia Events

## 2010-11-01 NOTE — Progress Notes (Signed)
2 Days Post-Op  Subjective: Sitting up in chair. Mild incisional pain well controlled with intravenous morphine.  Objective: Vital signs in last 24 hours: Temp:  [97.4 F (36.3 C)-99.1 F (37.3 C)] 98.1 F (36.7 C) (09/12 1200) Pulse Rate:  [38-126] 109  (09/12 1200) Resp:  [10-17] 16  (09/12 1200) BP: (96-123)/(60-81) 118/74 mmHg (09/12 1200) SpO2:  [93 %-100 %] 100 % (09/12 1200) Weight:  [66.1 kg (145 lb 11.6 oz)] 145 lb 11.6 oz (66.1 kg) (09/12 0400)    Intake/Output from previous day: 09/11 0701 - 09/12 0700 In: 3545 [P.O.:360; I.V.:3125; IV Piggyback:60] Out: 1410 [Urine:550; Emesis/NG output:800; Drains:60] Intake/Output this shift: Total I/O In: 870 [P.O.:120; I.V.:750] Out: 200 [Emesis/NG output:200]  General appearance: alert, cooperative and no distress Resp: clear to auscultation bilaterally Cardio: regular rate and rhythm, S1, S2 normal, no murmur, click, rub or gallop Abdomen: Soft, flat. Incisions healing well. Jackson-Pratt drainage sanguinous in nature.  Lab Results:   Basename 11/01/10 0442 10/31/10 0430  WBC 15.9* 25.7*  HGB 7.7* 11.1*  HCT 21.9* 31.7*  PLT 124* 164   BMET  Basename 10/31/10 0430 10/30/10 1355  NA 140 140  K 3.9 3.2*  CL 100 --  CO2 22 --  GLUCOSE 136* 106*  BUN 16 --  CREATININE 1.89* --  CALCIUM 6.6* --   PT/INR No results found for this basename: LABPROT:2,INR:2 in the last 72 hours  Studies/Results: No results found.  Anti-infectives: Anti-infectives    None      Assessment/Plan: s/p Procedure(s): PARTIAL COLECTOMY ILEOSTOMY TAKEDOWN Impression: Anemia secondary to surgery. Leukocytosis resolving. Awaiting return of bowel function. Renal insufficiency stable. Hypocalcemia. Hypomagnesemia resolved. Plan: We'll transfuse 2 units of packed red blood cells. Nystatin cream to former ileostomy site. Continue step down unit monitoring. Supplement calcium.  LOS: 2 days    Peter [name] A 11/01/2010

## 2010-11-01 NOTE — Addendum Note (Signed)
Addendum  created 11/01/10 0721 by Marylene Buerger, CRNA   Modules edited:Notes Section

## 2010-11-01 NOTE — Progress Notes (Signed)
MEDICATION RELATED CONSULT NOTE - INITIAL   Pharmacy Consult for Calcium Replacement Indication: Hypocaclemia  Allergies  Allergen Reactions  . Ativan Other (See Comments)    Causes Hallucinations, makes "crazy"  . Percocet (Oxycodone-Acetaminophen) Itching and Nausea Only    Patient Measurements: Weight: 145 lb 11.6 oz (66.1 kg)  Vital Signs: Temp: 99.1 F (37.3 C) (09/12 0400) Temp src: Axillary (09/12 0400) BP: 109/69 mmHg (09/12 0800) Pulse Rate: 109  (09/12 0800) Intake/Output from previous day: 09/11 0701 - 09/12 0700 In: 2675 [P.O.:240; I.V.:2375; IV Piggyback:60] Out: 1410 [Urine:550; Emesis/NG output:800; Drains:60]  Labs:  [facility] 11/01/10 0442 10/31/10 0430 10/30/10 1530  WBC 15.9* 25.7* --  HGB 7.7* 11.1* 13.2  HCT 21.9* 31.7* 37.7*  PLT 124* 164 --  APTT -- -- --  CREATININE -- 1.89* --  LABCREA -- -- --  CREATININE -- 1.89* --  CREAT24HRUR -- -- --  CRCLEARANCE -- -- --  MG 1.6 0.6* --  PHOS 5.0* 5.9* --  ALBUMIN -- 2.6* --  PROT -- 5.6* --  ALBUMIN -- 2.6* --  AST -- 24 --  ALT -- 16 --  ALKPHOS -- 85 --  BILITOT -- 1.1 --  BILIDIR -- 0.4* --  IBILI -- 0.7 --   Calcium = 6.6.  Corrected for Low Albumin = 7.72.  Medical History: Past Medical History  Diagnosis Date  . Gout   . GERD (gastroesophageal reflux disease)   . Cirrhosis     ?ETOH related  . Colon cancer Dec 2011    Cecum  . ARF (acute renal failure)     hospitalization Jan 2012  . H/O ETOH abuse   . 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 S/P endoscopy Dec 2011    Schatzki's ring, Grade 1 esophageal varices, antral/body erosions  . Anemia of chronic disease 08/10/2010  . Anemia   . Renal insufficiency 10/24/2010  . Renal insufficiency 10/24/2010  . S/P right colectomy     Medications:  Scheduled:    . albumin human  25 g Intravenous Once  . enoxaparin  30 mg Subcutaneous Q24H  . ertapenem (INVANZ) IV  1 g Intravenous Q24H    . magnesium sulfate infusion  2 g Intravenous Once  . magnesium sulfate infusion  2 g Intravenous Once  . pantoprazole (PROTONIX) IV  40 mg Intravenous Q24H  . DISCONTD: magnesium sulfate  2 g Intravenous Once    Assessment: Okay for Protocol   Goal of Therapy:  Calcium Level 8.4-10.5  Plan:  Calcium Gluconate 1gm IV bolus. F/U AM labs. PO calcium supplement when able to take PO.   Peter [name] 11/01/2010,8:15 AM

## 2010-11-02 LAB — CBC
HCT: 23.6 % — ABNORMAL LOW (ref 39.0–52.0)
Hemoglobin: 8.2 g/dL — ABNORMAL LOW (ref 13.0–17.0)
MCHC: 34.7 g/dL (ref 30.0–36.0)
MCV: 89.1 fL (ref 78.0–100.0)

## 2010-11-02 LAB — TYPE AND SCREEN
ABO/RH(D): B POS
Antibody Screen: NEGATIVE
Unit division: 0

## 2010-11-02 LAB — BASIC METABOLIC PANEL
BUN: 18 mg/dL (ref 6–23)
CO2: 31 mEq/L (ref 19–32)
Chloride: 103 mEq/L (ref 96–112)
Creatinine, Ser: 1.67 mg/dL — ABNORMAL HIGH (ref 0.50–1.35)
GFR calc Af Amer: 51 mL/min — ABNORMAL LOW (ref >60)
Glucose, Bld: 94 mg/dL (ref 70–99)
Potassium: 3.5 mEq/L (ref 3.5–5.1)

## 2010-11-02 LAB — MAGNESIUM: Magnesium: 1.7 mg/dL (ref 1.5–2.5)

## 2010-11-02 MED ORDER — DIPHENHYDRAMINE HCL 25 MG PO CAPS
25.0000 mg | ORAL_CAPSULE | Freq: Four times a day (QID) | ORAL | Status: DC | PRN
  Administered 2010-11-02: 25 mg via ORAL
  Filled 2010-11-02: qty 1

## 2010-11-02 MED ORDER — DARBEPOETIN ALFA-POLYSORBATE 100 MCG/0.5ML IJ SOLN
100.0000 ug | Freq: Once | INTRAMUSCULAR | Status: AC
  Administered 2010-11-02: 100 ug via SUBCUTANEOUS
  Filled 2010-11-02: qty 0.5

## 2010-11-02 NOTE — Progress Notes (Signed)
MEDICATION RELATED CONSULT NOTE - INITIAL   Pharmacy Consult for Calcium Replacement Indication: Hypocaclemia  Allergies  Allergen Reactions  . Ativan Other (See Comments)    Causes Hallucinations, makes "crazy"  . Percocet (Oxycodone-Acetaminophen) Itching and Nausea Only   Patient Measurements: Weight: 153 lb 14.1 oz (69.8 kg)  Vital Signs: Temp: 98.4 F (36.9 C) (09/13 0700) Temp src: Oral (09/13 0700) BP: 106/60 mmHg (09/13 0800) Pulse Rate: 108  (09/13 0800) Intake/Output from previous day: 09/12 0701 - 09/13 0700 In: 3510 [P.O.:300; I.V.:2475; Blood:675; IV Piggyback:60] Out: 1660 [Urine:700; Emesis/NG output:900; Drains:60]  Labs:  [facility] 11/02/10 0537 11/01/10 0442 10/31/10 0430  WBC 8.5 15.9* 25.7*  HGB 8.2* 7.7* 11.1*  HCT 23.6* 21.9* 31.7*  PLT 73* 124* 164  APTT -- -- --  CREATININE 1.67* -- 1.89*  LABCREA -- -- --  CREATININE 1.67* -- 1.89*  CREAT24HRUR -- -- --  CRCLEARANCE -- -- --  MG 1.7 1.6 0.6*  PHOS 2.8 5.0* 5.9*  ALBUMIN 2.2* -- 2.6*  PROT -- -- 5.6*  ALBUMIN 2.2* -- 2.6*  AST -- -- 24  ALT -- -- 16  ALKPHOS -- -- 85  BILITOT -- -- 1.1  BILIDIR -- -- 0.4*  IBILI -- -- 0.7   Calcium = 7.5 Albumin = 2.2 CORRECTED CALCIUM = 8.9  Medical History: Past Medical History  Diagnosis Date  . Gout   . GERD (gastroesophageal reflux disease)   . Cirrhosis     ?ETOH related  . Colon cancer Dec 2011    Cecum  . ARF (acute renal failure)     hospitalization Jan 2012  . H/O ETOH abuse   . 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 S/P endoscopy Dec 2011    Schatzki's ring, Grade 1 esophageal varices, antral/body erosions  . Anemia of chronic disease 08/10/2010  . Anemia   . Renal insufficiency 10/24/2010  . Renal insufficiency 10/24/2010  . S/P right colectomy    Medications:  Scheduled:     . calcium gluconate  1 g Intravenous Once  . darbepoetin  100 mcg Subcutaneous Once  . ertapenem (INVANZ)  IV  1 g Intravenous Q24H  . nystatin   Topical BID  . pantoprazole (PROTONIX) IV  40 mg Intravenous Q24H  . sodium chloride  10 mL Intracatheter Q12H  . DISCONTD: enoxaparin  30 mg Subcutaneous Q24H    Assessment: CALCIUM CORRECTS due to low albumin Awaiting ionized calcium report (anticipate OK)  Goal of Therapy:  Calcium Level 8.4-10.5  Plan:  No additional IV calcium supplement today F/U AM labs. PO calcium supplement when able to take PO.   Peter [name], Peter [name] 11/02/2010,9:55 AM

## 2010-11-02 NOTE — Progress Notes (Signed)
3 Days Post-Op  Subjective: Had some confusion yesterday evening. He is oriented this morning. No specific complaints noted.  Objective: Vital signs in last 24 hours: Temp:  [97.8 F (36.6 C)-100 F (37.8 C)] 98.4 F (36.9 C) (09/13 0700) Pulse Rate:  [95-119] 108  (09/13 0800) Resp:  [8-19] 16  (09/13 0800) BP: (89-118)/(60-74) 106/60 mmHg (09/13 0800) SpO2:  [92 %-100 %] 94 % (09/13 0800) Weight:  [69.8 kg (153 lb 14.1 oz)] 153 lb 14.1 oz (69.8 kg) (09/13 0400)    Intake/Output from previous day: 09/12 0701 - 09/13 0700 In: 3510 [P.O.:300; I.V.:2475; Blood:675; IV Piggyback:60] Out: 1660 [Urine:700; Emesis/NG output:900; Drains:60] Intake/Output this shift: Total I/O In: 75 [I.V.:75] Out: -   General appearance: alert, cooperative and no distress Resp: clear to auscultation bilaterally Cardio: regular rate and rhythm, S1, S2 normal, no murmur, click, rub or gallop Abdomen: Soft, flat. Incisions healing well. No drainage noted. Bowel sounds appreciated. JP drainage sanguinous and low.  Lab Results:   [facility] 11/02/10 0537 11/01/10 0442  WBC 8.5 15.9*  HGB 8.2* 7.7*  HCT 23.6* 21.9*  PLT 73* 124*   BMET  Basename 11/02/10 0537 10/31/10 0430  NA 138 140  K 3.5 3.9  CL 103 100  CO2 31 22  GLUCOSE 94 136*  BUN 18 16  CREATININE 1.67* 1.89*  CALCIUM 7.5* 6.6*   PT/INR No results found for this basename: LABPROT:2,INR:2 in the last 72 hours  Studies/Results: Chest Portable 1 View Post Insertion To Confirm Placement As Interpreted By Radiologist  11/01/2010  *RADIOLOGY REPORT*  Clinical Data: PICC line placement  PORTABLE CHEST - 1 VIEW  Comparison: Portable exam 1501 hours compared to 03/13/2010  Findings: Tip of right arm PICC line projects over right atrium. Rotated to left. Stable heart size and mediastinal contours. Atelectasis versus infiltrate left lower lobe. Minimal elevation right diaphragm, chronic. Remaining lungs clear. Nasogastric tube extends into  stomach.  IMPRESSION: Tip of right arm PICC line projects over the right atrium; recommend withdrawal 4 cm for positioning in SVC above cavoatrial junction. Atelectasis versus consolidation left lower lobe.  Original Report Authenticated By: Lollie Marrow, M.D.    Anti-infectives: Anti-infectives    None      Assessment/Plan: s/p Procedure(s): PARTIAL COLECTOMY ILEOSTOMY TAKEDOWN Impression: Patient continues to recover slowly but well. Still with anemia. This is prior secondary to both surgery and chronic renal insufficiency. He was receiving erythropoietin injections as an outpatient. Hypocalcemia has improved. Patient noted to have thrombocytopenia.  Plan: Stop Lovenox. Remove nasogastric tube. Start clear liquid diet. Continue to monitor closely and step down unit.  LOS: 3 days    Nai Dasch A 11/02/2010

## 2010-11-03 ENCOUNTER — Encounter (HOSPITAL_COMMUNITY): Payer: Self-pay | Admitting: General Surgery

## 2010-11-03 LAB — CBC
HCT: 25.4 % — ABNORMAL LOW (ref 39.0–52.0)
Hemoglobin: 8.7 g/dL — ABNORMAL LOW (ref 13.0–17.0)
MCH: 31 pg (ref 26.0–34.0)
MCHC: 34.3 g/dL (ref 30.0–36.0)
MCV: 90.4 fL (ref 78.0–100.0)
RDW: 16.9 % — ABNORMAL HIGH (ref 11.5–15.5)

## 2010-11-03 LAB — BASIC METABOLIC PANEL
BUN: 11 mg/dL (ref 6–23)
CO2: 29 mEq/L (ref 19–32)
Chloride: 105 mEq/L (ref 96–112)
Creatinine, Ser: 1.45 mg/dL — ABNORMAL HIGH (ref 0.50–1.35)
Glucose, Bld: 103 mg/dL — ABNORMAL HIGH (ref 70–99)
Potassium: 3.6 mEq/L (ref 3.5–5.1)

## 2010-11-03 LAB — PHOSPHORUS: Phosphorus: 2.5 mg/dL (ref 2.3–4.6)

## 2010-11-03 MED ORDER — BOOST / RESOURCE BREEZE PO LIQD
237.0000 mL | Freq: Three times a day (TID) | ORAL | Status: DC
  Administered 2010-11-03 – 2010-11-06 (×9): 1 via ORAL
  Filled 2010-11-03 (×16): qty 1

## 2010-11-03 MED ORDER — MAGNESIUM HYDROXIDE 400 MG/5ML PO SUSP
30.0000 mL | Freq: Three times a day (TID) | ORAL | Status: DC | PRN
  Administered 2010-11-07: 30 mL via ORAL
  Filled 2010-11-03: qty 30

## 2010-11-03 MED ORDER — ACETAMINOPHEN 500 MG PO TABS: 500.0000 mg | ORAL_TABLET | ORAL | Status: DC | PRN

## 2010-11-03 MED ORDER — SIMETHICONE 80 MG PO CHEW: 80.0000 mg | CHEWABLE_TABLET | Freq: Four times a day (QID) | ORAL | Status: DC | PRN

## 2010-11-03 NOTE — Progress Notes (Signed)
INITIAL ADULT NUTRITION ASSESSMENT Date: 11/03/2010   Time: 12:44 PM Reason for Assessment: consult for malnutrition, unplanned wt loss  ASSESSMENT: Male 60 y.o.  Dx: <principal problem not specified>  Hx:  Past Medical History  Diagnosis Date  . Gout   . GERD (gastroesophageal reflux disease)   . Cirrhosis     ?ETOH related  . Colon cancer Dec 2011    Cecum  . ARF (acute renal failure)     hospitalization Jan 2012  . H/O ETOH abuse   . 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 S/P endoscopy Dec 2011    Schatzki's ring, Grade 1 esophageal varices, antral/body erosions  . Anemia of chronic disease 08/10/2010  . Anemia   . Renal insufficiency 10/24/2010  . Renal insufficiency 10/24/2010  . S/P right colectomy     Related Meds:  Scheduled Meds:    . ertapenem (INVANZ) IV  1 g Intravenous Q24H  . nystatin   Topical BID  . pantoprazole (PROTONIX) IV  40 mg Intravenous Q24H  . sodium chloride  10 mL Intracatheter Q12H   Continuous Infusions:    . dextrose 5 % and 0.45 % NaCl with KCl 20 mEq/L 75 mL/hr at 11/02/10 1743   PRN Meds:.acetaminophen, ALPRAZolam, diphenhydrAMINE, HYDROmorphone, magnesium hydroxide, ondansetron, simethicone, sodium chloride   Ht: 5'10"  Wt: 154#  Ideal Wt: 166# % Ideal Wt: 80%  Usual Wt: 176# % Usual Wt: 76%  BMI: 19.08  Food/Nutrition Related Hx: Pt s/p partial colectomy with takedown on ileostomy on 10/30/10. Pt with hx of wt loss, malaise, and fatigue. Noted pt weighed 176# in 12/11 and 121# in 6/12. Pt had a rt hemicolectomy in 1/12 with a prolonged hospitalization. Good appetite prior to admission per office notes reviewed. Pt currently with ng tube for suction. Pt currently NPO, but noted tolerating ice chips well.  11/03/10- NG tube has been d/c'd. Pt was advanced to a clear liquid diet on 11/02/10. PO intake poor, due nausea. PO: 0%. Noted 21# (16%) wt gain x 4 days, likely due to fluid. Labs:    CBC    Component Value Date/Time   WBC 7.2 11/03/2010 0504   RBC 2.81* 11/03/2010 0504   HGB 8.7* 11/03/2010 0504   HCT 25.4* 11/03/2010 0504   PLT 95* 11/03/2010 0504   MCV 90.4 11/03/2010 0504   MCH 31.0 11/03/2010 0504   MCHC 34.3 11/03/2010 0504   RDW 16.9* 11/03/2010 0504   LYMPHSABS 1.8 10/27/2010 1400   MONOABS 0.4 10/27/2010 1400   EOSABS 0.2 10/27/2010 1400   BASOSABS 0.0 10/27/2010 1400     CMP     Component Value Date/Time   NA 140 11/03/2010 0504   K 3.6 11/03/2010 0504   CL 105 11/03/2010 0504   CO2 29 11/03/2010 0504   GLUCOSE 103* 11/03/2010 0504   BUN 11 11/03/2010 0504   CREATININE 1.45* 11/03/2010 0504   CALCIUM 8.2* 11/03/2010 0504   PROT 5.6* 10/31/2010 0430   ALBUMIN 2.2* 11/03/2010 0504   AST 24 10/31/2010 0430   ALT 16 10/31/2010 0430   ALKPHOS 85 10/31/2010 0430   BILITOT 1.1 10/31/2010 0430   GFRNONAA 50* 11/03/2010 0504   GFRAA >60 11/03/2010 0504      Intake:   Intake/Output Summary (Last 24 hours) at 11/03/10 1434 Last data filed at 11/03/10 0800  Gross per 24 hour  Intake   1770 ml  Output    765 ml  Net   1005 ml      Diet Order: Clear Liquid  Supplements/Tube Feeding: none at this time  IVF:     dextrose 5 % and 0.45 % NaCl with KCl 20 mEq/L Last Rate: 75 mL/hr at 11/02/10 1743    Estimated Nutritional Needs:   Kcal:2100-2400 kcals daily Protein:60-75 grams protein daily Fluid:2.1-2.4 L fluid daily  NUTRITION DIAGNOSIS: -Inadequate oral intake (NI-2.1).  Status: Ongoing  RELATED TO: altered GI function  AS EVIDENCE BY: NPO, ng tube for suction, hx of significant wt loss.  MONITORING/EVALUATION(Goals): -Pt will maintain current wt of 133# Goal met. -Pt will meet >75% of estimated energy needs Goal not met. -Pt will be advanced to PO diet, as medically appropriate Goal met.  EDUCATION NEEDS: -Education not appropriate at this time due to NPO 11/03/10- Pt not available at time of visit INTERVENTION: Will follow for diet advancement, as  medically appropriate. Will monitor for diet tolerance and adequate PO intake.  11/03/10- Add resource Breeze TID for added kcals and protein. Will continue to monitor for diet tolerance, advancement, and adequate PO intake. Dietitian #: [phone]  DOCUMENTATION CODES Per approved criteria  -Underweight    Orlene Plum 11/03/2010, 12:44 PM

## 2010-11-03 NOTE — Progress Notes (Signed)
4 Days Post-Op  Subjective: Some nausea with small emesis. No bowel movement or flatus yet. Incisional pain well controlled.  Objective: Vital signs in last 24 hours: Temp:  [97.9 F (36.6 C)-99 F (37.2 C)] 97.9 F (36.6 C) (09/14 0800) Pulse Rate:  [86-103] 91  (09/14 0800) Resp:  [10-16] 14  (09/14 0900) BP: (96-120)/(63-80) 116/77 mmHg (09/14 0900) SpO2:  [94 %-97 %] 95 % (09/14 0800) Weight:  [70.1 kg (154 lb 8.7 oz)] 154 lb 8.7 oz (70.1 kg) (09/14 0400)    Intake/Output from previous day: 09/13 0701 - 09/14 0700 In: 3070 [P.O.:1200; I.V.:1810; IV Piggyback:60] Out: 1615 [Urine:1150; Emesis/NG output:400; Drains:65] Intake/Output this shift: Total I/O In: 270 [P.O.:120; I.V.:150] Out: 200 [Urine:200]  General appearance: alert, cooperative and no distress Resp: clear to auscultation bilaterally Cardio: regular rate and rhythm, S1, S2 normal, no murmur, click, rub or gallop Abdomen soft, flat. Minimal bowel sounds. Incisions healing appropriately without drainage. Jackson-Pratt drainage sanguinous in nature.  Lab Results:   [facility] 11/03/10 0504 11/02/10 0537  WBC 7.2 8.5  HGB 8.7* 8.2*  HCT 25.4* 23.6*  PLT 95* 73*   BMET  Basename 11/03/10 0504 11/02/10 0537  NA 140 138  K 3.6 3.5  CL 105 103  CO2 29 31  GLUCOSE 103* 94  BUN 11 18  CREATININE 1.45* 1.67*  CALCIUM 8.2* 7.5*   PT/INR No results found for this basename: LABPROT:2,INR:2 in the last 72 hours  Studies/Results: Chest Portable 1 View Post Insertion To Confirm Placement As Interpreted By Radiologist  11/01/2010  *RADIOLOGY REPORT*  Clinical Data: PICC line placement  PORTABLE CHEST - 1 VIEW  Comparison: Portable exam 1501 hours compared to 03/13/2010  Findings: Tip of right arm PICC line projects over right atrium. Rotated to left. Stable heart size and mediastinal contours. Atelectasis versus infiltrate left lower lobe. Minimal elevation right diaphragm, chronic. Remaining lungs clear.  Nasogastric tube extends into stomach.  IMPRESSION: Tip of right arm PICC line projects over the right atrium; recommend withdrawal 4 cm for positioning in SVC above cavoatrial junction. Atelectasis versus consolidation left lower lobe.  Original Report Authenticated By: Lollie Marrow, M.D.    Anti-infectives: Anti-infectives    None      Assessment/Plan: s/p Procedure(s): PARTIAL COLECTOMY ILEOSTOMY TAKEDOWN Impression: Anemia stable. Vital signs stable. Awaiting return of bowel function. Thrombocytopenia improving off Lovenox. Plan: Milk of magnesia prescribed. We'll transfer to regular floor on telemetry. Continue monitoring of blood work. Dr. Leticia Penna will be following the patient this weekend.  LOS: 4 days    Czar Ysaguirre A 11/03/2010

## 2010-11-03 NOTE — Progress Notes (Signed)
MEDICATION RELATED CONSULT NOTE - INITIAL   Pharmacy Consult for Calcium Replacement Indication: Hypocaclemia  Allergies  Allergen Reactions  . Ativan Other (See Comments)    Causes Hallucinations, makes "crazy"  . Percocet (Oxycodone-Acetaminophen) Itching and Nausea Only   Patient Measurements: Weight: 154 lb 8.7 oz (70.1 kg)  Vital Signs: Temp: 98.6 F (37 C) (09/14 0400) Temp src: Oral (09/14 0400) BP: 102/72 mmHg (09/14 0600) Intake/Output from previous day: 09/13 0701 - 09/14 0700 In: 3070 [P.O.:1200; I.V.:1810; IV Piggyback:60] Out: 1615 [Urine:1150; Emesis/NG output:400; Drains:65]  Labs:  [facility] 11/03/10 0504 11/02/10 0537 11/01/10 0442  WBC 7.2 8.5 15.9*  HGB 8.7* 8.2* 7.7*  HCT 25.4* 23.6* 21.9*  PLT 95* 73* 124*  APTT -- -- --  CREATININE 1.45* 1.67* --  LABCREA -- -- --  CREATININE 1.45* 1.67* --  CREAT24HRUR -- -- --  CRCLEARANCE -- -- --  MG 1.7 1.7 1.6  PHOS 2.5 2.8 5.0*  ALBUMIN 2.2* 2.2* --  PROT -- -- --  ALBUMIN 2.2* 2.2* --  AST -- -- --  ALT -- -- --  ALKPHOS -- -- --  BILITOT -- -- --  BILIDIR -- -- --  IBILI -- -- --   Calcium = 8.2 (7.5 yesterday)  Albumin = 2.2 CORRECTED CALCIUM = 9.6  Medical History: Past Medical History  Diagnosis Date  . Gout   . GERD (gastroesophageal reflux disease)   . Cirrhosis     ?ETOH related  . Colon cancer Dec 2011    Cecum  . ARF (acute renal failure)     hospitalization Jan 2012  . H/O ETOH abuse   . 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 S/P endoscopy Dec 2011    Schatzki's ring, Grade 1 esophageal varices, antral/body erosions  . Anemia of chronic disease 08/10/2010  . Anemia   . Renal insufficiency 10/24/2010  . Renal insufficiency 10/24/2010  . S/P right colectomy    Medications:  Scheduled:     . darbepoetin  100 mcg Subcutaneous Once  . ertapenem (INVANZ) IV  1 g Intravenous Q24H  . nystatin   Topical BID  . pantoprazole (PROTONIX)  IV  40 mg Intravenous Q24H  . sodium chloride  10 mL Intracatheter Q12H  . DISCONTD: enoxaparin  30 mg Subcutaneous Q24H   Assessment: CALCIUM CORRECTS due to low albumin Ionized calcium was slightly below goal yesterday  Goal of Therapy:  Corrected Calcium Level 8.4-10.5  Plan:  No additional IV calcium supplement today F/U AM labs. PO calcium supplement when able to take PO.   Valrie Hart A 11/03/2010,8:27 AM

## 2010-11-04 LAB — CBC
HCT: 25.4 % — ABNORMAL LOW (ref 39.0–52.0)
MCV: 91.4 fL (ref 78.0–100.0)
Platelets: 102 10*3/uL — ABNORMAL LOW (ref 150–400)
RBC: 2.78 MIL/uL — ABNORMAL LOW (ref 4.22–5.81)
WBC: 6.9 10*3/uL (ref 4.0–10.5)

## 2010-11-04 LAB — PHOSPHORUS: Phosphorus: 2.4 mg/dL (ref 2.3–4.6)

## 2010-11-04 LAB — BASIC METABOLIC PANEL
BUN: 10 mg/dL (ref 6–23)
CO2: 29 mEq/L (ref 19–32)
Chloride: 104 mEq/L (ref 96–112)
Creatinine, Ser: 1.23 mg/dL (ref 0.50–1.35)

## 2010-11-04 NOTE — Progress Notes (Signed)
Pt had a moderate size dark color stool.  No complain discomfort.

## 2010-11-04 NOTE — Progress Notes (Signed)
MEDICATION RELATED CONSULT NOTE - INITIAL   Pharmacy Consult for Calcium Replacement Indication: Hypocaclemia  Allergies  Allergen Reactions  . Ativan Other (See Comments)    Causes Hallucinations, makes "crazy"  . Percocet (Oxycodone-Acetaminophen) Itching and Nausea Only   Patient Measurements: Height: 5\' 10"  (177.8 cm) Weight: 154 lb 8.7 oz (70.1 kg) IBW/kg (Calculated) : 73   Vital Signs: Temp: 97.9 F (36.6 C) (09/15 0539) Temp src: Oral (09/15 0539) BP: 113/71 mmHg (09/15 0539) Pulse Rate: 89  (09/15 0539) Intake/Output from previous day: 09/14 0701 - 09/15 0700 In: 780 [P.O.:180; I.V.:600] Out: 680 [Urine:625; Drains:55]  Labs:  [facility] 11/04/10 0517 11/03/10 0504 11/02/10 0537  WBC 6.9 7.2 8.5  HGB 8.6* 8.7* 8.2*  HCT 25.4* 25.4* 23.6*  PLT 102* 95* 73*  APTT -- -- --  CREATININE 1.23 1.45* 1.67*  LABCREA -- -- --  CREATININE 1.23 1.45* 1.67*  CREAT24HRUR -- -- --  CRCLEARANCE -- -- --  MG 1.6 1.7 1.7  PHOS 2.4 2.5 2.8  ALBUMIN -- 2.2* 2.2*  PROT -- -- --  ALBUMIN -- 2.2* 2.2*  AST -- -- --  ALT -- -- --  ALKPHOS -- -- --  BILITOT -- -- --  BILIDIR -- -- --  IBILI -- -- --   Calcium = 8.2   Albumin = 2.2  CORRECTED CALCIUM = 9.6  Medical History: Past Medical History  Diagnosis Date  . Gout   . GERD (gastroesophageal reflux disease)   . Cirrhosis     ?ETOH related  . Colon cancer Dec 2011    Cecum  . ARF (acute renal failure)     hospitalization Jan 2012  . H/O ETOH abuse   . 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 S/P endoscopy Dec 2011    Schatzki's ring, Grade 1 esophageal varices, antral/body erosions  . Anemia of chronic disease 08/10/2010  . Anemia   . Renal insufficiency 10/24/2010  . Renal insufficiency 10/24/2010  . S/P right colectomy    Medications:  Scheduled:     . ertapenem (INVANZ) IV  1 g Intravenous Q24H  . feeding supplement  237 mL Oral TID BM  . nystatin   Topical BID   . pantoprazole (PROTONIX) IV  40 mg Intravenous Q24H  . sodium chloride  10 mL Intracatheter Q12H   Assessment: CALCIUM CORRECTS due to low albumin Ionized calcium was slightly below goal (expect improved)  Goal of Therapy:  Corrected Calcium Level 8.4-10.5  Plan:  No additional IV calcium supplement today F/U AM labs. PO calcium supplement when able to take PO.   Valrie Hart A 11/04/2010,8:22 AM

## 2010-11-04 NOTE — Progress Notes (Signed)
5 Days Post-Op  Subjective: Feels pretty good.  No nausea but some belching.  No flatus.  Pain fairly well controlled.  Out of bed to chair.  Objective: Vital signs in last 24 hours: Temp:  [97.5 F (36.4 C)-98.2 F (36.8 C)] 97.9 F (36.6 C) (09/15 0539) Pulse Rate:  [89-102] 89  (09/15 0539) Resp:  [16-20] 16  (09/15 0539) BP: (113-119)/(71-76) 113/71 mmHg (09/15 0539) SpO2:  [94 %-95 %] 95 % (09/15 0539) Last BM Date: 10/28/10  Intake/Output from previous day: 09/14 0701 - 09/15 0700 In: 780 [P.O.:180; I.V.:600] Out: 680 [Urine:625; Drains:55] Intake/Output this shift:    General appearance: alert and no distress GI: Abdomen soft, flat.  Expected tenderness.  No peritoneal signs.  Quiet, no sig bowel sounds.  JP erosang.  Lab Results:  @LABLAST2 (wbc:2,hgb:2,hct:2,plt:2) BMET  Basename 11/04/10 0517 11/03/10 0504  NA 137 140  K 3.9 3.6  CL 104 105  CO2 29 29  GLUCOSE 98 103*  BUN 10 11  CREATININE 1.23 1.45*  CALCIUM 8.2* 8.2*   PT/INR No results found for this basename: LABPROT:2,INR:2 in the last 72 hours ABG No results found for this basename: PHART:2,PCO2:2,PO2:2,HCO3:2 in the last 72 hours  Studies/Results: No results found.  Anti-infectives: Anti-infectives    None      Assessment/Plan: s/p Procedure(s): PARTIAL COLECTOMY ILEOSTOMY TAKEDOWN Await bowel function.  Continue increase activity.  Reassured patient and wife.  LOS: 5 days    Alayza Pieper C 11/04/2010

## 2010-11-05 MED ORDER — MUPIROCIN 2 % EX OINT
TOPICAL_OINTMENT | Freq: Two times a day (BID) | CUTANEOUS | Status: DC | PRN
Start: 2010-11-05 — End: 2010-11-07
  Administered 2010-11-05 – 2010-11-07 (×2): via TOPICAL
  Filled 2010-11-05: qty 22

## 2010-11-05 NOTE — Progress Notes (Signed)
6 Days Post-Op  Subjective: Pain better.  +BM.  Tolerating clears.  No nausea.  No emesis.  No fevers or chills.  Some drainage from around JP.  Objective: Vital signs in last 24 hours: Temp:  [97.5 F (36.4 C)-98.5 F (36.9 C)] 98.5 F (36.9 C) (09/16 0512) Pulse Rate:  [96-118] 96  (09/16 0512) Resp:  [16-20] 20  (09/16 0512) BP: (108-116)/(73-79) 108/73 mmHg (09/16 0512) SpO2:  [93 %-97 %] 97 % (09/16 0512) Last BM Date: 11/04/10  Intake/Output from previous day: 09/15 0701 - 09/16 0700 In: 1620 [P.O.:720; I.V.:900] Out: 270 [Urine:200; Drains:70] Intake/Output this shift:    General appearance: alert and no distress Resp: clear to auscultation bilaterally GI: soft, flat, moderate anticipated tenderness.  Inc c/d/i.  Slight serous d/c at base staples.  Some serosang d/c around L JP.  Both JP's serosang, scant outpt.  Lab Results:  @LABLAST2 (wbc:2,hgb:2,hct:2,plt:2) BMET  Basename 11/04/10 0517 11/03/10 0504  NA 137 140  K 3.9 3.6  CL 104 105  CO2 29 29  GLUCOSE 98 103*  BUN 10 11  CREATININE 1.23 1.45*  CALCIUM 8.2* 8.2*   PT/INR No results found for this basename: LABPROT:2,INR:2 in the last 72 hours ABG No results found for this basename: PHART:2,PCO2:2,PO2:2,HCO3:2 in the last 72 hours  Studies/Results: No results found.  Anti-infectives: Anti-infectives    None      Assessment/Plan: s/p Procedure(s): PARTIAL COLECTOMY ILEOSTOMY TAKEDOWN Doing well.  Advance diet.  Hep well IV.  Increase activity.    LOS: 6 days    Lezlie Ritchey C 11/05/2010

## 2010-11-05 NOTE — Progress Notes (Addendum)
Telephoned MD regarding drainage around left JP, MD will assess upon his arrival.NO new orders written. Will continue to monitor.

## 2010-11-06 LAB — CBC
HCT: 26 % — ABNORMAL LOW (ref 39.0–52.0)
Hemoglobin: 8.7 g/dL — ABNORMAL LOW (ref 13.0–17.0)
MCV: 92.2 fL (ref 78.0–100.0)
RBC: 2.82 MIL/uL — ABNORMAL LOW (ref 4.22–5.81)
RDW: 16.5 % — ABNORMAL HIGH (ref 11.5–15.5)
WBC: 8.1 10*3/uL (ref 4.0–10.5)

## 2010-11-06 LAB — BASIC METABOLIC PANEL
BUN: 9 mg/dL (ref 6–23)
CO2: 27 mEq/L (ref 19–32)
Chloride: 103 mEq/L (ref 96–112)
GFR calc Af Amer: >60 mL/min (ref >60)
Glucose, Bld: 148 mg/dL — ABNORMAL HIGH (ref 70–99)
Potassium: 3.8 mEq/L (ref 3.5–5.1)

## 2010-11-06 MED ORDER — PANTOPRAZOLE SODIUM 40 MG PO TBEC
40.0000 mg | DELAYED_RELEASE_TABLET | Freq: Every day | ORAL | Status: DC
  Administered 2010-11-06: 40 mg via ORAL
  Filled 2010-11-06 (×2): qty 1

## 2010-11-06 MED ORDER — SODIUM CHLORIDE 0.9 % IJ SOLN
INTRAMUSCULAR | Status: AC
  Administered 2010-11-07: 10 mL
  Filled 2010-11-06: qty 10

## 2010-11-06 NOTE — Progress Notes (Signed)
7 Days Post-Op  Subjective: Progressing well. By mouth intake improving. Still ambulating with some difficulty.  Objective: Vital signs in last 24 hours: Temp:  [98 F (36.7 C)-98.5 F (36.9 C)] 98.1 F (36.7 C) (09/17 0549) Pulse Rate:  [99-117] 100  (09/17 0549) Resp:  [16-18] 16  (09/17 0549) BP: (99-110)/(66-71) 100/67 mmHg (09/17 0549) SpO2:  [94 %-96 %] 95 % (09/17 0549) Last BM Date: 11/06/10  Intake/Output from previous day: 09/16 0701 - 09/17 0700 In: 1040 [P.O.:1040] Out: 60 [Drains:60] Intake/Output this shift:    General appearance: alert, cooperative and fatigued Resp: clear to auscultation bilaterally Cardio: regular rate and rhythm, S1, S2 normal, no murmur, click, rub or gallop Abdomen: Soft, flat. Positive bowel sounds. Incisions healing well. Right Jackson-Pratt drain removed. Left Jackson-Pratt drain with some bloody discharge.  Lab Results:   [facility] 11/04/10 0517  WBC 6.9  HGB 8.6*  HCT 25.4*  PLT 102*   BMET  Basename 11/04/10 0517  NA 137  K 3.9  CL 104  CO2 29  GLUCOSE 98  BUN 10  CREATININE 1.23  CALCIUM 8.2*   PT/INR No results found for this basename: LABPROT:2,INR:2 in the last 72 hours  Studies/Results: No results found.  Anti-infectives: Anti-infectives     Start     Dose/Rate Route Frequency Ordered Stop   10/30/10 1700   ertapenem (INVANZ) 1 g in sodium chloride 0.9 % 50 mL IVPB  Status:  Discontinued        1 g 100 mL/hr over 30 Minutes Intravenous Every 24 hours 10/30/10 1652 11/05/10 1314          Assessment/Plan: s/p Procedure(s): PARTIAL COLECTOMY ILEOSTOMY TAKEDOWN Impression: Diet advancing slowly but well. Is starting to transfer better to the bedside commode. Plan: Will check labs today. Hopefully will be discharged in the next 24-48 hours with home health assistance.  LOS: 7 days    Peter [name] A 11/06/2010

## 2010-11-07 ENCOUNTER — Encounter (HOSPITAL_COMMUNITY): Payer: Self-pay

## 2010-11-07 MED ORDER — POTASSIUM CHLORIDE CRYS ER 20 MEQ PO TBCR: 20.0000 meq | EXTENDED_RELEASE_TABLET | Freq: Every day | ORAL | Status: DC

## 2010-11-07 MED ORDER — DARBEPOETIN ALFA-POLYSORBATE 100 MCG/0.5ML IJ SOLN
100.0000 ug | Freq: Once | INTRAMUSCULAR | Status: DC
  Filled 2010-11-07: qty 0.5

## 2010-11-07 MED ORDER — HEPARIN SOD (PORK) LOCK FLUSH 100 UNIT/ML IV SOLN
500.0000 [IU] | Freq: Once | INTRAVENOUS | Status: AC
  Administered 2010-11-07: 1000 [IU] via INTRAVENOUS
  Filled 2010-11-07: qty 5

## 2010-11-07 NOTE — Discharge Summary (Signed)
Physician Discharge Summary  Patient ID: Peter [name] MRN: [id number] DOB/AGE: 1950-12-17 60 y.o.  Admit date: 10/30/2010 Discharge date: 11/07/2010  Admission Diagnoses: Status post [name] Hartman's procedure with ileostomy, renal insufficiency, chronic anemia Discharge Diagnoses: Reversal of Hartman's procedure, chronic anemia Active Problems:  * No active hospital problems. *    Discharged Condition: good  Hospital Course: Patient is [name] 60 year old white male status post [name] Hartman's procedure with ileostomy do to an anastomotic leak earlier this year. He has suffered from renal insufficiency and chronic anemia. He presented for reversal of his Hartman's procedure. This was performed on 10/30/2010. He tolerated the procedure remarkably well. He did require multiple blood transfusions. He did receive Aranesp for treatment of the anemia. Remarkably, his renal insufficiency resolved. His diet was advanced without difficulty once his bowel function returned. He was discharged home in good improving condition.  Consults: none  Significant Diagnostic Studies: None  Treatments: surgery: Reversal of Hartman's procedure  Discharge Exam: Blood pressure 98/65, pulse 100, temperature 98.7 F (37.1 C), temperature source Oral, resp. rate 18, height 5\' 10"  (1.778 m), weight 70.1 kg (154 lb 8.7 oz), SpO2 94.00%. General appearance: alert, cooperative and no distress Resp: clear to auscultation bilaterally Cardio: regular rate and rhythm, S1, S2 normal, no murmur, click, rub or gallop Abdomen: Soft, flat. [name] midline incision healing well. Skin excoriation around the old ileostomy site improving. No drainage noted.  Disposition: Home, with home health nurse.  Discharge Orders    Future Appointments: Provider: Department: Dept Phone: Center:   12/25/2010 11:30 AM Peter Anes, PA [facility] [phone] None     Future Orders Please Complete By Expires   SCHEDULING COMMUNICATION INJECTION        Comments:   Injection Appt 30 min.      Discharge Medication List as of 11/07/2010 12:09 PM    CONTINUE these medications which have CHANGED   Details  potassium chloride SA (KLOR-CON M20) 20 MEQ tablet Take 1 tablet (20 mEq total) by mouth daily., Starting 11/07/2010, Until Wed 11/07/11, Normal      CONTINUE these medications which have NOT CHANGED   Details  ALPRAZolam (XANAX) 0.25 MG tablet Take 0.25 mg by mouth 2 (two) times daily as needed. For anxiety  , Until Discontinued, Historical Med    diphenhydrAMINE (BENADRYL) 25 mg capsule Take 25 mg by mouth every 6 (six) hours as needed. For itching , Until Discontinued, Historical Med    diphenoxylate-atropine (LOMOTIL) 2.5-0.025 MG per tablet Take 1 tablet by mouth 4 (four) times daily as needed for diarrhea/loose stools., Starting 10/19/2010, Until Fri 10/19/11, Print    feeding supplement (PRO-STAT SUGAR FREE 64) LIQD Take 30 mLs by mouth 3 (three) times daily with meals.  , Until Discontinued, Historical Med    Ginger, Zingiber officinalis, (GINGER PO) Take 1 tablet by mouth daily. As needed for nausea , Until Discontinued, Historical Med    HYDROcodone-acetaminophen (NORCO) 5-325 MG per tablet Take 1 tablet by mouth every 6 (six) hours as needed. For pain, Until Discontinued, Historical Med    MILK THISTLE PO Take 240 mg by mouth daily as needed. , Until Discontinued, Historical Med    Multiple Vitamin (MULTIVITAMIN) tablet Take 1 tablet by mouth daily.  , Until Discontinued, Historical Med    pantoprazole (PROTONIX) 40 MG tablet Take 40 mg by mouth 2 (two) times daily. , Until Discontinued, Historical Med    promethazine (PHENERGAN) 25 MG tablet Take 25 mg by mouth every 4 (four)  hours as needed.  , Until Discontinued, Historical Med    cholestyramine light (PREVALITE) 4 G packet Take 4 g by mouth 3 (three) times daily.  , Until Discontinued, Historical Med    loperamide (IMODIUM) 2 MG capsule Take 2 mg by mouth 4 (four)  times daily as needed.  , Until Discontinued, Historical Med      STOP taking these medications     epoetin alfa (EPOGEN,PROCRIT) 81191 UNIT/ML injection      Invert Sugar-Sodium Chloride (TRAVERT/NORMAL SALINE IV)      megestrol ([name]) 40 MG tablet        Follow-up Information    Follow up with Peter [name] on 11/14/2010.   Contact information:   [address] [place] [address] [phone]         home health nurse to check B. met in CBC on 11/13/2010.  SignedFranky Macho [name] 11/07/2010, 5:55 PM

## 2010-12-13 ENCOUNTER — Encounter: Payer: Self-pay | Admitting: Gastroenterology

## 2010-12-13 ENCOUNTER — Ambulatory Visit (INDEPENDENT_AMBULATORY_CARE_PROVIDER_SITE_OTHER): Payer: Medicaid Other | Admitting: Gastroenterology

## 2010-12-13 DIAGNOSIS — R197 Diarrhea, unspecified: Secondary | ICD-10-CM

## 2010-12-13 DIAGNOSIS — K7460 Unspecified cirrhosis of liver: Secondary | ICD-10-CM

## 2010-12-13 DIAGNOSIS — C189 Malignant neoplasm of colon, unspecified: Secondary | ICD-10-CM

## 2010-12-13 DIAGNOSIS — D638 Anemia in other chronic diseases classified elsewhere: Secondary | ICD-10-CM

## 2010-12-13 DIAGNOSIS — R634 Abnormal weight loss: Secondary | ICD-10-CM

## 2010-12-13 MED ORDER — RESTORA PO CAPS: 1.0000 | ORAL_CAPSULE | Freq: Every day | ORAL | Status: DC

## 2010-12-13 NOTE — Assessment & Plan Note (Signed)
Due for surveillance colonoscopy January 2013. Patient has had multiple complications as outlined in past surgical history. His diarrhea has actually improved since his ileostomy was reversed. We can get him down to less stools daily if he would take Imodium. Since his reversal he has not used Questran, Lomotil, or Imodium and averages 4-6 stools daily. Recommended he take Imodium 2 mg every morning. May take up to 3-4 times daily as needed but he needs to hold for constipation.  He has not been able to gain a significant amount of weight. Hopefully this will improve now that he's had reversal of the ostomy and has less diarrhea. We'll check fasting cortisol level and thyroid function.  Add probiotic, Restora, 1 daily #30 samples provided.

## 2010-12-13 NOTE — Patient Instructions (Addendum)
Please have your blood work done. Please take Restora one daily for four weeks. Samples provided. Please take imodium up to four times daily as needed. Hold for constipation. Continue to follow up with Dr. Lovell Sheehan regarding wound care.

## 2010-12-13 NOTE — Progress Notes (Signed)
Addended by: Tiffany Kocher on: 12/13/2010 01:35 PM   Modules accepted: Orders

## 2010-12-13 NOTE — Progress Notes (Signed)
Primary Care Physician:  [name], MD  Primary Gastroenterologist:  Roetta Sessions, MD   Chief Complaint  Patient presents with  . Diarrhea    still have some    HPI:  Peter [name] is a 60 y.o. male here for followup of diarrhea. Since his last office visit in August of 2012, he underwent reversal of ileostomy. The diarrhea has improved somewhat. Last two Sunday's some diarrhea. If get up and go riding doesn't have to go numerous bowel movements. BM usually 4-6 times daily. Currently not on questran or lomotil. This morning little more formed. No melena, brbpr. Lots of gas. Appetite less now since bag removed. No vomiting. Weight is up 8 pain based on our scales but he does have heavier clothes on today. Some faint nausea. Next appt with Dr. Mariel Sleet, Nov 5th. Continues to see Dr. Mariel Sleet regarding anemia. Denies heartburn. Denies dysphagia. He is consuming one nutrient supplement daily with his meals. States his blood pressure has been better with decreased stools. Still feeling really tired which he contributes to the fact that he can't seem to gain any weight. He has been having some drainage from her prior percutaneous tube site as well as from his incision related to recent ileostomy takedown. He has been following with Dr. Lovell Sheehan, to complete a course of Bactrim today.   Current Outpatient Prescriptions  Medication Sig Dispense Refill  . ALPRAZolam (XANAX) 0.25 MG tablet Take 0.25 mg by mouth 2 (two) times daily as needed. For anxiety        . diphenhydrAMINE (BENADRYL) 25 mg capsule Take 25 mg by mouth every 6 (six) hours as needed. For itching       . feeding supplement (PRO-STAT SUGAR FREE 64) LIQD Take 30 mLs by mouth 3 (three) times daily with meals.        Marland Kitchen HYDROcodone-acetaminophen (NORCO) 5-325 MG per tablet Take 1 tablet by mouth every 6 (six) hours as needed. For pain      . loperamide (IMODIUM) 2 MG capsule Take 2 mg by mouth 4 (four) times daily as needed.          . Multiple Vitamin (MULTIVITAMIN) tablet Take 1 tablet by mouth daily.        . pantoprazole (PROTONIX) 40 MG tablet Take 40 mg by mouth 2 (two) times daily.       . potassium chloride SA (KLOR-CON M20) 20 MEQ tablet Take 1 tablet (20 mEq total) by mouth daily.  60 tablet  2  . promethazine (PHENERGAN) 25 MG tablet Take 25 mg by mouth every 4 (four) hours as needed.        . Probiotic Product (RESTORA) CAPS Take 1 capsule by mouth daily.  30 capsule  0    Allergies as of 12/13/2010 - Review Complete 12/13/2010  Allergen Reaction Noted  . Ativan Other (See Comments) 08/01/2010  . Percocet (oxycodone-acetaminophen) Itching and Nausea Only 08/01/2010    Past Medical History  Diagnosis Date  . Gout   . GERD (gastroesophageal reflux disease)   . Cirrhosis     ?ETOH related  . Colon cancer Dec 2011    Cecum  . ARF (acute renal failure)     hospitalization Jan 2012  . H/O ETOH abuse   . 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 S/P endoscopy Dec 2011    Schatzki's ring, Grade 1 esophageal varices, antral/body erosions  . Anemia of chronic disease 08/10/2010  .  Renal insufficiency 10/24/2010    Past Surgical History  Procedure Date  . Colon surgery 02/24/2010    colon cancer (cecum)  . Exploratory laparotomy 03/03/2010    anastomotic leak, developed EC fistula  . Exploratory laparotomy w/ bowel resection 04/18/2010    ileostomy placed, (hx of EC fistula, anastomotic leak). About two feet of ileum removed.  . Colostomy   . Percutaneous drainage of intraabdominal abscess 07/2010 and 09/2010  . Partial colectomy 10/30/2010    Procedure: PARTIAL COLECTOMY;  Surgeon: Dalia Heading;  Location: [facility];  Service: General;  Laterality: N/A;  . Ileostomy closure 10/30/2010    Procedure: ILEOSTOMY TAKEDOWN;  Surgeon: Dalia Heading;  Location: [facility];  Service: General;  Laterality: N/A;    Family History  Problem Relation Age of Onset  . Cirrhosis  Father     deceased, secondary to ETOH    History   Social History  . Marital Status: Married    Spouse Name: N/A    Number of Children: N/A  . Years of Education: N/A   Occupational History  . Not on file.   Social History Main Topics  . Smoking status: Never Smoker   . Smokeless tobacco: Not on file  . Alcohol Use: No     in the past  . Drug Use: No  . Sexually Active: Not on file   Other Topics Concern  . Not on file   Social History Narrative  . No narrative on file      ROS:  General: Negative for fever, chills. See history of present illness. Eyes: Negative for vision changes.  ENT: Negative for hoarseness, difficulty swallowing , nasal congestion. CV: Negative for chest pain, angina, palpitations, dyspnea on exertion, peripheral edema.  Respiratory: Negative for dyspnea at rest, dyspnea on exertion, cough, sputum, wheezing.  GI: See history of present illness. GU:  Negative for dysuria, hematuria, urinary incontinence, urinary frequency, nocturnal urination.  MS: Negative for joint pain, low back pain.  Derm: Negative for rash or itching.  Neuro: Negative for weakness, abnormal sensation, seizure, frequent headaches, memory loss, confusion.  Psych: Negative for anxiety, depression, suicidal ideation, hallucinations.  Endo: See history of present illness.   Heme: Negative for bruising or bleeding. Allergy: Negative for rash or hives.    Physical Examination:  BP 110/70  Pulse 104  Temp(Src) 97.4 F (36.3 C) (Temporal)  Ht 5\' 10"  (1.778 m)  Wt 129 lb 3.2 oz (58.605 kg)  BMI 18.54 kg/m2   General: Thin Caucasian gentleman, chronically ill-appearing in no acute distress.  Head: Normocephalic, atraumatic.   Eyes: Conjunctiva pink, no icterus. Mouth: Oropharyngeal mucosa moist and pink , no lesions erythema or exudate. Neck: Supple without thyromegaly, masses, or lymphadenopathy.  Lungs: Clear to auscultation bilaterally.  Heart: Regular rate and  rhythm, no murmurs rubs or gallops.  Abdomen: Bowel sounds are normal, diffuse mild tenderness, nondistended, no hepatosplenomegaly or masses, no abdominal bruits or    hernia , no rebound or guarding.  He L. lobe drainage in the left abdomen from prior percutaneous tube site, similar drainage at low anterior incision. Ostomy site looks good. Rectal: Not performed. Extremities: No lower extremity edema. No clubbing or deformities.  Neuro: Alert and oriented x 4 , grossly normal neurologically.  Skin: Warm and dry, no rash or jaundice.   Psych: Alert and cooperative, normal mood and affect.  Labs: Lab Results  Component Value Date   WBC 8.1 11/06/2010   HGB 8.7* 11/06/2010  HCT 26.0* 11/06/2010   MCV 92.2 11/06/2010   PLT 128* 11/06/2010   Lab Results  Component Value Date   CREATININE 1.17 11/06/2010   BUN 9 11/06/2010   NA 136 11/06/2010   K 3.8 11/06/2010   CL 103 11/06/2010   CO2 27 11/06/2010   Lab Results  Component Value Date   ALT 16 10/31/2010   AST 24 10/31/2010   ALKPHOS 85 10/31/2010   BILITOT 1.1 10/31/2010   Lab Results  Component Value Date   IRON 68 07/31/2010   TIBC 265 07/31/2010   FERRITIN 1311* 07/31/2010   Lab Results  Component Value Date   INR 1.31 10/02/2010   INR 1.28 08/01/2010   INR 1.26 03/18/2010     Imaging Studies: No results found.

## 2010-12-13 NOTE — Assessment & Plan Note (Signed)
Management and workup of cirrhosis delayed by diagnosis of colon cancer back in December 2011. He had a mildly elevated alpha-fetoprotein tumor marker and is due for followup. Previous hepatitis B and C. markers were negative. We will check immune status to hepatitis A and B. Previously his ferritin was abnormally elevated but this is in the setting of multiple surgeries/infection. Presumed cirrhosis due to previous alcohol abuse.

## 2010-12-13 NOTE — Assessment & Plan Note (Signed)
Persistent anemia. Patient receives Procrit through Dr.Neijstrom's office. Complains of feeling a little more weak than usual. No overt GI bleeding. Check a CBC at this point, he is supposed to be having this done monthly.

## 2010-12-14 NOTE — Progress Notes (Signed)
Cc to PCP 

## 2010-12-16 LAB — CBC WITH DIFFERENTIAL/PLATELET
Eosinophils Absolute: 0.1 10*3/uL (ref 0.0–0.7)
Eosinophils Relative: 1 % (ref 0–5)
HCT: 29.1 % — ABNORMAL LOW (ref 39.0–52.0)
Hemoglobin: 9.4 g/dL — ABNORMAL LOW (ref 13.0–17.0)
Lymphocytes Relative: 16 % (ref 12–46)
Lymphs Abs: 1.3 10*3/uL (ref 0.7–4.0)
MCH: 29.7 pg (ref 26.0–34.0)
MCV: 92.1 fL (ref 78.0–100.0)
Monocytes Absolute: 0.6 10*3/uL (ref 0.1–1.0)
Monocytes Relative: 7 % (ref 3–12)
Platelets: 282 10*3/uL (ref 150–400)
RBC: 3.16 MIL/uL — ABNORMAL LOW (ref 4.22–5.81)
WBC: 8.6 10*3/uL (ref 4.0–10.5)

## 2010-12-16 LAB — HEPATITIS B SURFACE ANTIBODY,QUALITATIVE: Hep B S Ab: NEGATIVE

## 2010-12-16 LAB — HEPATITIS A ANTIBODY, TOTAL: Hep A Total Ab: NEGATIVE

## 2010-12-18 ENCOUNTER — Other Ambulatory Visit (HOSPITAL_COMMUNITY): Payer: Self-pay | Admitting: General Surgery

## 2010-12-18 DIAGNOSIS — K651 Peritoneal abscess: Secondary | ICD-10-CM

## 2010-12-19 ENCOUNTER — Telehealth: Payer: Self-pay | Admitting: Internal Medicine

## 2010-12-19 ENCOUNTER — Ambulatory Visit (HOSPITAL_COMMUNITY)
Admission: RE | Admit: 2010-12-19 | Discharge: 2010-12-19 | Disposition: A | Discharge status: Home / Self Care | Payer: Medicaid Other | Source: Ambulatory Visit | Attending: General Surgery | Admitting: General Surgery

## 2010-12-19 DIAGNOSIS — K651 Peritoneal abscess: Secondary | ICD-10-CM

## 2010-12-19 NOTE — Telephone Encounter (Signed)
Wants Randys lab results call around 4 pm

## 2010-12-19 NOTE — Telephone Encounter (Signed)
Routed to LSL 

## 2010-12-19 NOTE — Progress Notes (Signed)
Quick Note:  Please let pt know his tsh and cortisol are normal. CBC stable, continue to follow with Dr. Thornton Papas office.  AFP better, now normal. He is not immune to Hepatitis A or B. I would recommend he be vaccinated sometime in 2013 once he has fully recovered from his CRC surgeries etc. He can have this done with PCP or health dept. Please make sure he is NIC for TCS in 02/2011.  Repeat LFTs, INR, AFP, CBC, ferritin in 6 months. ______

## 2010-12-20 ENCOUNTER — Encounter (HOSPITAL_COMMUNITY): Payer: Self-pay

## 2010-12-20 ENCOUNTER — Ambulatory Visit (HOSPITAL_COMMUNITY)
Admission: RE | Admit: 2010-12-20 | Discharge: 2010-12-20 | Disposition: A | Discharge status: Home / Self Care | Payer: Medicaid Other | Source: Ambulatory Visit | Attending: General Surgery | Admitting: General Surgery

## 2010-12-20 DIAGNOSIS — F1020 Alcohol dependence, uncomplicated: Secondary | ICD-10-CM | POA: Insufficient documentation

## 2010-12-20 DIAGNOSIS — K7030 Alcoholic cirrhosis of liver without ascites: Secondary | ICD-10-CM | POA: Insufficient documentation

## 2010-12-20 DIAGNOSIS — Z85038 Personal history of other malignant neoplasm of large intestine: Secondary | ICD-10-CM | POA: Insufficient documentation

## 2010-12-20 DIAGNOSIS — Z980 Intestinal bypass and anastomosis status: Secondary | ICD-10-CM | POA: Insufficient documentation

## 2010-12-20 DIAGNOSIS — Z9049 Acquired absence of other specified parts of digestive tract: Secondary | ICD-10-CM | POA: Insufficient documentation

## 2010-12-20 DIAGNOSIS — K630 Abscess of intestine: Secondary | ICD-10-CM | POA: Insufficient documentation

## 2010-12-20 DIAGNOSIS — R933 Abnormal findings on diagnostic imaging of other parts of digestive tract: Secondary | ICD-10-CM | POA: Insufficient documentation

## 2010-12-20 MED ORDER — IOHEXOL 300 MG/ML  SOLN
50.0000 mL | Freq: Once | INTRAMUSCULAR | Status: AC | PRN
  Administered 2010-12-20: 50 mL via INTRAVENOUS

## 2010-12-20 NOTE — Progress Notes (Signed)
Quick Note:  Tried to call pt- LMOM ______ 

## 2010-12-21 ENCOUNTER — Other Ambulatory Visit: Payer: Self-pay | Admitting: Gastroenterology

## 2010-12-21 DIAGNOSIS — C189 Malignant neoplasm of colon, unspecified: Secondary | ICD-10-CM

## 2010-12-25 ENCOUNTER — Encounter (HOSPITAL_COMMUNITY): Payer: Medicaid Other | Attending: Oncology | Admitting: Oncology

## 2010-12-25 VITALS — BP 115/78 | HR 83 | Wt 130.8 lb

## 2010-12-25 DIAGNOSIS — D638 Anemia in other chronic diseases classified elsewhere: Secondary | ICD-10-CM

## 2010-12-25 DIAGNOSIS — C189 Malignant neoplasm of colon, unspecified: Secondary | ICD-10-CM

## 2010-12-25 NOTE — Patient Instructions (Signed)
[facility]  Discharge Instructions  RECOMMENDATIONS MADE BY THE CONSULTANT AND ANY TEST RESULTS WILL BE SENT TO YOUR REFERRING DOCTOR.   EXAM FINDINGS BY MD TODAY AND SIGNS AND SYMPTOMS TO REPORT TO CLINIC OR PRIMARY MD: Elijah Birk will call Dr. Lovell Sheehan today to discuss CT scans.    MEDICATIONS PRESCRIBED: none      SPECIAL INSTRUCTIONS/FOLLOW-UP: Lab work Needed in 3 months and Return to Clinic on after labs in 3 months.   I acknowledge that I have been informed and understand all the instructions given to me and received a copy. I do not have any more questions at this time, but understand that I may call the Specialty Clinic at [facility] at [phone] during business hours should I have any further questions or need assistance in obtaining follow-up care.    __________________________________________  _____________  __________ Signature of Patient or Authorized Representative            Date                   Time    __________________________________________ Nurse's Signature

## 2010-12-25 NOTE — Progress Notes (Signed)
Peter Reichert, MD [address] [place] [address]  1. Colon cancer  metroNIDAZOLE (FLAGYL) 500 MG tablet, sulfamethoxazole-trimethoprim (BACTRIM DS) 800-160 MG per tablet, diphenoxylate-atropine (LOMOTIL) 2.5-0.025 MG per tablet, CBC, Differential, CEA    CURRENT THERAPY:S/P resection by Dr. Lovell Sheehan in January 2012  INTERVAL HISTORY: Peter [name] 60 y.o. male returns for  regular  visit for followup of Stage I Colon cancer and anemia of chronic disease.  The patient is recently S/P partial colectomy with ileostomy takedown by Dr. Lovell Sheehan.  The patient reports that the surgery went well, however, the patient became infected 6 weeks following the operation.  As a result, he is on 2 oral antibiotics by Dr. Lovell Sheehan.  The patient is due to see Dr. Lovell Sheehan on Thursday for follow-up.  I personally reviewed and went over radiographic studies with the patient.  The patient's CT scan reveals a number of reactively enlarged lymph nodes.  Of note, there is a peritoneal nodule that is appreciated that is new.  I spoke with Dr. Lovell Sheehan regarding this finding and he reports that this area looked clear and clean during the operation.  As a result, he thinks it may be secondary to the infection and/or an over-read by radiologist.  Therefore, we will follow the nodule via follow-up CT scan.  Dr. Lovell Sheehan reports that he will order follow-up CT scan and so I will not order one at this point in time as to not confuse the surveillance of this nodule.  I personally reviewed and went over laboratory results with the patient.  Otherwise, the patient denies any complaints.  We went over the CT results and he understands the findings.    Past Medical History  Diagnosis Date  . Gout   . GERD (gastroesophageal reflux disease)   . Cirrhosis     ?ETOH related  . Colon cancer Dec 2011    Cecum  . ARF (acute renal failure)     hospitalization Jan 2012  . H/O ETOH abuse   . S/P colonoscopy Dec 2011   cecal mass, tubulovillous adenoma at splenic flexure  . S/P endoscopy Dec 2011    Schatzki's ring, Grade 1 esophageal varices, antral/body erosions  . Anemia of chronic disease 08/10/2010  . Renal insufficiency 10/24/2010    has CIRRHOSIS; EPIGASTRIC PAIN; TRANSAMINASES, SERUM, ELEVATED; Personal history of colon cancer; Dehydration; Colon cancer; Anemia of chronic disease; Diarrhea; Weight loss, abnormal; and Renal insufficiency on his problem list.     is allergic to ativan and percocet.  Mr. Edelson does not currently have medications on file.  Past Surgical History  Procedure Date  . Colon surgery 02/24/2010    colon cancer (cecum)  . Exploratory laparotomy 03/03/2010    anastomotic leak, developed EC fistula  . Exploratory laparotomy w/ bowel resection 04/18/2010    ileostomy placed, (hx of EC fistula, anastomotic leak). About two feet of ileum removed.  . Colostomy   . Percutaneous drainage of intraabdominal abscess 07/2010 and 09/2010  . Partial colectomy 10/30/2010    Procedure: PARTIAL COLECTOMY;  Surgeon: Dalia Heading;  Location: [facility];  Service: General;  Laterality: N/A;  . Ileostomy closure 10/30/2010    Procedure: ILEOSTOMY TAKEDOWN;  Surgeon: Dalia Heading;  Location: [facility];  Service: General;  Laterality: N/A;    Denies any headaches, dizziness, double vision, fevers, chills, night sweats, nausea, vomiting, diarrhea, constipation, chest pain, heart palpitations, shortness of breath, blood in stool, black tarry stool, urinary pain, urinary burning, urinary frequency,  hematuria.   PHYSICAL EXAMINATION  ECOG PERFORMANCE STATUS: 2 - Symptomatic, <50% confined to bed  Filed Vitals:   12/25/10 1139  BP: 115/78  Pulse: 83    GENERAL:alert, no distress, comfortable, cooperative and smiling SKIN: skin color, texture, turgor are normal HEAD: Normocephalic EYES: normal EARS: External ears normal OROPHARYNX:mucous membranes are moist  NECK: supple, trachea  midline LYMPH:  no palpable lymphadenopathy BREAST:not examined LUNGS: clear to auscultation  HEART: regular rate & rhythm, no murmurs, no gallops, S1 normal and S2 normal ABDOMEN:abdomen soft, non-tender, normal bowel sounds and clean healing incision sites from partial colectomy with ileostomy takedown.  Clean gauze noted covering some sites, but others are open to the air and appear very clean and not infected. BACK: Back symmetric, no curvature. EXTREMITIES:less then 2 second capillary refill, no joint deformities, effusion, or inflammation, no edema, no skin discoloration, no clubbing, no cyanosis  NEURO: alert & oriented x 3 with fluent speech, no focal motor/sensory deficits, gait normal, utilizing a cane   LABORATORY DATA: CBC    Component Value Date/Time   WBC 8.6 12/15/2010 0740   RBC 3.16* 12/15/2010 0740   HGB 9.4* 12/15/2010 0740   HCT 29.1* 12/15/2010 0740   PLT 282 12/15/2010 0740   MCV 92.1 12/15/2010 0740   MCH 29.7 12/15/2010 0740   MCHC 32.3 12/15/2010 0740   RDW 16.8* 12/15/2010 0740   LYMPHSABS 1.3 12/15/2010 0740   MONOABS 0.6 12/15/2010 0740   EOSABS 0.1 12/15/2010 0740   BASOSABS 0.0 12/15/2010 0740    Lab Results  Component Value Date   CEA 1.2 10/24/2010       RADIOGRAPHIC STUDIES:  12/20/10  *RADIOLOGY REPORT*  Clinical Data: Colon cancer with colostomy placed 01/12. Reversal  of colostomy 09/12. Ethanol abuse and cirrhosis. Evaluate  abscess.  CT ABDOMEN AND PELVIS WITH CONTRAST  Technique: Multidetector CT imaging of the abdomen and pelvis was  performed following the standard protocol during bolus  administration of intravenous contrast.  Contrast: 50mL OMNIPAQUE IOHEXOL 300 MG/ML IV SOLN  Comparison: 10/04/2010  Findings: Clear lung bases. Coronary artery atherosclerosis in the  LAD on image 2. Trace right-sided pleural fluid which is decreased  since 10/04/2010. The right retrocrural node measures 7 mm on  image 13 versus 6 mm on  09/28/2010.  Mild cirrhosis. Limited evaluation for focal liver lesion,  secondary to low contrast dosage. Normal spleen, stomach. Mild  pancreatic atrophy. Normal gallbladder, biliary tract, adrenal  glands.  Normal kidneys. Mildly prominent retroperitoneal lymph nodes.  None pathologic by size criteria. Index para-aortic node measures  8 mm on image 41 versus 6 mm on the prior.  Status post sigmoid too small bowel anastomosis, new in the  interval. Persistent defect within the lateral aspect of the  distal-most portion of the surgical suture line. Example image 60  of series 2. This is at the site of contrast tract on the  10/04/2010 study.  This tracks to and ill-defined fluid and gas collection positioned  anterolateral the left psoas muscle. 3.7 x 4.7 cm on image 63.  3.1 x 2.6 cm on 09/28/2010. Gas is seen inferomedial to this  collection on image 69, extraluminal. There is also extension of  the gas into the adjacent anterior left pelvic wall ( at the site  of prior surgical drain, per discussion with Dr. Lovell Sheehan). Image  63 of series 2.  Small bowel loops measure up to 3.8 cm. No focal transition  point is identified to suggest obstruction.  Perihepatic and  perisplenic ascites is small in volume and slightly increased since  09/28/2010. A perigastric nodule, likely in the gastrocolic  ligament measures 1.1 cm on image 39 and is not definitely apparent  on prior exams. The more inferior anterior peritoneal nodule is  not identified today (described on the 09/28/2010 study). The  fluid collection adjacent the right psoas muscle is markedly  diminished with minimal fascial thickening remaining on image 60.  No new drainable fluid collections are identified. Ill-defined  edema and fluid is identified anteriorly.  Lymph node just inferior to the psoas collection measures 1.1 cm on  image 73, and is likely reactive. Normal urinary bladder and  prostate. No cul-de-sac fluid. No  acute osseous abnormality.  IMPRESSION:  1. Sigmoid colonic anastomosis with defect in the lateral aspect  of the anastomoses. Enlarged fluid and gas collection along the  lateral aspect of the left psoas muscle, consistent with abscess.  2. Other areas of ill defined gas within the more medial left  pelvis and anterior pelvic wall are likely secondary.  3. The right-sided psoas collection has nearly completely resolved  with minimal ill-defined fluid remaining.  4. Peritoneal nodule, which omental/peritoneal metastasis cannot  be excluded. This is new since the prior.  5. Cirrhosis. Limited evaluation for focal hepatic lesion,  secondary to decreased IV contrast dosage.  6. Slight increase in small volume abdominal pelvic ascites.  7. Mild enlargement of a retrocrural nodule. This could be  reactive or metastatic.  8. Small bowel dilatation, without focal transition point.  Question mild adynamic ileus.  These results were called on 12/20/2010 at 12:46 p.m. to Dr.  Lovell Sheehan, who verbally acknowledged these results.  Original Report Authenticated By: Consuello Bossier, M.D.    ASSESSMENT:  1.  Stage I Colon cancer, S/P resection by Dr. Lovell Sheehan 2. Alcoholic liver disease, no longer drinking 3. Cirrhosis 4. Anemia of chronic disease   PLAN:  1. Lab work in 3 months: CBC diff, CEA 2. I will call Dr. Lovell Sheehan to discuss the peritoneal nodule found on CT scan.  Discussion with Dr. Lovell Sheehan reveals that he will be performing a follow-up CT scan.  We will keep an eye out for this result and see if this nodule is changing/evolving. 3. Patient is scheduled to see Dr. Lovell Sheehan on Thursday for follow-up 4. Return in 3 months for follow-up   All questions were answered. The patient knows to call the clinic with any problems, questions or concerns. We can certainly see the patient much sooner if necessary.  The patient and plan discussed with Glenford Peers, MD and Dr. Franky Macho and they are in  agreement with the aforementioned.   Peter [name]

## 2011-01-16 ENCOUNTER — Encounter (HOSPITAL_BASED_OUTPATIENT_CLINIC_OR_DEPARTMENT_OTHER): Payer: Medicaid Other

## 2011-01-16 DIAGNOSIS — D638 Anemia in other chronic diseases classified elsewhere: Secondary | ICD-10-CM

## 2011-01-16 LAB — CBC
HCT: 31.5 % — ABNORMAL LOW (ref 39.0–52.0)
MCV: 90.8 fL (ref 78.0–100.0)
Platelets: 269 10*3/uL (ref 150–400)
RBC: 3.47 MIL/uL — ABNORMAL LOW (ref 4.22–5.81)
RDW: 16.1 % — ABNORMAL HIGH (ref 11.5–15.5)
WBC: 6.2 10*3/uL (ref 4.0–10.5)

## 2011-01-16 NOTE — Progress Notes (Signed)
Labs drawn today for cbc 

## 2011-02-07 ENCOUNTER — Encounter: Payer: Self-pay | Admitting: Internal Medicine

## 2011-02-23 ENCOUNTER — Other Ambulatory Visit: Payer: Self-pay

## 2011-02-23 DIAGNOSIS — C189 Malignant neoplasm of colon, unspecified: Secondary | ICD-10-CM

## 2011-02-23 MED ORDER — DIPHENOXYLATE-ATROPINE 2.5-0.025 MG PO TABS: 1.0000 | ORAL_TABLET | Freq: Two times a day (BID) | ORAL | Status: DC

## 2011-02-23 NOTE — Telephone Encounter (Signed)
pts wife called- she stated he still takes this when he has diarrhea and wanted to know if he can have a refill.

## 2011-03-27 ENCOUNTER — Encounter (HOSPITAL_COMMUNITY): Payer: Medicaid Other | Attending: Oncology

## 2011-03-27 DIAGNOSIS — C189 Malignant neoplasm of colon, unspecified: Secondary | ICD-10-CM

## 2011-03-27 LAB — DIFFERENTIAL
Lymphs Abs: 1.1 10*3/uL (ref 0.7–4.0)
Monocytes Absolute: 0.4 10*3/uL (ref 0.1–1.0)
Monocytes Relative: 8 % (ref 3–12)
Neutro Abs: 3.5 10*3/uL (ref 1.7–7.7)
Neutrophils Relative %: 70 % (ref 43–77)

## 2011-03-27 LAB — CBC
HCT: 33.7 % — ABNORMAL LOW (ref 39.0–52.0)
Hemoglobin: 11.2 g/dL — ABNORMAL LOW (ref 13.0–17.0)
RBC: 3.52 MIL/uL — ABNORMAL LOW (ref 4.22–5.81)

## 2011-03-27 NOTE — Progress Notes (Signed)
Peter [name] presented for labwork. Labs per MD order drawn via Peripheral Line 25 gauge needle inserted in rt ac.  Good blood return present. Procedure without incident.  Needle removed intact. Patient tolerated procedure well.

## 2011-03-29 ENCOUNTER — Encounter (HOSPITAL_BASED_OUTPATIENT_CLINIC_OR_DEPARTMENT_OTHER): Payer: Medicaid Other | Admitting: Oncology

## 2011-03-29 VITALS — BP 130/78 | HR 85 | Temp 97.7°F | Ht 70.0 in | Wt 143.4 lb

## 2011-03-29 DIAGNOSIS — C180 Malignant neoplasm of cecum: Secondary | ICD-10-CM

## 2011-03-29 DIAGNOSIS — C189 Malignant neoplasm of colon, unspecified: Secondary | ICD-10-CM

## 2011-03-29 DIAGNOSIS — D638 Anemia in other chronic diseases classified elsewhere: Secondary | ICD-10-CM

## 2011-03-29 NOTE — Progress Notes (Signed)
[name], MD, MD [address] [place] [address]  1. Colon cancer  CBC, Differential, Comprehensive metabolic panel, CEA    CURRENT THERAPY:S/P resection by Dr. Lovell Sheehan in January 2012  INTERVAL HISTORY: Peter [name] 61 y.o. male returns for  regular  visit for followup of Stage I Colon cancer and anemia of chronic disease.  The patient is doing well.  He is being followed closely  By Dr. Lovell Sheehan for his draining fistula.  The patient reports that his fistula drains some serosanguinous fluid.  Otherwise, the patient is doing well.  He is slowly gaining weight.  His appetite is strong.  He denies any nausea and vomiting.  His bowel are operating appropriately.  He denies any complaints. ROS: No TIA's or unusual headaches, no dysphagia.  No prolonged cough. No dyspnea or chest pain on exertion.  No abdominal pain, change in bowel habits, black or bloody stools.  No urinary tract symptoms.  No new or unusual musculoskeletal symptoms.    The patient has not had a follow-up CT of his abd/pelvis and we will perform one.  I discussed the case with Dr. Lovell Sheehan and he is in agreement with this plan.   Past Medical History  Diagnosis Date  . Gout   . GERD (gastroesophageal reflux disease)   . Cirrhosis     ?ETOH related  . Colon cancer Dec 2011    Cecum  . ARF (acute renal failure)     hospitalization Jan 2012  . H/O ETOH abuse   . 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 S/P endoscopy Dec 2011    Schatzki's ring, Grade 1 esophageal varices, antral/body erosions  . Anemia of chronic disease 08/10/2010  . Renal insufficiency 10/24/2010    has CIRRHOSIS; EPIGASTRIC PAIN; TRANSAMINASES, SERUM, ELEVATED; Personal history of colon cancer; Dehydration; Colon cancer; Anemia of chronic disease; Diarrhea; Weight loss, abnormal; and Renal insufficiency on his problem list.     is allergic to ativan and percocet.  Mr. Englert does not  currently have medications on file.  Past Surgical History  Procedure Date  . Colon surgery 02/24/2010    colon cancer (cecum)  . Exploratory laparotomy 03/03/2010    anastomotic leak, developed EC fistula  . Exploratory laparotomy w/ bowel resection 04/18/2010    ileostomy placed, (hx of EC fistula, anastomotic leak). About two feet of ileum removed.  . Colostomy   . Percutaneous drainage of intraabdominal abscess 07/2010 and 09/2010  . Partial colectomy 10/30/2010    Procedure: PARTIAL COLECTOMY;  Surgeon: Dalia Heading;  Location: [facility];  Service: General;  Laterality: N/A;  . Ileostomy closure 10/30/2010    Procedure: ILEOSTOMY TAKEDOWN;  Surgeon: Dalia Heading;  Location: [facility];  Service: General;  Laterality: N/A;    Denies any headaches, dizziness, double vision, fevers, chills, night sweats, nausea, vomiting, diarrhea, constipation, chest pain, heart palpitations, shortness of breath, blood in stool, black tarry stool, urinary pain, urinary burning, urinary frequency, hematuria.   PHYSICAL EXAMINATION  ECOG PERFORMANCE STATUS: 1 - Symptomatic but completely ambulatory  Filed Vitals:   03/29/11 1022  BP: 130/78  Pulse: 85  Temp: 97.7 F (36.5 C)    GENERAL:alert, no distress, well nourished, well developed, cachectic, comfortable, cooperative and smiling SKIN: skin color, texture, turgor are normal, no rashes or significant lesions HEAD: Normocephalic, No masses, lesions, tenderness or abnormalities EYES: normal EARS: External ears normal OROPHARYNX:mucous membranes are moist  NECK: supple, no adenopathy, no bruits, thyroid normal size, non-tender, without nodularity, no stridor, non-tender, trachea midline LYMPH:  no palpable lymphadenopathy, no hepatosplenomegaly BREAST:not examined LUNGS: clear to auscultation and percussion HEART: regular rate & rhythm, no murmurs, no gallops, S1 normal and S2 normal ABDOMEN:abdomen soft, non-tender, normal bowel sounds, no masses or  organomegaly, abdominal surgical scars noted at the midline and 2 lateral to the midline with the one on the left with a clean gauze dressing and no hepatosplenomegaly BACK: Back symmetric, no curvature., No CVA tenderness EXTREMITIES:less then 2 second capillary refill, no joint deformities, effusion, or inflammation, no edema, no skin discoloration, no clubbing, no cyanosis  NEURO: alert & oriented x 3 with fluent speech, no focal motor/sensory deficits, gait normal   LABORATORY DATA: CBC    Component Value Date/Time   WBC 5.0 03/27/2011 0917   RBC 3.52* 03/27/2011 0917   HGB 11.2* 03/27/2011 0917   HCT 33.7* 03/27/2011 0917   PLT 154 03/27/2011 0917   MCV 95.7 03/27/2011 0917   MCH 31.8 03/27/2011 0917   MCHC 33.2 03/27/2011 0917   RDW 14.7 03/27/2011 0917   LYMPHSABS 1.1 03/27/2011 0917   MONOABS 0.4 03/27/2011 0917   EOSABS 0.0 03/27/2011 0917   BASOSABS 0.0 03/27/2011 0917   Results for BREWSTER, WOLTERS (MRN [id number]) as of 03/29/2011 11:20  Ref. Range 02/15/2010 15:37 07/31/2010 17:45 10/24/2010 11:51 03/27/2011 09:17  CEA Latest Range: 0.0-5.0 ng/mL 2.1 2.8 1.2 1.8    RADIOGRAPHIC STUDIES:  12/20/10  *RADIOLOGY REPORT*  Clinical Data: Colon cancer with colostomy placed 01/12. Reversal  of colostomy 09/12. Ethanol abuse and cirrhosis. Evaluate  abscess.  CT ABDOMEN AND PELVIS WITH CONTRAST  Technique: Multidetector CT imaging of the abdomen and pelvis was  performed following the standard protocol during bolus  administration of intravenous contrast.  Contrast: 50mL OMNIPAQUE IOHEXOL 300 MG/ML IV SOLN  Comparison: 10/04/2010  Findings: Clear lung bases. Coronary artery atherosclerosis in the  LAD on image 2. Trace right-sided pleural fluid which is decreased  since 10/04/2010. The right retrocrural node measures 7 mm on  image 13 versus 6 mm on 09/28/2010.  Mild cirrhosis. Limited evaluation for focal liver lesion,  secondary to low contrast dosage. Normal spleen, stomach. Mild  pancreatic  atrophy. Normal gallbladder, biliary tract, adrenal  glands.  Normal kidneys. Mildly prominent retroperitoneal lymph nodes.  None pathologic by size criteria. Index para-aortic node measures  8 mm on image 41 versus 6 mm on the prior.  Status post sigmoid too small bowel anastomosis, new in the  interval. Persistent defect within the lateral aspect of the  distal-most portion of the surgical suture line. Example image 60  of series 2. This is at the site of contrast tract on the  10/04/2010 study.  This tracks to and ill-defined fluid and gas collection positioned  anterolateral the left psoas muscle. 3.7 x 4.7 cm on image 63.  3.1 x 2.6 cm on 09/28/2010. Gas is seen inferomedial to this  collection on image 69, extraluminal. There is also extension of  the gas into the adjacent anterior left pelvic wall ( at the site  of prior surgical drain, per discussion with Dr. Lovell Sheehan). Image  63 of series 2.  Small bowel loops measure up to 3.8 cm. No focal transition  point is identified to suggest obstruction. Perihepatic and  perisplenic ascites is small in volume and slightly increased since  09/28/2010. A perigastric nodule, likely in the gastrocolic  ligament measures 1.1 cm  on image 39 and is not definitely apparent  on prior exams. The more inferior anterior peritoneal nodule is  not identified today (described on the 09/28/2010 study). The  fluid collection adjacent the right psoas muscle is markedly  diminished with minimal fascial thickening remaining on image 60.  No new drainable fluid collections are identified. Ill-defined  edema and fluid is identified anteriorly.  Lymph node just inferior to the psoas collection measures 1.1 cm on  image 73, and is likely reactive. Normal urinary bladder and  prostate. No cul-de-sac fluid. No acute osseous abnormality.  IMPRESSION:  1. Sigmoid colonic anastomosis with defect in the lateral aspect  of the anastomoses. Enlarged fluid and gas  collection along the  lateral aspect of the left psoas muscle, consistent with abscess.  2. Other areas of ill defined gas within the more medial left  pelvis and anterior pelvic wall are likely secondary.  3. The right-sided psoas collection has nearly completely resolved  with minimal ill-defined fluid remaining.  4. Peritoneal nodule, which omental/peritoneal metastasis cannot  be excluded. This is new since the prior.  5. Cirrhosis. Limited evaluation for focal hepatic lesion,  secondary to decreased IV contrast dosage.  6. Slight increase in small volume abdominal pelvic ascites.  7. Mild enlargement of a retrocrural nodule. This could be  reactive or metastatic.  8. Small bowel dilatation, without focal transition point.  Question mild adynamic ileus.  These results were called on 12/20/2010 at 12:46 p.m. to Dr.  Lovell Sheehan, who verbally acknowledged these results.  Original Report Authenticated By: Consuello Bossier, M.D.   PATHOLOGY: 10/30/10 Diagnosis Colon, segmental resection, colon and anastomosis - SMALL BOWEL WITH DENSE FIBROUS ADHESIONS AND FOCAL NODULAR MESENTERIC FIBROSIS. NO EVIDENCE OF MALIGNANCY. SEE MICROSCOPIC DESCRIPTION. - FINDINGS CONSISTENT WITH ILEOSTOMY STOMA. NO EVIDENCE OF MALIGNANCY. Microscopic Comment The segment of intestine shows small bowel with dense serosal fibrous adhesions, hyperemia and focal hemorrhage. No area consistent with anastomosis is identified. Within the attached mesentery, there is a 2.5 cm area of dense fibrosis characterized by interlacing fascicles of fibrous tissue with focal areas of hyperemia and hemorrhage. This is felt most likely to represent reactive fibrosis rather than denovo fibromatosis. (JDP:gt, 11/01/10) Jimmy Picket MD Pathologist, Electronic Signature (Case signed 11/01/2010)   ASSESSMENT:  1. Stage I Colon cancer, S/P resection by Dr. Lovell Sheehan.  Followed with CEA every 3 months  2. Alcoholic liver disease, no longer  drinking  3. Cirrhosis  4. Anemia of chronic disease   PLAN:  1. I personally reviewed and went over radiographic studies with the patient. 2. I personally reviewed and went over laboratory results with the patient. 3. Discussed the patient's case with Dr. Lovell Sheehan.  He agrees with the plan of performing a follow CT of Abd/pelvis for the peritoneal nodules seen on prior CT scan. 4. Lab work every 3 months: CBC diff, CEA, CMET 5. CMET within the next 2-3 weeks prior to CT scan. 6. CT of abd/pelvis with contrast within the next 2-3 weeks. 7. Return in 6 months for follow-up.  Will see the patient sooner if needed pending CT results.    All questions were answered. The patient knows to call the clinic with any problems, questions or concerns. We can certainly see the patient much sooner if necessary.  I spent 15 minutes counseling the patient face to face. The total time spent in the appointment was 25 minutes.  Keeara Frees

## 2011-03-29 NOTE — Patient Instructions (Signed)
[facility]  Discharge Instructions Peter [name]  [id number] [birth year]  RECOMMENDATIONS MADE BY THE CONSULTANT AND ANY TEST RESULTS WILL BE SENT TO YOUR REFERRING DOCTOR.   EXAM FINDINGS BY MD TODAY AND SIGNS AND SYMPTOMS TO REPORT TO CLINIC OR PRIMARY MD: Exam findings as discussed by T. Jacalyn Lefevre, PA-C.  Staff at Dr. Lovell Sheehan' office or our office will be in contact with you about scheduling a CT.  We will see you back in our office in 6 months; you will also be scheduled for labs every 3 months.  I acknowledge that I have been informed and understand all the instructions given to me and received a copy. I do not have any more questions at this time, but understand that I may call the Specialty Clinic at [facility] at [phone] during business hours should I have any further questions or need assistance in obtaining follow-up care.    __________________________________________  _____________  __________ Signature of Patient or Authorized Representative            Date                   Time    __________________________________________ Nurse's Signature

## 2011-04-12 ENCOUNTER — Encounter (HOSPITAL_BASED_OUTPATIENT_CLINIC_OR_DEPARTMENT_OTHER): Payer: Medicaid Other

## 2011-04-12 DIAGNOSIS — C180 Malignant neoplasm of cecum: Secondary | ICD-10-CM

## 2011-04-12 DIAGNOSIS — C189 Malignant neoplasm of colon, unspecified: Secondary | ICD-10-CM

## 2011-04-12 LAB — COMPREHENSIVE METABOLIC PANEL
ALT: 11 U/L (ref 0–53)
AST: 24 U/L (ref 0–37)
Albumin: 3.8 g/dL (ref 3.5–5.2)
CO2: 28 mEq/L (ref 19–32)
Calcium: 9.5 mg/dL (ref 8.4–10.5)
Chloride: 101 mEq/L (ref 96–112)
Creatinine, Ser: 1.39 mg/dL — ABNORMAL HIGH (ref 0.50–1.35)
GFR calc non Af Amer: 54 mL/min — ABNORMAL LOW (ref >90)
Sodium: 140 mEq/L (ref 135–145)
Total Bilirubin: 0.6 mg/dL (ref 0.3–1.2)

## 2011-04-12 NOTE — Progress Notes (Signed)
Labs drawn today for cmp 

## 2011-04-13 ENCOUNTER — Ambulatory Visit (HOSPITAL_COMMUNITY)
Admission: RE | Admit: 2011-04-13 | Discharge: 2011-04-13 | Disposition: A | Discharge status: Home / Self Care | Payer: Medicaid Other | Source: Ambulatory Visit | Attending: Oncology | Admitting: Oncology

## 2011-04-13 DIAGNOSIS — C189 Malignant neoplasm of colon, unspecified: Secondary | ICD-10-CM

## 2011-04-13 DIAGNOSIS — C20 Malignant neoplasm of rectum: Secondary | ICD-10-CM | POA: Insufficient documentation

## 2011-04-13 DIAGNOSIS — R935 Abnormal findings on diagnostic imaging of other abdominal regions, including retroperitoneum: Secondary | ICD-10-CM | POA: Insufficient documentation

## 2011-04-13 MED ORDER — IOHEXOL 300 MG/ML  SOLN
100.0000 mL | Freq: Once | INTRAMUSCULAR | Status: AC | PRN
  Administered 2011-04-13: 100 mL via INTRAVENOUS

## 2011-04-17 ENCOUNTER — Other Ambulatory Visit (HOSPITAL_COMMUNITY): Payer: Self-pay | Admitting: Oncology

## 2011-04-17 ENCOUNTER — Telehealth (HOSPITAL_COMMUNITY): Payer: Self-pay | Admitting: Oncology

## 2011-04-17 NOTE — Telephone Encounter (Signed)
Spoke with patient's wife.  Reviewed most recent CT scan.  Will order IR CT guided biopsy.  Will communicate with scheduler.  Josely Moffat

## 2011-04-18 ENCOUNTER — Encounter: Payer: Self-pay | Admitting: Urgent Care

## 2011-04-18 ENCOUNTER — Other Ambulatory Visit (HOSPITAL_COMMUNITY): Payer: Self-pay | Admitting: Oncology

## 2011-04-18 ENCOUNTER — Telehealth: Payer: Self-pay | Admitting: Urgent Care

## 2011-04-18 ENCOUNTER — Ambulatory Visit (INDEPENDENT_AMBULATORY_CARE_PROVIDER_SITE_OTHER): Payer: Medicaid Other | Admitting: Urgent Care

## 2011-04-18 DIAGNOSIS — K669 Disorder of peritoneum, unspecified: Secondary | ICD-10-CM

## 2011-04-18 DIAGNOSIS — K7460 Unspecified cirrhosis of liver: Secondary | ICD-10-CM

## 2011-04-18 DIAGNOSIS — C189 Malignant neoplasm of colon, unspecified: Secondary | ICD-10-CM

## 2011-04-18 DIAGNOSIS — R599 Enlarged lymph nodes, unspecified: Secondary | ICD-10-CM

## 2011-04-18 DIAGNOSIS — K668 Other specified disorders of peritoneum: Secondary | ICD-10-CM

## 2011-04-18 DIAGNOSIS — R590 Localized enlarged lymph nodes: Secondary | ICD-10-CM

## 2011-04-18 NOTE — Progress Notes (Signed)
Primary Care Physician:  [name], MD, MD Referring physician: Franky Macho, M.D. Primary Gastroenterologist:  Roetta Sessions, MD  Chief Complaint  Patient presents with  . Follow-up    Cirrhosis/colon cancer    HPI:  Peter [name] is a 61 y.o. male here for further evaluation of advancing cirrhosis. His history is complicated in the fact that he had stage I colon cancer status post hemicolectomy by Dr. Lovell Sheehan followed by two surgeries, one for anastomotic leak and then subsequent surgery for reversal of ileostomy.  He has been noticing a yellowish green drainage from fistula tract. He has been seen by Dr. Lovell Sheehan. He has been spiking fevers at night. His last temp was 100.8 last night.  He has noticed some intermittent lower extremity dependent edema. He complains of "burning to the bottom of his feet".   He never received his hepatitis A and B. Vaccines.  He has barely been gaining weight.  His appetite has improved. He does have lots of gas and bloating. Dr. Jerelyn Scott ordered CT scan with IV contrast done 04/13/11.  There were multiple findings including cirrhotic liver with splenomegaly, prominent patent splenic vein and small varices perigastric region.  Increase in size of porta hepatis/peripancreatic lymph nodes with persistent retrocrural lymph nodes.  Decrease in amount of ascites.  It is possible these findings are related to cirrhosis although excluding tumor contributing to the lymph nodes and no ascites is not excluded.  Retroperitoneal lymph nodes are noted.  Previously noted index lymph node left periaortic region (series 2 image 35) has increased in size now 9.1 mm versus prior 7.1 mm.  Increased number of top normal size mesenteric lymph nodes.  Previously noted lymph node below abscess has decreased slightly in size with a fatty center (image 67).  Left external iliac lymph node has increased slightly in size now measuring 1.7 x 1 cm versus prior 1.2 x 0.7 cm (series 2 image  75).  Prior colonic resection with small bowel reanastomosis.  Slight thickening of small bowel right lower quadrant may represent underdistension although inflammation or tumor not entirely excluded (series 2 image 61).  The abscess along the left lateral margin of the sigmoid colon and anastomotic site extending to involve the left psoas muscle has changed since prior exam.  There remains a curvilinear air and fluid collection however there has been interval improvement in the appearance of the left iliopsoas muscle.  It is possible this represents residua of prior abscess and fistula.  Difficult to exclude tumor in this region. Prior colostomy site noted.  The previously described gastrocolic peritoneal 1.1 cm nodule has decreased in size now measuring 6 mm (series 2 image 36).  No focal liver, splenic, renal, adrenal or pancreatic mass noted.  Contracted urinary bladder with diffusely thickened wall. Heterogeneous prostate gland with calcifications.  Although peritoneal implant appears slightly smaller, retroperitoneal lymph nodes and left external iliac lymph node appears slightly larger and therefore progressive tumor not excluded.  (Original Report Authenticated By: Fuller Canada, M.D.)  Lab Summary Latest Ref Rng 04/12/2011 03/27/2011 01/16/2011  Hemoglobin 13.0 - 17.0 g/dL (None) 16.1 (L) 09.6 (L)  Hematocrit 39.0 - 52.0 % (None) 33.7 (L) 31.5 (L)  White count 4.0 - 10.5 K/uL (None) 5.0 6.2  Platelet count 150 - 400 K/uL (None) 154 269  Sodium 135 - 145 mEq/L 140 (None) (None)  Potassium 3.5 - 5.1 mEq/L 3.8 (None) (None)  Calcium 8.4 - 10.5 mg/dL 9.5 (None) (None)  Phosphorus 2.3 - 4.6  mg/dL (None) (None) (None)  Creatinine 0.50 - 1.35 mg/dL 4.78 (H) (None) (None)  AST 0 - 37 U/L 24 (None) (None)  Alk Phos 39 - 117 U/L 123 (H) (None) (None)  Bilirubin 0.3 - 1.2 mg/dL 0.6 (None) (None)  Glucose 70 - 99 mg/dL 82 (None) (None)  Cholesterol  (None) (None) (None)  HDL cholesterol  (None) (None)  (None)  Triglycerides  (None) (None) (None)  LDL Direct  (None) (None) (None)  LDL Calc  (None) (None) (None)  Total protein 6.0 - 8.3 g/dL 7.2 (None) (None)  Albumin 3.5 - 5.2 g/dL 3.8 (None) (None)    Current Outpatient Prescriptions  Medication Sig Dispense Refill  . ALPRAZolam (XANAX) 0.25 MG tablet Take 0.25 mg by mouth 2 (two) times daily as needed. For anxiety        . diphenoxylate-atropine (LOMOTIL) 2.5-0.025 MG per tablet Take 1 tablet by mouth 2 (two) times daily.  60 tablet  1  . HYDROcodone-acetaminophen (NORCO) 5-325 MG per tablet Take 1 tablet by mouth every 6 (six) hours as needed. For pain      . loperamide (IMODIUM) 2 MG capsule Take 2 mg by mouth 4 (four) times daily as needed.        Marland Kitchen MILK THISTLE PO Take 240 mg by mouth daily.      . pantoprazole (PROTONIX) 40 MG tablet Take 40 mg by mouth 2 (two) times daily.       . promethazine (PHENERGAN) 25 MG tablet Take 25 mg by mouth every 4 (four) hours as needed.        . simethicone (MYLICON) 125 MG chewable tablet Chew 125 mg by mouth every 6 (six) hours as needed.        Allergies as of 04/18/2011 - Review Complete 04/18/2011  Allergen Reaction Noted  . Ativan Other (See Comments) 08/01/2010  . Percocet (oxycodone-acetaminophen) Itching and Nausea Only 08/01/2010    Past Medical History  Diagnosis Date  . Gout   . GERD (gastroesophageal reflux disease)   . Cirrhosis     ?ETOH related  . Colon cancer Dec 2011    Cecum  . ARF (acute renal failure)     hospitalization Jan 2012  . H/O ETOH abuse   . 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 S/P endoscopy Dec 2011    Schatzki's ring, Grade 1 esophageal varices, antral/body erosions  . Anemia of chronic disease 08/10/2010  . Renal insufficiency 10/24/2010    Past Surgical History  Procedure Date  . Colon surgery 02/24/2010    colon cancer (cecum)  . Exploratory laparotomy 03/03/2010    anastomotic leak, developed EC  fistula  . Exploratory laparotomy w/ bowel resection 04/18/2010    ileostomy placed, (hx of EC fistula, anastomotic leak). About two feet of ileum removed.  . Colostomy   . Percutaneous drainage of intraabdominal abscess 07/2010 and 09/2010  . Partial colectomy 10/30/2010    Procedure: PARTIAL COLECTOMY;  Surgeon: Dalia Heading;  Location: [facility];  Service: General;  Laterality: N/A;  . Ileostomy closure 10/30/2010    Procedure: ILEOSTOMY TAKEDOWN;  Surgeon: Dalia Heading;  Location: [facility];  Service: General;  Laterality: N/A;    Family History  Problem Relation Age of Onset  . Cirrhosis Father     deceased, secondary to ETOH   Review of Systems: Gen: Denies any weakness, malaise, weight loss, and sleep disorder CV: Denies chest pain, angina, palpitations, syncope,  orthopnea, PND, peripheral edema, and claudication. Resp: Denies dyspnea at rest, dyspnea with exercise, cough, sputum, wheezing, coughing up blood, and pleurisy. GI: Denies vomiting blood, jaundice, and fecal incontinence.   Denies dysphagia or odynophagia. Derm: Erythema the site of fistula tract. Psych: Denies depression, anxiety, memory loss, suicidal ideation, hallucinations, paranoia, and confusion. Heme: Denies bruising, bleeding, and enlarged lymph nodes.

## 2011-04-18 NOTE — Assessment & Plan Note (Signed)
Peter [name] is a pleasant 61 y.o. male with previously well compensated cirrhosis felt to be due to alcohol. EGD with grade 1 varices previously. Never had hep A./B. vaccines. History of elevated AFP, but normal October 2012.  AFP, LFTs, INR., CBC, ferritin in April 2013

## 2011-04-18 NOTE — Assessment & Plan Note (Signed)
Significant abdominal, including mesenteric and retroperitoneal adenopathy.  Abnormal CT scan has been discussed with Dr. Jena Gauss who is in the process of reviewing films. Plans are being made for biopsy via IR.

## 2011-04-18 NOTE — Telephone Encounter (Signed)
Please call pt. Let him know RMR is going to review his CT. I will call as soon as he gets back with me most likely tomorrow regarding next step. Thanks

## 2011-04-18 NOTE — Patient Instructions (Signed)
Keep dressing to fistula site clean and dry Call if fever greater than 101 I will review the CT findings with Dr. Jena Gauss and call you with further recommendations Keep followup with Dr. Mariel Sleet Re: lymph node biopsy

## 2011-04-18 NOTE — Assessment & Plan Note (Signed)
Stage I.  Overdue for followup colonoscopy status post resection. This has been postponed due to multiple complications and surgeries.

## 2011-04-18 NOTE — Telephone Encounter (Signed)
Pt's wife was wanting to know if you was going to put him on or call in a antibx for him. Please advise

## 2011-04-19 ENCOUNTER — Telehealth: Payer: Self-pay | Admitting: Internal Medicine

## 2011-04-19 NOTE — Telephone Encounter (Signed)
Reviewed CT scan yesterday with Dr. Tyron Russell (compared to the one from last fall). On balance, the more recent CT looks a little bit better. Loculated fluid collection with air in the left gutter appeared bothersome on both films. Has some  ascites as well. Possibly small gastroesophageal varices. If patient has continued fistula drainage, and is febrile, CT images would be concerning for ongoing infection. I do not feel that cirrhosis has anything do with the issue at hand. I would recommend continue postoped management

## 2011-04-19 NOTE — Telephone Encounter (Signed)
No answer LMOM Asked pt to return call between 8-12 tomorrow

## 2011-04-19 NOTE — Telephone Encounter (Signed)
KJ tried to call pt. She wants to speak to pt when they call back.

## 2011-04-19 NOTE — Progress Notes (Signed)
Discussed with Dr. Jena Gauss reviewed CT scan with Dr. Pia Mau. Most recent CT does look somewhat better. However, there is a loculated fluid collection with air in the left gutter which appears worrisome and persistent on both films. He also has ascites.  He felt that if he has continued fistula drainage, and is febrile, CT images would be concerning for ongoing infection. He did not feel that the cause is cirrhosis has anything to do with the issue at this point. He recommends postop management through his surgeon, Dr. Lovell Sheehan.   please see phone note I have attempted to contact patient with this information

## 2011-04-19 NOTE — Telephone Encounter (Signed)
Pt called today asking if RMR knew what the next step was with the patient. I told wife that we were waiting for RMR to review the CT and he would advise [place] from there and someone would be getting in touch with them.

## 2011-04-19 NOTE — Telephone Encounter (Signed)
Dr Jena Gauss- Have you had a chance to review CT yet?

## 2011-04-19 NOTE — Telephone Encounter (Signed)
LMOM for return call.  I can speak w/ pt when he returns call regarding recommendations Thanks

## 2011-04-20 NOTE — Telephone Encounter (Signed)
Pt gave verbal permission to [employer] & myself to discuss care with his wife.  They were advised to complete HIPPA release at next OV. Dr Luvenia Starch recommendations given. No purulent drainage, no fever. Dr Mariel Sleet advised to wait 4 months & repeat CT as lymph node too small to biopsy. Management of fistula per Dr Lovell Sheehan. Consider colonoscopy after fistula is managed. Keep labs from cirrhosis end of April 2013

## 2011-06-04 ENCOUNTER — Other Ambulatory Visit (HOSPITAL_COMMUNITY): Payer: Self-pay

## 2011-06-04 ENCOUNTER — Encounter (HOSPITAL_COMMUNITY): Payer: Medicaid Other | Attending: Oncology

## 2011-06-04 DIAGNOSIS — C189 Malignant neoplasm of colon, unspecified: Secondary | ICD-10-CM

## 2011-06-04 LAB — COMPREHENSIVE METABOLIC PANEL
AST: 32 U/L (ref 0–37)
Alkaline Phosphatase: 110 U/L (ref 39–117)
CO2: 28 mEq/L (ref 19–32)
Chloride: 100 mEq/L (ref 96–112)
Creatinine, Ser: 1.22 mg/dL (ref 0.50–1.35)
GFR calc non Af Amer: 64 mL/min — ABNORMAL LOW (ref >90)
Total Bilirubin: 1.6 mg/dL — ABNORMAL HIGH (ref 0.3–1.2)

## 2011-06-04 LAB — CBC
HCT: 37.2 % — ABNORMAL LOW (ref 39.0–52.0)
Hemoglobin: 12.8 g/dL — ABNORMAL LOW (ref 13.0–17.0)
RDW: 14.1 % (ref 11.5–15.5)
WBC: 5.3 10*3/uL (ref 4.0–10.5)

## 2011-06-04 LAB — DIFFERENTIAL
Basophils Absolute: 0 10*3/uL (ref 0.0–0.1)
Lymphocytes Relative: 18 % (ref 12–46)
Monocytes Absolute: 0.5 10*3/uL (ref 0.1–1.0)
Monocytes Relative: 10 % (ref 3–12)
Neutro Abs: 3.8 10*3/uL (ref 1.7–7.7)

## 2011-06-04 MED ORDER — POTASSIUM CHLORIDE CRYS ER 20 MEQ PO TBCR: 10.0000 meq | EXTENDED_RELEASE_TABLET | Freq: Every day | ORAL | Status: DC

## 2011-06-04 NOTE — Progress Notes (Signed)
Lab draw

## 2011-06-06 ENCOUNTER — Other Ambulatory Visit (HOSPITAL_COMMUNITY): Payer: Medicaid Other

## 2011-06-06 ENCOUNTER — Encounter (HOSPITAL_BASED_OUTPATIENT_CLINIC_OR_DEPARTMENT_OTHER): Payer: Medicaid Other | Admitting: Oncology

## 2011-06-06 VITALS — Wt 144.2 lb

## 2011-06-06 DIAGNOSIS — D638 Anemia in other chronic diseases classified elsewhere: Secondary | ICD-10-CM

## 2011-06-06 DIAGNOSIS — C189 Malignant neoplasm of colon, unspecified: Secondary | ICD-10-CM

## 2011-06-06 DIAGNOSIS — K7030 Alcoholic cirrhosis of liver without ascites: Secondary | ICD-10-CM

## 2011-06-06 MED ORDER — HYDROCODONE-ACETAMINOPHEN 5-325 MG PO TABS: 1.0000 | ORAL_TABLET | Freq: Four times a day (QID) | ORAL | Status: DC | PRN

## 2011-06-06 NOTE — Progress Notes (Signed)
Peter Reichert, MD, MD [address] [place] [address]  1. Colon cancer  HYDROcodone-acetaminophen (NORCO) 5-325 MG per tablet, CT Abdomen Pelvis W Contrast, CBC, Differential, Comprehensive metabolic panel, CEA    CURRENT THERAPY: S/P resection by Dr. Lovell [name] in January 2012  INTERVAL HISTORY: Peter [name] 61 y.o. male returns for  regular  visit for followup of Stage I Colon cancer and anemia of chronic disease.   The patient reports some drainage from a previous drain site.  He reports orange discharge from this area.  He denies any fevers or chills.  He also notes some abdominal swelling.  This is likely ascites from his cirrhosis of liver.  The is tender in the left lower quadrant and near his midline incision site.   I personally reviewed and went over laboratory results with the patient. His CEA remains stable.  He is minimally hypokalemic and we will correct that with Kdur. I personally reviewed and went over radiographic studies with the patient.  He reports that he is out of his Hydrocodone will refill this today.    Otherwise, the patient denies any complaints.  He is due to see Dr. Lovell [name] tomorrow.   He has gained some weight.  He weighs in at 144 lbs today compared to 134 lbs on 10/24/2010.  Past Medical History  Diagnosis Date  . Gout   . GERD (gastroesophageal reflux disease)   . Cirrhosis     ?ETOH related  . Colon cancer Dec 2011    Cecum  . ARF (acute renal failure)     hospitalization Jan 2012  . H/O ETOH abuse   . 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 S/P endoscopy Dec 2011    Schatzki's ring, Grade 1 esophageal varices, antral/body erosions  . Anemia of chronic disease 08/10/2010  . Renal insufficiency 10/24/2010    has CIRRHOSIS; EPIGASTRIC PAIN; TRANSAMINASES, SERUM, ELEVATED; Personal history of colon cancer; Dehydration; Colon cancer; Anemia of chronic disease; Diarrhea; Weight loss, abnormal; Renal  insufficiency; and Retroperitoneal lymphadenopathy on his problem list.     is allergic to ativan and percocet.  Peter [name] does not currently have medications on file.  Past Surgical History  Procedure Date  . Colon surgery 02/24/2010    colon cancer (cecum)  . Exploratory laparotomy 03/03/2010    anastomotic leak, developed EC fistula  . Exploratory laparotomy w/ bowel resection 04/18/2010    ileostomy placed, (hx of EC fistula, anastomotic leak). About two feet of ileum removed.  . Colostomy   . Percutaneous drainage of intraabdominal abscess 07/2010 and 09/2010  . Partial colectomy 10/30/2010    Procedure: PARTIAL COLECTOMY;  Surgeon: Peter [name];  Location: [facility];  Service: General;  Laterality: N/A;  . Ileostomy closure 10/30/2010    Procedure: ILEOSTOMY TAKEDOWN;  Surgeon: Peter [name];  Location: [facility];  Service: General;  Laterality: N/A;    Denies any headaches, dizziness, double vision, fevers, chills, night sweats, nausea, vomiting, diarrhea, constipation, chest pain, heart palpitations, shortness of breath, blood in stool, black tarry stool, urinary pain, urinary burning, urinary frequency, hematuria.   PHYSICAL EXAMINATION  ECOG PERFORMANCE STATUS: 1 - Symptomatic but completely ambulatory  There were no vitals filed for this visit.  GENERAL:alert, well nourished, well developed, cachectic and smiling SKIN: skin color, texture, turgor are normal, no rashes or significant lesions HEAD: Normocephalic, No masses, lesions, tenderness or abnormalities EYES: normal, EOMI, Conjunctiva are pink and  non-injected EARS: External ears normal OROPHARYNX:lips, buccal mucosa, and tongue normal and mucous membranes are moist  NECK: supple, no adenopathy, no stridor, non-tender, trachea midline LYMPH:  no palpable lymphadenopathy, no hepatosplenomegaly BREAST:not examined LUNGS: clear to auscultation and percussion HEART: regular rate & rhythm, no murmurs, no gallops, S1 normal  and S2 normal ABDOMEN:abdomen soft, normal bowel sounds, ascites noted, midline abdominal scar noted, LLQ lesion with drainage, LLQ tenderness, and tenderness at the inferior part of the incision site and no hepatosplenomegaly BACK: Back symmetric, no curvature., No CVA tenderness EXTREMITIES:less then 2 second capillary refill, no joint deformities, effusion, or inflammation, no edema, no skin discoloration, no clubbing, no cyanosis  NEURO: alert & oriented x 3 with fluent speech, no focal motor/sensory deficits, gait normal   LABORATORY DATA: Results for Peter [name] (MRN [id number]) as of 06/06/2011 15:17  Ref. Range 06/04/2011 09:35  Sodium Latest Range: 135-145 mEq/L 139  Potassium Latest Range: 3.5-5.1 mEq/L 3.4 (L)  Chloride Latest Range: 96-112 mEq/L 100  CO2 Latest Range: 19-32 mEq/L 28  BUN Latest Range: 6-23 mg/dL 14  Creat Latest Range: 0.50-1.35 mg/dL 4.09  Calcium Latest Range: 8.4-10.5 mg/dL 9.8  GFR calc non Af Amer Latest Range: >90 mL/min 64 (L)  GFR calc Af Amer Latest Range: >90 mL/min 74 (L)  Glucose Latest Range: 70-99 mg/dL 811 (H)  Alkaline Phosphatase Latest Range: 39-117 U/L 110  Albumin Latest Range: 3.5-5.2 g/dL 3.8  AST Latest Range: 0-37 U/L 32  ALT Latest Range: 0-53 U/L 21  Total Protein Latest Range: 6.0-8.3 g/dL 7.1  Total Bilirubin Latest Range: 0.3-1.2 mg/dL 1.6 (H)  WBC Latest Range: 4.0-10.5 K/uL 5.3  RBC Latest Range: 4.22-5.81 MIL/uL 3.92 (L)  HGB Latest Range: 13.0-17.0 g/dL 91.4 (L)  HCT Latest Range: 39.0-52.0 % 37.2 (L)  MCV Latest Range: 78.0-100.0 fL 94.9  MCH Latest Range: 26.0-34.0 pg 32.7  MCHC Latest Range: 30.0-36.0 g/dL 78.2  RDW Latest Range: 11.5-15.5 % 14.1  Platelets Latest Range: 150-400 K/uL 160  Neutrophils Relative Latest Range: 43-77 % 71  Lymphocytes Relative Latest Range: 12-46 % 18  Monocytes Relative Latest Range: 3-12 % 10  Eosinophils Relative Latest Range: 0-5 % 1  Basophils Relative Latest Range: 0-1 % 0    NEUT# Latest Range: 1.7-7.7 K/uL 3.8  Lymphocytes Absolute Latest Range: 0.7-4.0 K/uL 0.9  Monocytes Absolute Latest Range: 0.1-1.0 K/uL 0.5  Eosinophils Absolute Latest Range: 0.0-0.7 K/uL 0.0  Basophils Absolute Latest Range: 0.0-0.1 K/uL 0.0  CEA Latest Range: 0.0-5.0 ng/mL 3.0     RADIOGRAPHIC STUDIES:  04/12/2010  *RADIOLOGY REPORT*  Clinical Data: Rectal cancer post resection. Stage I. Denies  chemotherapy/radiation therapy. Follow-up peritoneal nodules.  CT ABDOMEN AND PELVIS WITH CONTRAST  Technique: Multidetector CT imaging of the abdomen and pelvis was  performed following the standard protocol during bolus  administration of intravenous contrast.  Contrast: OMNIPAQUE IOHEXOL 300 MG/ML IV SOLN .  Comparison: 12/20/2010.  Findings: Cirrhotic liver with splenomegaly, prominent patent  splenic vein and small varices perigastric region. Increase in  size of porta hepatis/peripancreatic lymph nodes with persistent  retrocrural lymph nodes. Decrease in amount of ascites. It is  possible these findings are related to cirrhosis although excluding  tumor contributing to the lymph nodes and no ascites is not  excluded.  Retroperitoneal lymph nodes are noted. Previously noted index  lymph node left periaortic region (series 2 image 35) has increased  in size now 9.1 mm versus prior 7.1 mm. Increased number of top  normal size mesenteric lymph nodes.  Previously noted lymph node below abscess has decreased slightly in  size with a fatty center (image 67).  Left external iliac lymph node has increased slightly in size now  measuring 1.7 x 1 cm versus prior 1.2 x 0.7 cm (series 2 image 75).  Prior colonic resection with small bowel reanastomosis. Slight  thickening of small bowel right lower quadrant may represent under  distension although inflammation or tumor not entirely excluded  (series 2 image 61).  The abscess along the left lateral margin of the sigmoid colon and   anastomotic site extending to involve the left psoas muscle has  changed since prior exam. There remains a curvilinear air and  fluid collection however there has been interval improvement in the  appearance of the left iliopsoas muscle. It is possible this  represents residua of prior abscess and fistula. Difficult to  exclude tumor in this region. Prior colostomy site noted.  The previously described gastrocolic peritoneal 1.1 cm nodule has  decreased in size now measuring 6 mm (series 2 image 36).  No focal liver, splenic, renal, adrenal or pancreatic mass noted.  No bony destructive lesion. Visualized lung bases clear.  Contracted urinary bladder with diffusely thickened wall.  Heterogeneous prostate gland with calcifications.  IMPRESSION:  Cirrhotic changes may contribute to ascites and upper abdominal  lymph nodes. Difficult to exclude tumor contributing to the lymph  nodes.  Although peritoneal implant appears slightly smaller,  retroperitoneal lymph nodes and left external iliac lymph node  appears slightly larger and therefore progressive tumor not  excluded.  Improvement although incomplete clearance of abnormality involving  the left iliopsoas muscle. This may represent residua of prior  abscess. There remains a gas containing tract extending from the  left lateral aspect of the sigmoid colon and anastomotic site.  This may represent a fistula or residua of prior abscess.  Please see above discussion.  Original Report Authenticated By: Fuller Canada, M.D.  PATHOLOGY: 10/30/10  Diagnosis  Colon, segmental resection, colon and anastomosis  - SMALL BOWEL WITH DENSE FIBROUS ADHESIONS AND FOCAL NODULAR MESENTERIC  FIBROSIS. NO EVIDENCE OF MALIGNANCY. SEE MICROSCOPIC DESCRIPTION.  - FINDINGS CONSISTENT WITH ILEOSTOMY STOMA. NO EVIDENCE OF MALIGNANCY.  Microscopic Comment  The segment of intestine shows small bowel with dense serosal fibrous adhesions, hyperemia and focal   hemorrhage. No area consistent with anastomosis is identified. Within the attached mesentery, there is a 2.5  cm area of dense fibrosis characterized by interlacing fascicles of fibrous tissue with focal areas of hyperemia  and hemorrhage. This is felt most likely to represent reactive fibrosis rather than denovo fibromatosis.  (JDP:gt, 11/01/10)  Jimmy Picket MD  Pathologist, Electronic Signature  (Case signed 11/01/2010)    ASSESSMENT:  1. Stage I Colon cancer, S/P resection by Dr. Lovell [name]. Followed with CEA every 3 months  2. Alcoholic liver disease, no longer drinking  3. Cirrhosis  4. Anemia of chronic disease  5. LLQ fistula with drainage.  PLAN:  1. I personally reviewed and went over laboratory results with the patient. 2. I personally reviewed and went over radiographic studies with the patient. 3. Rx for hydrocodone #30 provided to the patient.  4. Lab work at end of August 2013: CBC diff, CMET, CEA 5. CT of abd/pelvis with contrast at end of August (6months since prior study). 6. Encouraged the patient to follow-up with Dr. Lovell [name] as scheduled tomorrow (Gen Surg) 7. Return following CT scan for follow-up  All questions were answered. The patient knows to call the clinic with any problems, questions or concerns. We can certainly see the patient much sooner if necessary.  The patient and plan discussed with Glenford Peers, MD and he is in agreement with the aforementioned.  Cystal Shannahan

## 2011-06-06 NOTE — Patient Instructions (Signed)
ADRIENE PADULA  [id number] [birth year] Dr. Glenford Peers   [facility]  Discharge Instructions  RECOMMENDATIONS MADE BY THE CONSULTANT AND ANY TEST RESULTS WILL BE SENT TO YOUR REFERRING DOCTOR.   EXAM FINDINGS BY MD TODAY AND SIGNS AND SYMPTOMS TO REPORT TO CLINIC OR PRIMARY MD: Exam and discussion per PA. Doing ok for now.  MEDICATIONS PRESCRIBED: Hydrocodone 5/325 can take 1 every 6 hours as needed for pain.   INSTRUCTIONS GIVEN AND DISCUSSED: Other :  Report changes in bowel habits, blood in stool, etc.  SPECIAL INSTRUCTIONS/FOLLOW-UP: Lab work Needed in August, Xray Studies Needed August and Return to Clinic in August after scans.   I acknowledge that I have been informed and understand all the instructions given to me and received a copy. I do not have any more questions at this time, but understand that I may call the Specialty Clinic at [facility] at [phone] during business hours should I have any further questions or need assistance in obtaining follow-up care.    __________________________________________  _____________  __________ Signature of Patient or Authorized Representative            Date                   Time    __________________________________________ Nurse's Signature

## 2011-07-05 ENCOUNTER — Other Ambulatory Visit: Payer: Self-pay

## 2011-07-05 DIAGNOSIS — C189 Malignant neoplasm of colon, unspecified: Secondary | ICD-10-CM

## 2011-07-05 NOTE — Telephone Encounter (Signed)
Tried to call with no answer  

## 2011-07-09 ENCOUNTER — Other Ambulatory Visit: Payer: Self-pay

## 2011-07-09 DIAGNOSIS — C189 Malignant neoplasm of colon, unspecified: Secondary | ICD-10-CM

## 2011-07-10 MED ORDER — DIPHENOXYLATE-ATROPINE 2.5-0.025 MG PO TABS: 1.0000 | ORAL_TABLET | Freq: Four times a day (QID) | ORAL | Status: DC

## 2011-07-23 ENCOUNTER — Other Ambulatory Visit (HOSPITAL_COMMUNITY): Payer: Self-pay | Admitting: Oncology

## 2011-07-24 ENCOUNTER — Other Ambulatory Visit: Payer: Self-pay

## 2011-07-24 DIAGNOSIS — C189 Malignant neoplasm of colon, unspecified: Secondary | ICD-10-CM

## 2011-07-24 MED ORDER — DIPHENOXYLATE-ATROPINE 2.5-0.025 MG PO TABS: 1.0000 | ORAL_TABLET | Freq: Two times a day (BID) | ORAL | Status: DC | PRN

## 2011-07-25 ENCOUNTER — Telehealth: Payer: Self-pay | Admitting: Internal Medicine

## 2011-07-25 DIAGNOSIS — C189 Malignant neoplasm of colon, unspecified: Secondary | ICD-10-CM

## 2011-07-25 MED ORDER — DIPHENOXYLATE-ATROPINE 2.5-0.025 MG PO TABS: 1.0000 | ORAL_TABLET | Freq: Three times a day (TID) | ORAL | Status: DC | PRN

## 2011-07-25 NOTE — Telephone Encounter (Signed)
Ginger tried to call pt- LMOM, KJ refilled lomotil yesterday.

## 2011-07-25 NOTE — Telephone Encounter (Signed)
It was for the Lomotil. Pt's wife is aware and I will fax to drug store

## 2011-07-25 NOTE — Telephone Encounter (Signed)
Please verify. Is he asking for Lomotil or loperamide. Loperamide is OTC.

## 2011-07-25 NOTE — Telephone Encounter (Signed)
Lomotil #[id number], RX must be faxed.  Please verify that he means lomotil.

## 2011-07-25 NOTE — Telephone Encounter (Signed)
Patient has to take 3 to 4 pills a day of loperamide for diarrhea and hes already out of meds because last Rx was written for #30 and that didn't last he uses [facility] in [place] please advise?

## 2011-08-13 ENCOUNTER — Other Ambulatory Visit (HOSPITAL_COMMUNITY): Payer: Medicaid Other

## 2011-10-12 ENCOUNTER — Encounter (HOSPITAL_COMMUNITY): Payer: Medicaid Other | Attending: Oncology

## 2011-10-12 DIAGNOSIS — Z85038 Personal history of other malignant neoplasm of large intestine: Secondary | ICD-10-CM | POA: Insufficient documentation

## 2011-10-12 DIAGNOSIS — C189 Malignant neoplasm of colon, unspecified: Secondary | ICD-10-CM

## 2011-10-12 DIAGNOSIS — K632 Fistula of intestine: Secondary | ICD-10-CM | POA: Insufficient documentation

## 2011-10-12 DIAGNOSIS — K7460 Unspecified cirrhosis of liver: Secondary | ICD-10-CM | POA: Insufficient documentation

## 2011-10-12 DIAGNOSIS — D638 Anemia in other chronic diseases classified elsewhere: Secondary | ICD-10-CM | POA: Insufficient documentation

## 2011-10-12 DIAGNOSIS — D696 Thrombocytopenia, unspecified: Secondary | ICD-10-CM | POA: Insufficient documentation

## 2011-10-12 DIAGNOSIS — Z09 Encounter for follow-up examination after completed treatment for conditions other than malignant neoplasm: Secondary | ICD-10-CM | POA: Insufficient documentation

## 2011-10-12 LAB — COMPREHENSIVE METABOLIC PANEL
AST: 50 U/L — ABNORMAL HIGH (ref 0–37)
BUN: 15 mg/dL (ref 6–23)
CO2: 27 mEq/L (ref 19–32)
Chloride: 103 mEq/L (ref 96–112)
Creatinine, Ser: 1.2 mg/dL (ref 0.50–1.35)
GFR calc Af Amer: 75 mL/min — ABNORMAL LOW (ref >90)
GFR calc non Af Amer: 65 mL/min — ABNORMAL LOW (ref >90)
Glucose, Bld: 92 mg/dL (ref 70–99)
Total Bilirubin: 1.1 mg/dL (ref 0.3–1.2)

## 2011-10-12 LAB — CBC
MCH: 34.4 pg — ABNORMAL HIGH (ref 26.0–34.0)
MCHC: 35.7 g/dL (ref 30.0–36.0)
Platelets: 132 10*3/uL — ABNORMAL LOW (ref 150–400)
RBC: 3.72 MIL/uL — ABNORMAL LOW (ref 4.22–5.81)
RDW: 13.2 % (ref 11.5–15.5)

## 2011-10-12 LAB — DIFFERENTIAL
Basophils Relative: 0 % (ref 0–1)
Eosinophils Relative: 1 % (ref 0–5)
Monocytes Relative: 9 % (ref 3–12)
Neutro Abs: 3.4 10*3/uL (ref 1.7–7.7)
Neutrophils Relative %: 67 % (ref 43–77)

## 2011-10-12 LAB — CEA: CEA: 3.1 ng/mL (ref 0.0–5.0)

## 2011-10-12 NOTE — Progress Notes (Signed)
Labs drawn today for cbc/diff,cmp,cea 

## 2011-10-16 ENCOUNTER — Ambulatory Visit (HOSPITAL_COMMUNITY)
Admission: RE | Admit: 2011-10-16 | Discharge: 2011-10-16 | Disposition: A | Discharge status: Home / Self Care | Payer: Medicaid Other | Source: Ambulatory Visit | Attending: Oncology | Admitting: Oncology

## 2011-10-16 DIAGNOSIS — C189 Malignant neoplasm of colon, unspecified: Secondary | ICD-10-CM | POA: Insufficient documentation

## 2011-10-16 DIAGNOSIS — Z9049 Acquired absence of other specified parts of digestive tract: Secondary | ICD-10-CM | POA: Insufficient documentation

## 2011-10-16 MED ORDER — IOHEXOL 300 MG/ML  SOLN
100.0000 mL | Freq: Once | INTRAMUSCULAR | Status: AC | PRN
  Administered 2011-10-16: 100 mL via INTRAVENOUS

## 2011-10-17 ENCOUNTER — Ambulatory Visit (HOSPITAL_COMMUNITY): Payer: Medicaid Other | Admitting: Oncology

## 2011-10-19 ENCOUNTER — Encounter (HOSPITAL_COMMUNITY): Payer: Self-pay | Admitting: Oncology

## 2011-10-19 ENCOUNTER — Encounter (HOSPITAL_BASED_OUTPATIENT_CLINIC_OR_DEPARTMENT_OTHER): Payer: Medicaid Other | Admitting: Oncology

## 2011-10-19 VITALS — BP 130/87 | HR 91 | Temp 97.7°F | Resp 18 | Wt 152.6 lb

## 2011-10-19 DIAGNOSIS — D638 Anemia in other chronic diseases classified elsewhere: Secondary | ICD-10-CM

## 2011-10-19 DIAGNOSIS — D696 Thrombocytopenia, unspecified: Secondary | ICD-10-CM

## 2011-10-19 DIAGNOSIS — C189 Malignant neoplasm of colon, unspecified: Secondary | ICD-10-CM

## 2011-10-19 DIAGNOSIS — K7460 Unspecified cirrhosis of liver: Secondary | ICD-10-CM

## 2011-10-19 NOTE — Progress Notes (Signed)
Peter Reichert, MD [address] [place] [address]  1. Colon cancer     CURRENT THERAPY: Observation with CEA every 3 months  INTERVAL HISTORY: Peter [name] 61 y.o. male returns for  regular  visit for followup of  Stage I Colon cancer and anemia of chronic disease.  S/P resection by Dr. Lovell Sheehan in January 2012   I personally reviewed and went over laboratory results with the patient.  His anemia is very good at 12.8 and is stable.  He platelets are mildly thrombocytopenic, but stable as well.  Otherwise, his labs are unremarkable.  Importantly, his CEA remains within the normal limits.    I personally reviewed and went over radiographic studies with the patient.  His peritoneal implant has resolved and his previously noted retroperitoneal and left external iliac lymph nodes are smaller.    Peter [name] denies any complaints.  His fistula has not drained in over 3 weeks.  If he has issues with that, he will follow-up with Dr. Lovell Sheehan.  He is seeing Dr. Jena [name] as well who is assisting him with his loose BMs.    The patient denies that he is drinking, but his wife, who accompanies him, clandestinely reports that he is drinking.   So I spent time with him discussing the future plan which will include lab work every three months with a CBC and CEA.  We will see him back in 1 year and will intervene if necessary pending lab work results.  He knows we would be glad to see him sooner if necessary.    Past Medical History  Diagnosis Date  . Gout   . GERD (gastroesophageal reflux disease)   . Cirrhosis     ?ETOH related  . Colon cancer Dec 2011    [place]  . ARF (acute renal failure)     hospitalization Jan 2012  . H/O ETOH abuse   . 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 S/P endoscopy Dec 2011    Schatzki's ring, Grade 1 esophageal varices, antral/body erosions  . Anemia of chronic disease 08/10/2010  . Renal insufficiency 10/24/2010     has CIRRHOSIS; EPIGASTRIC PAIN; TRANSAMINASES, SERUM, ELEVATED; Personal history of colon cancer; Dehydration; Colon cancer; Anemia of chronic disease; Diarrhea; Weight loss, abnormal; Renal insufficiency; and Retroperitoneal lymphadenopathy on his problem list.     is allergic to lorazepam and percocet.  Peter [name] had no medications administered during this visit.  Past Surgical History  Procedure Date  . Colon surgery 02/24/2010    colon cancer (cecum)  . Exploratory laparotomy 03/03/2010    anastomotic leak, developed EC fistula  . Exploratory laparotomy w/ bowel resection 04/18/2010    ileostomy placed, (hx of EC fistula, anastomotic leak). About two feet of ileum removed.  . Colostomy   . Percutaneous drainage of intraabdominal abscess 07/2010 and 09/2010  . Partial colectomy 10/30/2010    Procedure: PARTIAL COLECTOMY;  Surgeon: Dalia Heading;  Location: [facility];  Service: General;  Laterality: N/A;  . Ileostomy closure 10/30/2010    Procedure: ILEOSTOMY TAKEDOWN;  Surgeon: Dalia Heading;  Location: [facility];  Service: General;  Laterality: N/A;    Denies any headaches, dizziness, double vision, fevers, chills, night sweats, nausea, vomiting, diarrhea, constipation, chest pain, heart palpitations, shortness of breath, blood in stool, black tarry stool, urinary pain, urinary burning, urinary frequency, hematuria.   PHYSICAL EXAMINATION  ECOG PERFORMANCE STATUS: 1 - Symptomatic but completely  ambulatory  Filed Vitals:   10/19/11 0934  BP: 130/87  Pulse: 91  Temp: 97.7 F (36.5 C)  Resp: 18    GENERAL:alert, no distress, well nourished, well developed, comfortable, cooperative and smiling SKIN: skin color, texture, turgor are normal, no rashes or significant lesions HEAD: Normocephalic, No masses, lesions, tenderness or abnormalities EYES: normal, Conjunctiva are pink and non-injected EARS: External ears normal OROPHARYNX:lips, buccal mucosa, and tongue normal and mucous  membranes are moist  NECK: supple, trachea midline LYMPH:  no palpable lymphadenopathy BREAST:not examined LUNGS: clear to auscultation and percussion HEART: regular rate & rhythm, no murmurs, no gallops, S1 normal and S2 normal ABDOMEN:abdomen soft, non-tender and normal bowel sounds BACK: Back symmetric, no curvature. EXTREMITIES:less then 2 second capillary refill, no joint deformities, effusion, or inflammation, no skin discoloration, no clubbing, no cyanosis  NEURO: alert & oriented x 3 with fluent speech, no focal motor/sensory deficits, gait normal   LABORATORY DATA: CBC    Component Value Date/Time   WBC 5.2 10/12/2011 0901   RBC 3.72* 10/12/2011 0901   HGB 12.8* 10/12/2011 0901   HCT 35.9* 10/12/2011 0901   PLT 132* 10/12/2011 0901   MCV 96.5 10/12/2011 0901   MCH 34.4* 10/12/2011 0901   MCHC 35.7 10/12/2011 0901   RDW 13.2 10/12/2011 0901   LYMPHSABS 1.2 10/12/2011 0901   MONOABS 0.5 10/12/2011 0901   EOSABS 0.1 10/12/2011 0901   BASOSABS 0.0 10/12/2011 0901      Chemistry      Component Value Date/Time   NA 142 10/12/2011 0901   K 3.4* 10/12/2011 0901   CL 103 10/12/2011 0901   CO2 27 10/12/2011 0901   BUN 15 10/12/2011 0901   CREATININE 1.20 10/12/2011 0901      Component Value Date/Time   CALCIUM 9.7 10/12/2011 0901   ALKPHOS 106 10/12/2011 0901   AST 50* 10/12/2011 0901   ALT 28 10/12/2011 0901   BILITOT 1.1 10/12/2011 0901    \ Lab Results  Component Value Date   CEA 3.1 10/12/2011      RADIOGRAPHIC STUDIES:  10/16/2011  *RADIOLOGY REPORT*  Clinical Data: Colon cancer  CT ABDOMEN AND PELVIS WITH CONTRAST  Technique: Multidetector CT imaging of the abdomen and pelvis was  performed following the standard protocol during bolus  administration of intravenous contrast.  Contrast: OMNIPAQUE IOHEXOL 300 MG/ML SOLN  Comparison: 04/13/2011  Findings: Lung bases are clear. No pericardial or pleural  effusion.  Cirrhotic liver is again noted. No focal liver  abnormalities  identified. The gallbladder appears normal. There is no biliary  dilatation. Normal appearance of the pancreas. The spleen is  upper limits of normal in size measuring 13 cm in craniocaudal  dimension.  The adrenal glands are both normal. Normal appearance of the  kidneys. Urinary bladder appears normal for degree of distention.  Prostate gland measures 4.7 cm in transverse dimension. The  seminal vesicles appear normal.  Again noted are prominent retroperitoneal lymph nodes. The index  periaortic lymph node measures 6.4 mm, image number 38. Previously  this measured the same. The left external iliac lymph node  measures 6.2 mm, image 79. This is compared with 10.4 mm  previously.  There is a small amount of upper abdominal ascites. Postop change  compatible with right hemicolectomy with enterocolonic anastomoses  is again identified. The anastomotic site is intact.  No abnormal fluid collections or evidence of contrast extravasation  identified at this time. The previously described abscess along the  left lateral margin of the sigmoid colon and extending to involve  the left psoas muscle has resolved. Increased soft tissue within  this area is nonspecific and may represents scarring.  The previously referenced gastrocolic nodule is no longer  visualized.  Review of the visualized bony structures is unremarkable.  IMPRESSION:  1. Previously described peritoneal implant is no longer visualized  and has resolved.  2. Previously referenced retroperitoneal and left external iliac  lymph nodes are smaller when compared with the previous exam.  3. Stable changes after right hemicolectomy with enterocolonic  anastomosis.  4. There is been near complete resolution of previously noted gas  and fluid collection along the left ileocolic region  5. Morphologic features of the liver compatible with cirrhosis and  mild portal venous hypertension.  Original Report Authenticated  By: Rosealee Albee, M.D.   PATHOLOGY: 10/30/10  Diagnosis  Colon, segmental resection, colon and anastomosis  - SMALL BOWEL WITH DENSE FIBROUS ADHESIONS AND FOCAL NODULAR MESENTERIC  FIBROSIS. NO EVIDENCE OF MALIGNANCY. SEE MICROSCOPIC DESCRIPTION.  - FINDINGS CONSISTENT WITH ILEOSTOMY STOMA. NO EVIDENCE OF MALIGNANCY.  Microscopic Comment  The segment of intestine shows small bowel with dense serosal fibrous adhesions, hyperemia and focal  hemorrhage. No area consistent with anastomosis is identified. Within the attached mesentery, there is a 2.5  cm area of dense fibrosis characterized by interlacing fascicles of fibrous tissue with focal areas of hyperemia  and hemorrhage. This is felt most likely to represent reactive fibrosis rather than denovo fibromatosis.  (JDP:gt, 11/01/10)  Jimmy Picket MD  Pathologist, Electronic Signature  (Case signed 11/01/2010)    ASSESSMENT:  1. Stage I Colon cancer, S/P resection by Dr. Lovell Sheehan. Followed with CEA every 3 months  2. Alcoholic liver disease, no longer drinking  3. Cirrhosis  4. Anemia of chronic disease  5. LLQ fistula with drainage.   PLAN:  1. I personally reviewed and went over laboratory results with the patient. 2. I personally reviewed and went over radiographic studies with the patient. 3. Lab work every 3 months: CEA 4. Lab work in 1 year: CBC diff, CMET with CEA 5. Return in 1 year for follow-up.   All questions were answered. The patient knows to call the clinic with any problems, questions or concerns. We can certainly see the patient much sooner if necessary.  The patient and plan discussed with Glenford Peers, MD and he is in agreement with the aforementioned.  KEFALAS,THOMAS

## 2011-10-19 NOTE — Patient Instructions (Signed)
Peter [name]  DOB [birth year] CSN [id number]  MRN [id number] Dr. Glenford Peers  [facility]  Discharge Instructions  RECOMMENDATIONS MADE BY THE CONSULTANT AND ANY TEST RESULTS WILL BE SENT TO YOUR REFERRING DOCTOR.    Exam good today. We really just need to watch your labs. CEA levels every 3 months. Return to see the doctor in 1 year. Report any issues/concerns to this clinic as needed.    I acknowledge that I have been informed and understand all the instructions given to me and received a copy. I do not have any more questions at this time, but understand that I may call the Specialty Clinic at [facility] at [phone] during business hours should I have any further questions or need assistance in obtaining follow-up care.    __________________________________________  _____________  __________ Signature of Patient or Authorized Representative            Date                   Time    __________________________________________ Nurse's Signature

## 2011-11-05 IMAGING — CR DG CHEST 1V PORT
1 series · 1 of 1 positions shown · non-contrast
Comparison: 03/09/2010

CLINICAL DATA: Exploratory laparotomy, leukocytosis

PORTABLE CHEST - 1 VIEW

[view not recorded]
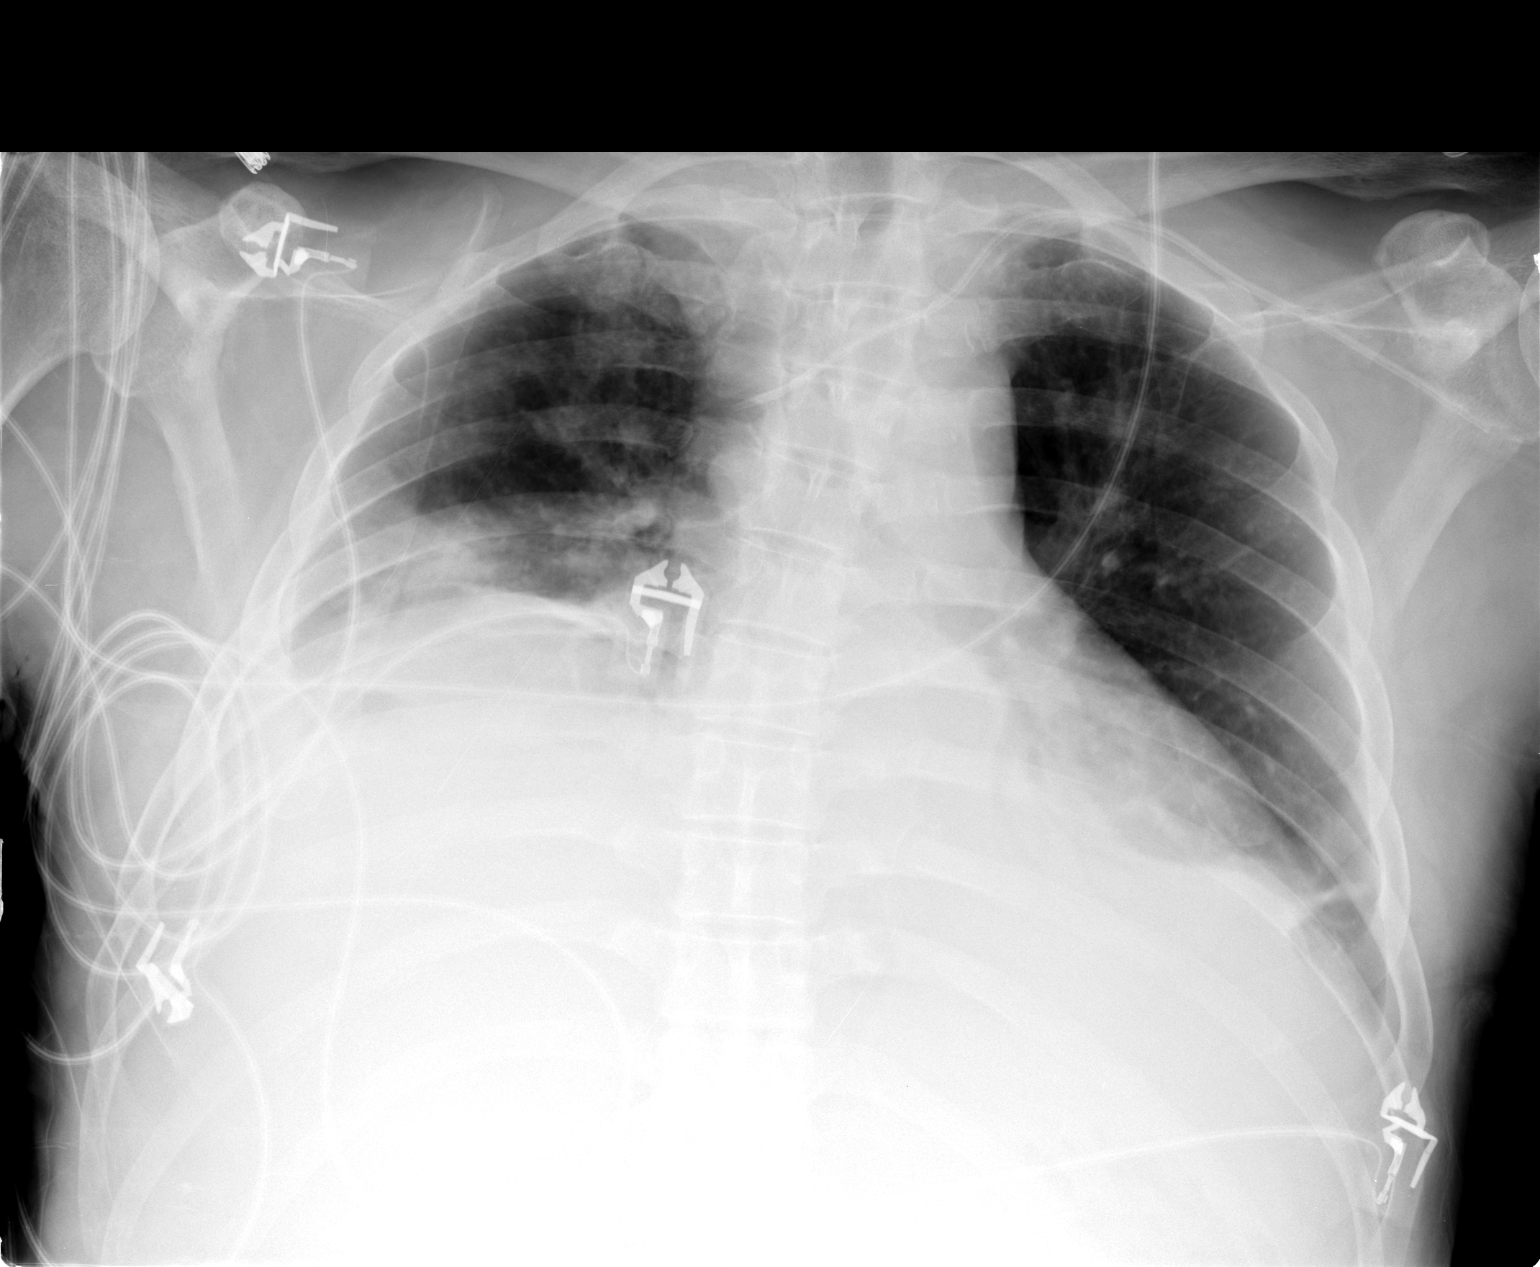

[1 of 1 positions shown; findings below may reference images not displayed]

FINDINGS: Interval extubation and NG tube removal.  Left subclavian
central line tip in the innominate venous confluence.  Low lung
volumes persist with worsening basilar atelectasis / consolidation.
Lower lobe pneumonia not excluded.  Small effusions suspected
bilaterally.  No pneumothorax or edema pattern.
IMPRESSION: Worsening basilar atelectasis / consolidation.
Small bilateral pleural effusions.

## 2011-11-06 IMAGING — CR DG CHEST 1V PORT
1 series · 1 of 1 positions shown · non-contrast
Comparison: Portable exam 9179 hours compared to 03/12/2010

CLINICAL DATA: [DATE], bowel obstruction, right side PICC line
placement

PORTABLE CHEST - 1 VIEW

[view not recorded]
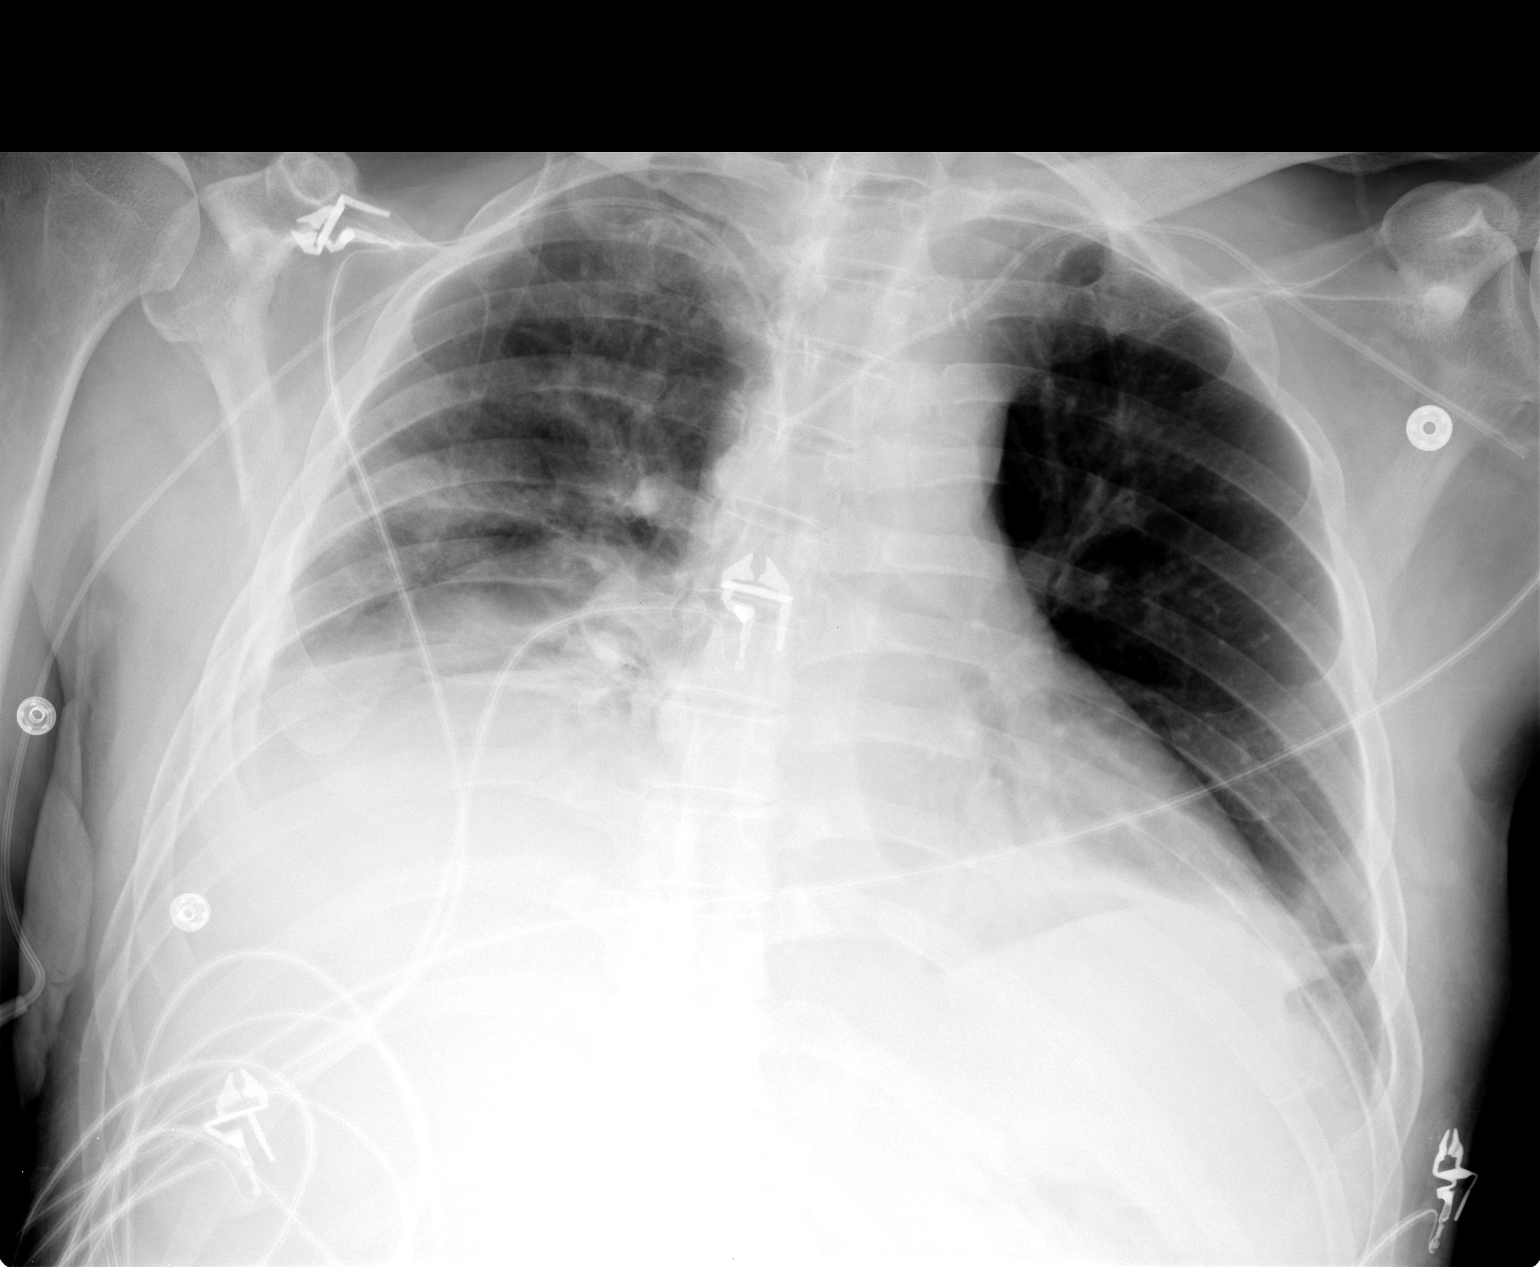

[1 of 1 positions shown; findings below may reference images not displayed]

FINDINGS: EKG leads project over chest.
Left subclavian central venous catheter, tip SVC.
Right arm PICC line, tip in right atrium, recommend withdrawal 5-6
cm for positioning in SVC near cavoatrial junction.
Heart size stable.
Low lung volumes.
Bibasilar opacification right greater than left, question
atelectasis versus consolidation, little changed.
Increased markings in right upper lobe persist, suspicious for
infiltrate.
Small right pleural effusion.
No pneumothorax.
IMPRESSION: Persistent bibasilar atelectasis versus consolidation.
Small right pleural effusion with persistent right upper lobe
infiltrate.
Tip of right arm PICC line in right atrium, recommend withdrawal 5-
6 cm as above.

## 2011-11-20 IMAGING — US IR FLUORO GUIDE CV LINE*R*
1 series · 1 of 1 positions shown · non-contrast
Comparison: None

CLINICAL DATA: Acute renal failure.  Needs access for hemodialysis.

TUNNELED HEMODIALYSIS CATHETER PLACEMENT WITH ULTRASOUND AND
FLUOROSCOPIC GUIDANCE:
TECHNIQUE: The procedure, risks, benefits, and alternatives were
explained to the patient.  Questions regarding the procedure were
encouraged and answered.  The patient understands and consents to
the procedure. Prophylactic antibiotics were administered within
one hour of procedure start time. Patency of the right IJ vein was
confirmed with ultrasound with image documentation. An appropriate
skin site was determined. Region was prepped using maximum barrier
technique including cap and mask, sterile gown, sterile gloves,
large sterile sheet, and Chlorhexidine   as cutaneous antisepsis.
The region was infiltrated locally with 1% lidocaine. Intravenous
fentanyl and Versed were administered as conscious sedation during
continuous cardiorespiratory monitoring by the radiology RN, with a
total moderate sedation time of   minutes. Under real-time
ultrasound guidance, the right IJ vein was accessed with a 21 gauge
micropuncture needle; the needle tip within the vein was confirmed
with ultrasound image documentation.   Needle exchanged over the
018 guidewire for transitional dilator, which allowed advancement
of a Benson wire into the IVC. Over this, an MPA catheter was
advanced.  An Equistream 19 hemodialysis catheter was tunneled from
the right anterior chest wall approach to the right IJ dermatotomy
site. The MPA catheter was exchanged over an Amplatz wire for
serial vascular dilators which allow placement of a peel-away
sheath, through which the catheter was advanced under intermittent
fluoroscopy, positioned with its tips in the proximal and midright
atrium. Spot chest radiograph confirms good catheter position. No
pneumothorax. Catheter was flushed and primed per protocol.
Catheter secured externally with O Prolene sutures. The right IJ
dermatotomy site was closed with   Dermabond. No immediate
complication.

[Series 1: ir us guide vasc access*right* · 1 of 1 slices shown]
[im 1/1]
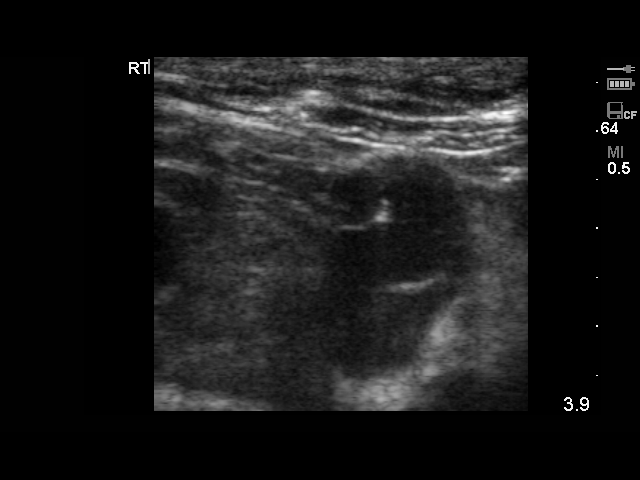

[1 of 1 positions shown; findings below may reference images not displayed]

IMPRESSION: 1. Technically successful placement of tunneled right IJ
hemodialysis catheter with ultrasound and fluoroscopic guidance.
Ready for routine use.

## 2011-11-21 ENCOUNTER — Other Ambulatory Visit: Payer: Self-pay

## 2011-11-21 DIAGNOSIS — C189 Malignant neoplasm of colon, unspecified: Secondary | ICD-10-CM

## 2011-11-21 MED ORDER — DIPHENOXYLATE-ATROPINE 2.5-0.025 MG PO TABS: 1.0000 | ORAL_TABLET | Freq: Three times a day (TID) | ORAL | Status: DC | PRN

## 2011-11-30 IMAGING — CT CT ABD-PELV W/O CM
2 of 4 series · 15 of 46 positions shown, 17 images · non-contrast
Comparison: Prior examinations 03/03/2010 and 03/21/2010.

CLINICAL DATA: Colon cancer status post right hemicolectomy last
month.  History of cirrhosis.  Renal failure.

CT ABDOMEN AND PELVIS WITHOUT CONTRAST
TECHNIQUE: Multidetector CT imaging of the abdomen and pelvis was
performed following the standard protocol without intravenous
contrast.

[Series 3: abd|pel w/o 5.0 b40f · axial · non-contrast · 0.69mm/px · z∈[-357,+83]mm · 12 of 106 slices shown, 14 images]
[im 9/106  soft-tissue]
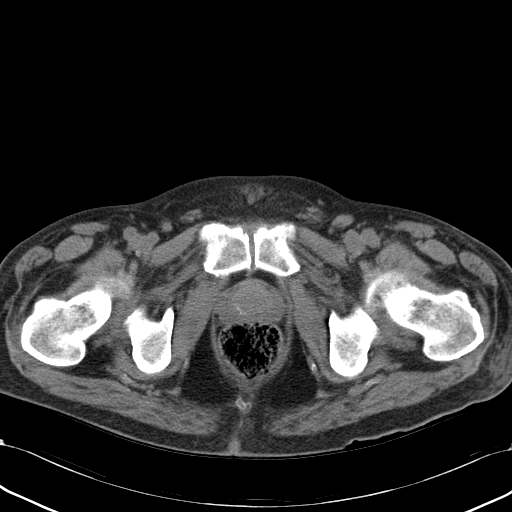
[im 9/106  bone]
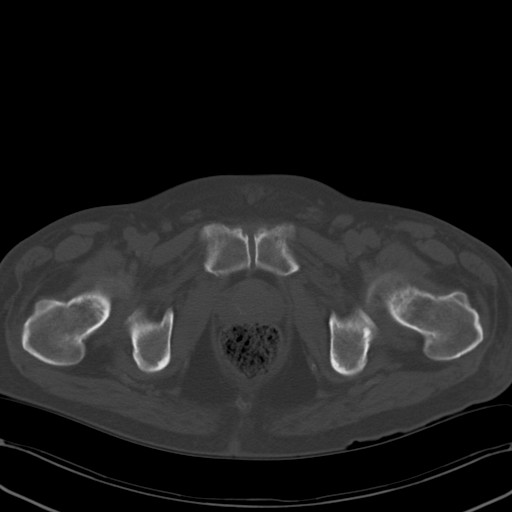
[im 17/106  soft-tissue]
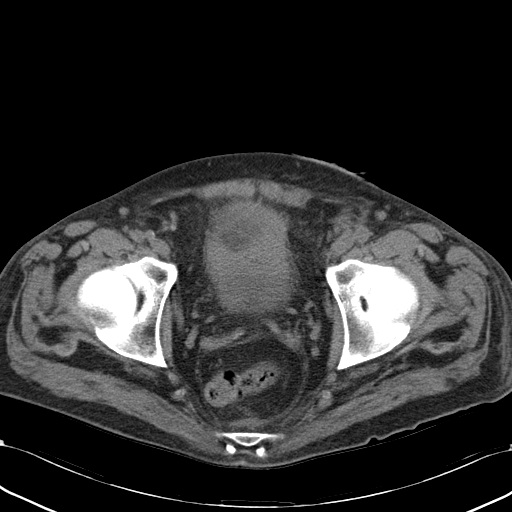
[im 25/106  soft-tissue]
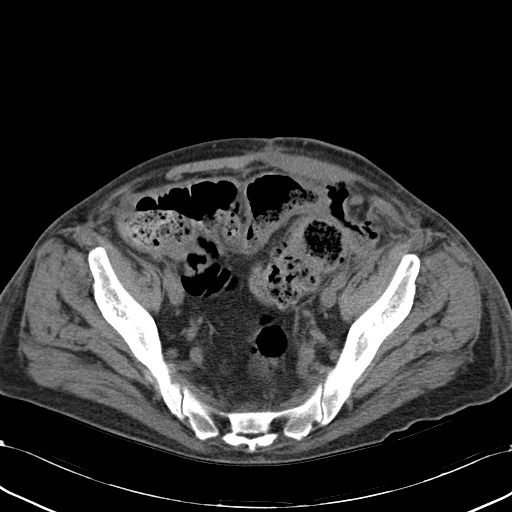
[im 33/106  soft-tissue]
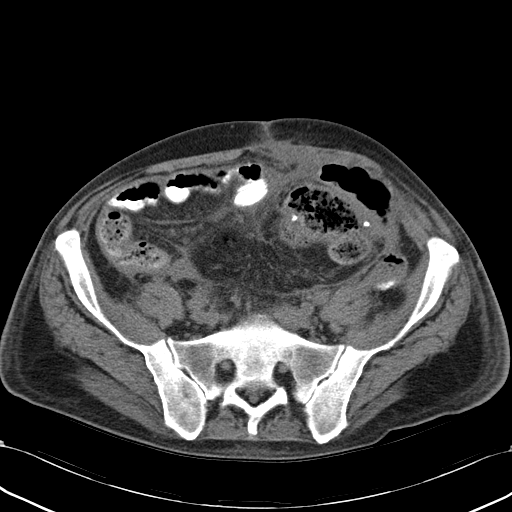
[im 41/106  soft-tissue]
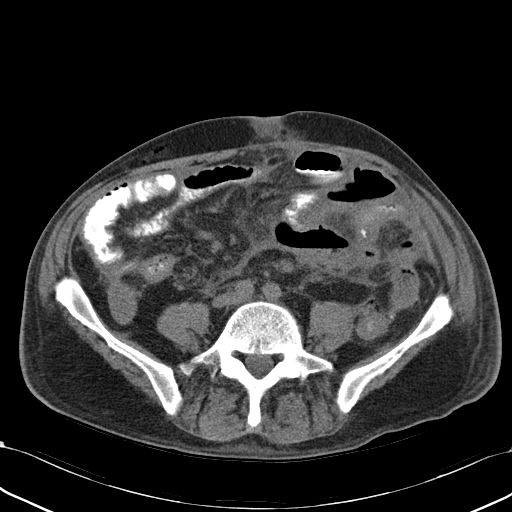
[im 49/106  soft-tissue]
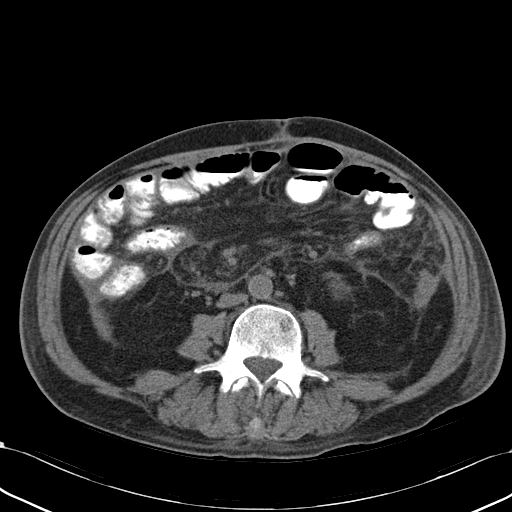
[im 57/106  soft-tissue]
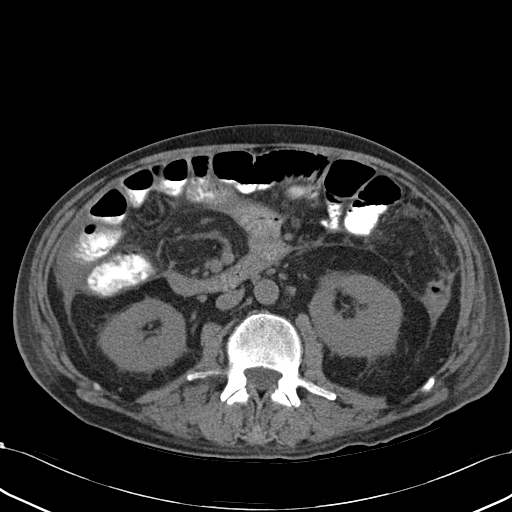
[im 65/106  soft-tissue]
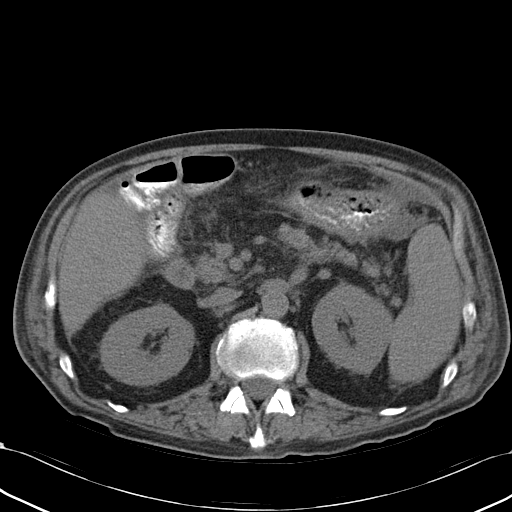
[im 73/106  soft-tissue]
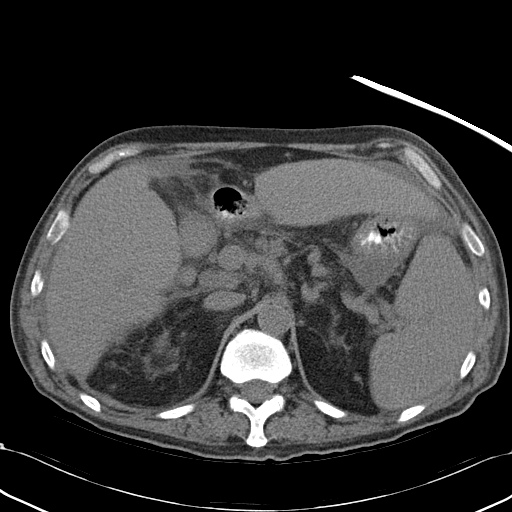
[im 73/106  bone]
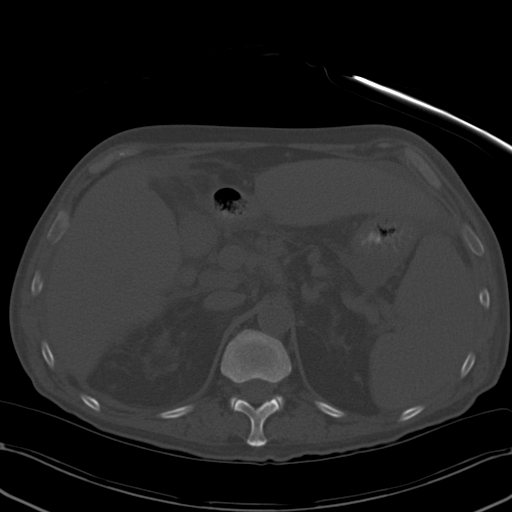
[im 81/106  soft-tissue]
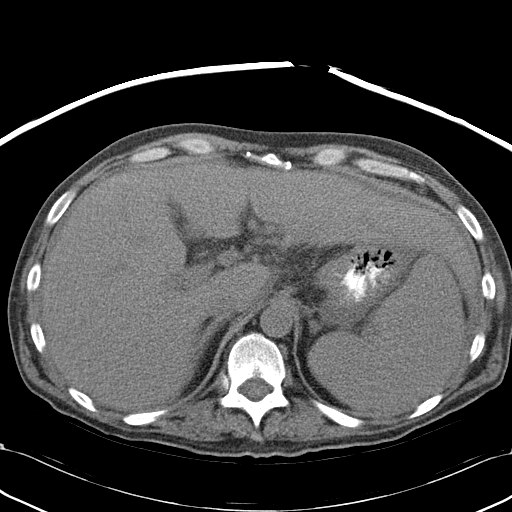
[im 89/106  soft-tissue]
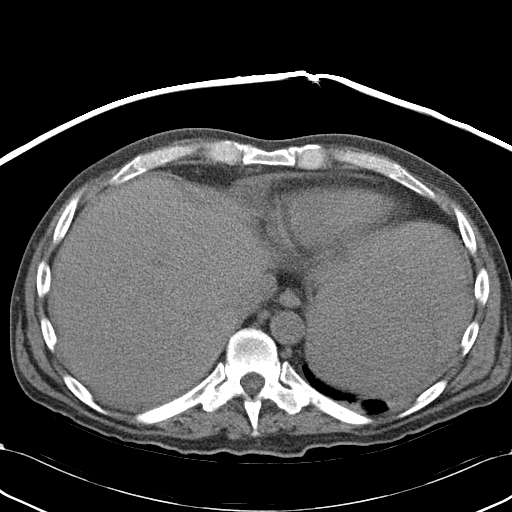
[im 97/106  soft-tissue]
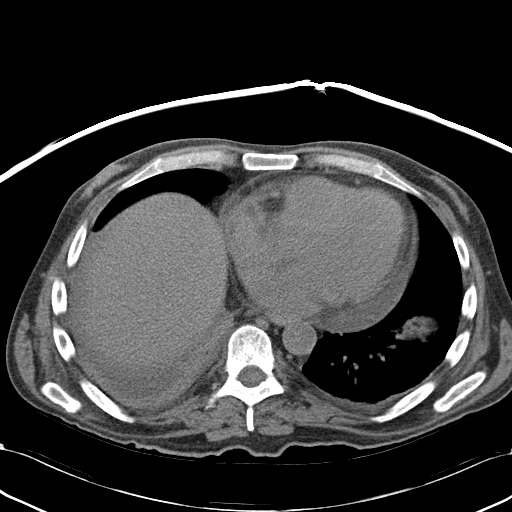

[Series 5: mpr cor (id) · coronal · 0.68mm/px · 3 of 89 slices shown]
[im 30/89  soft-tissue]
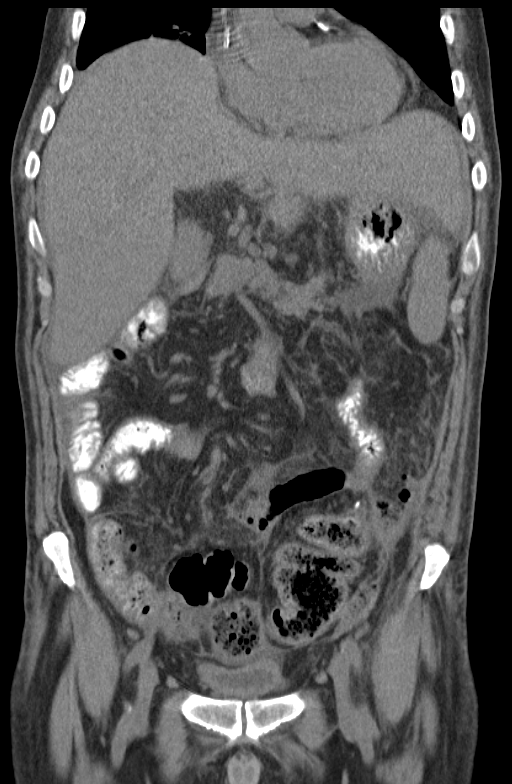
[im 40/89  soft-tissue]
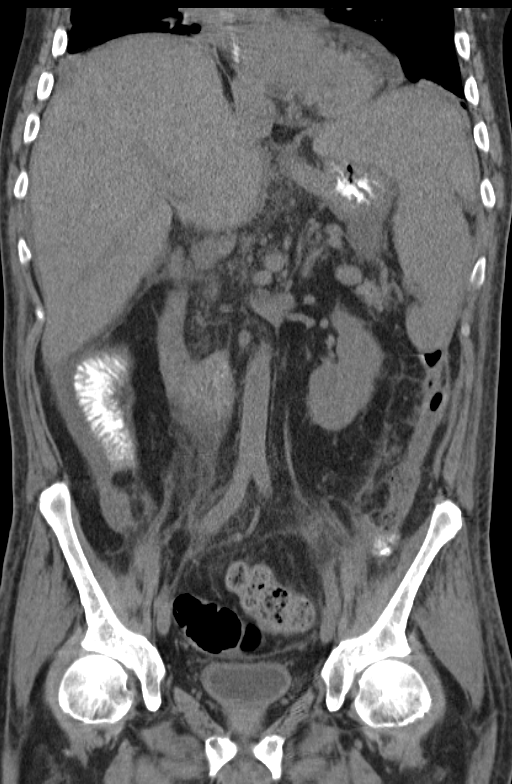
[im 49/89  soft-tissue]
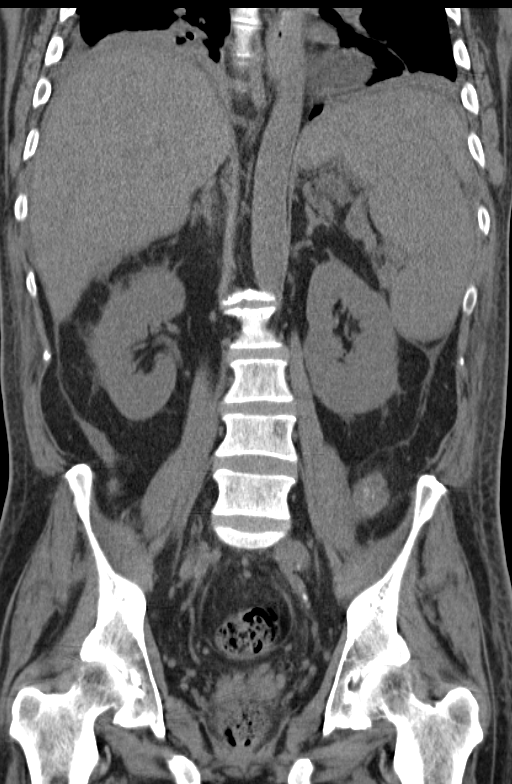

[15 of 46 positions shown; findings below may reference images not displayed]

FINDINGS: A small right pleural effusion has slightly enlarged with
worsening right lower lobe atelectasis.  Small left pleural
effusion and mild left lower lobe atelectasis have slightly
improved.  There is a stable small pericardial effusion.

As imaged in the noncontrast state, the liver, spleen, pancreas and
adrenal glands appear normal.  The gallbladder is partly contracted
without apparent change.  The kidneys appear stable with symmetric
perinephric soft tissue stranding.  There is no focal abnormality
or hydronephrosis.

A small amount of enteric contrast was administered.  This has
passed through the small bowel.  The colon is not yet opacified.
No extravasated enteric contrast is seen.  However, there is an
enlarging extraluminal air-fluid collection in the left midabdomen
mesentery, just superior and posterior to the ileocolonic
anastomosis.  This now measures up to 5.3 cm on image 66.  There is
some additional extraluminal air and fluid along the tract of the
surgical drain which has been removed in the interval.  Air extends
through the subcutaneous fat of the anterior abdominal wall in the
left lower quadrant (images 85 - 87) suspicious for a cutaneous
fistula.  There is diffuse mesenteric edema which is similar to the
prior study.  The overall volume of ascites has minimally improved,
especially in the left upper quadrant.  No focal extraluminal fluid
collection is identified.  There is no evidence of bowel
obstruction.

The urinary bladder and prostate gland appear unremarkable.  There
are no acute osseous findings.  Right femoral line has been
removed.
IMPRESSION: 1.  Increased extraluminal air fluid collections adjacent to the
ileocolonic anastomosis and removed surgical drain highly
concerning for anastomotic leak and possible cutaneous fistula
formation.  The enteric contrast has not passed through the
anastomosis. There is no large drainable fluid collection.
2.  Generalized mesenteric edema with mild ascites, slightly
improved compared with the prior study.
3.  No evidence of bowel obstruction.
4.  Bibasilar atelectasis with small bilateral pleural effusions
and small pericardial effusion.

Findings were discussed by telephone with Dr. Geiger at 5104 hours
on 04/06/2010.

## 2012-01-04 ENCOUNTER — Telehealth: Payer: Self-pay | Admitting: Gastroenterology

## 2012-01-04 NOTE — Telephone Encounter (Signed)
Overdue lab results. He has had numerous labs with Dr. Dorise Hiss but still needs to have the follow:  Ferritin AFP tumor marker Also should schedule OV with [facility] for f/u, first available.

## 2012-01-11 ENCOUNTER — Other Ambulatory Visit: Payer: Self-pay | Admitting: Gastroenterology

## 2012-01-11 ENCOUNTER — Other Ambulatory Visit: Payer: Self-pay

## 2012-01-11 DIAGNOSIS — K7460 Unspecified cirrhosis of liver: Secondary | ICD-10-CM

## 2012-01-11 NOTE — Telephone Encounter (Signed)
Letter and lab order mailed to pt. 

## 2012-01-14 ENCOUNTER — Encounter (HOSPITAL_COMMUNITY): Payer: Medicaid Other | Attending: Oncology

## 2012-01-14 DIAGNOSIS — K7460 Unspecified cirrhosis of liver: Secondary | ICD-10-CM | POA: Insufficient documentation

## 2012-01-14 DIAGNOSIS — C180 Malignant neoplasm of cecum: Secondary | ICD-10-CM

## 2012-01-14 DIAGNOSIS — C189 Malignant neoplasm of colon, unspecified: Secondary | ICD-10-CM | POA: Insufficient documentation

## 2012-01-14 LAB — CEA: CEA: 2.9 ng/mL (ref 0.0–5.0)

## 2012-01-14 LAB — CBC
Hemoglobin: 13.2 g/dL (ref 13.0–17.0)
MCH: 33.5 pg (ref 26.0–34.0)
MCHC: 34.7 g/dL (ref 30.0–36.0)
Platelets: 126 10*3/uL — ABNORMAL LOW (ref 150–400)
RDW: 13.1 % (ref 11.5–15.5)

## 2012-01-14 NOTE — Progress Notes (Signed)
Labs drawn today for cea,cbc 

## 2012-01-29 LAB — FERRITIN: Ferritin: 449 ng/mL — ABNORMAL HIGH (ref 22–322)

## 2012-01-30 LAB — AFP TUMOR MARKER: AFP-Tumor Marker: 4.9 ng/mL (ref 0.0–8.0)

## 2012-02-01 NOTE — Progress Notes (Signed)
Quick Note:  Ferritin better. AFP normal/stable. Needs OV with [facility] next available for f/u cirrhosis/colon cancer. ______

## 2012-03-21 ENCOUNTER — Encounter: Payer: Self-pay | Admitting: Internal Medicine

## 2012-03-21 ENCOUNTER — Encounter (HOSPITAL_COMMUNITY): Payer: Self-pay | Admitting: Pharmacy Technician

## 2012-03-21 ENCOUNTER — Ambulatory Visit (INDEPENDENT_AMBULATORY_CARE_PROVIDER_SITE_OTHER): Payer: Medicaid Other | Admitting: Internal Medicine

## 2012-03-21 VITALS — BP 138/80 | HR 63 | Temp 97.4°F | Ht 68.0 in | Wt 159.2 lb

## 2012-03-21 DIAGNOSIS — Z85038 Personal history of other malignant neoplasm of large intestine: Secondary | ICD-10-CM

## 2012-03-21 DIAGNOSIS — K7460 Unspecified cirrhosis of liver: Secondary | ICD-10-CM

## 2012-03-21 MED ORDER — PEG 3350-KCL-NA BICARB-NACL 420 G PO SOLR: 4000.0000 mL | ORAL | Status: DC

## 2012-03-21 NOTE — Patient Instructions (Signed)
Go to the health department for Hepatitis A and B vaccines  Schedule a surveillance colonoscopy now - history of colon cancer

## 2012-03-21 NOTE — Progress Notes (Signed)
i

## 2012-03-21 NOTE — Progress Notes (Signed)
Primary Care Physician:  [name], MD Primary Gastroenterologist:  Dr. Jena Gauss  Pre-Procedure History & Physical: HPI:  Peter [name] is a 62 y.o. male here for followup. History of colon cancer status post right hemicolectomy, postoperative course complicated by a anastomotic leak and intra-abdominal abscess requiring recurrent reoperation and subsequent percutaneous drainage. Ileostomy has been taken down. He had protracted fistula drainage which finally taper off last year. He has 3-4 nonbloody loose bowel movements daily -- which he states is near his baseline. He does take 2 Lomotil as daily on occasion for exacerbation of loose stools. No nausea, vomiting, early satiety . Takes Protonix necessary for occasional reflux. Please note, he has not had his one-year surveillance colonoscopy. It's been a  good 2 years since he had his cancer resected. He is overdue for surveillance. He has a history of grade 1 esophageal varices. 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 He has not been vaccinated against hepatitis A and B.  Past Medical History  Diagnosis Date  . Gout   . GERD (gastroesophageal reflux disease)   . Cirrhosis     ?ETOH related, afp 02/05/12= 4.9, ct on 10/16/11,no  Hep A/B vaccines  . Colon cancer Dec 2011    Cecum  . ARF (acute renal failure)     hospitalization Jan 2012  . H/O ETOH abuse   . 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 S/P endoscopy Dec 2011    Schatzki's ring, Grade 1 esophageal varices, antral/body erosions  . Anemia of chronic disease 08/10/2010  . Renal insufficiency 10/24/2010  . Schatzki's ring   . Varices, esophageal     Past Surgical History  Procedure Date  . Colon surgery 02/24/2010    colon cancer (cecum)  . Exploratory laparotomy 03/03/2010    anastomotic leak, developed EC fistula  . Exploratory laparotomy w/  bowel resection 04/18/2010    ileostomy placed, (hx of EC fistula, anastomotic leak). About two feet of ileum removed.  . Colostomy   . Percutaneous drainage of intraabdominal abscess 07/2010 and 09/2010  . Partial colectomy 10/30/2010    Procedure: PARTIAL COLECTOMY;  Surgeon: Dalia Heading;  Location: [facility];  Service: General;  Laterality: N/A;  . Ileostomy closure 10/30/2010    Procedure: ILEOSTOMY TAKEDOWN;  Surgeon: Dalia Heading;  Location: [facility];  Service: General;  Laterality: N/A;  . Colonoscopy Dec 2011    cecal mass, tubulovillous adenoma at splenic flexure  . Esophagogastroduodenoscopy Dec 2011    Schatzki's ring, Grade 1 esophageal varices, antral/body erosions    Prior to Admission medications   Medication Sig Start Date End Date Taking? Authorizing Provider  ALPRAZolam (XANAX) 0.25 MG tablet Take 0.25 mg by mouth 2 (two) times daily as needed. For anxiety     Yes Historical Provider, MD  diphenoxylate-atropine (LOMOTIL) 2.5-0.025 MG per tablet Take 1 tablet by mouth 3 (three) times daily as needed for diarrhea or loose stools. 11/21/11  Yes Tiffany Kocher, PA  MILK THISTLE PO Take 240 mg by mouth daily.   Yes Historical Provider, MD  pantoprazole (PROTONIX) 40 MG tablet Take 40 mg by mouth 2 (two) times daily.    Yes Historical Provider, MD  simethicone (MYLICON) 125 MG chewable tablet Chew 125 mg by mouth every 6 (six) hours as needed.   Yes Historical Provider, MD  HYDROcodone-acetaminophen [facility]) 5-325  MG per tablet Take 1 tablet by mouth every 6 (six) hours as needed. For pain 06/06/11   Ellouise Newer, PA  loperamide (IMODIUM) 2 MG capsule Take 2 mg by mouth 4 (four) times daily as needed.      Historical Provider, MD  promethazine (PHENERGAN) 25 MG tablet Take 25 mg by mouth every 4 (four) hours as needed.      Historical Provider, MD    Allergies as of 03/21/2012 - Review Complete 03/21/2012  Allergen Reaction Noted  . Lorazepam Other (See Comments) 08/01/2010  .  Percocet (oxycodone-acetaminophen) Itching and Nausea Only 08/01/2010    Family History  Problem Relation Age of Onset  . Cirrhosis Father     deceased, secondary to ETOH    History   Social History  . Marital Status: Married    Spouse Name: N/A    Number of Children: N/A  . Years of Education: N/A   Occupational History  . Not on file.   Social History Main Topics  . Smoking status: Never Smoker   . Smokeless tobacco: Not on file  . Alcohol Use: No     Comment: in the past  . Drug Use: No  . Sexually Active: Not on file   Other Topics Concern  . Not on file   Social History Narrative  . No narrative on file    Review of Systems: See HPI, otherwise negative ROS  Physical Exam: BP 138/80  Pulse 63  Temp 97.4 F (36.3 C) (Oral)  Ht 5\' 8"  (1.727 m)  Wt 159 lb 3.2 oz (72.213 kg)  BMI 24.21 kg/m2 General:   Alert,  somewhat disheveled bearded gentleman alert conversant in no acute distress. He is. Pleasant. He is accompanied by his wife. Skin:  Intact without significant lesions or rashes. Eyes:  Sclera clear, no icterus.   Conjunctiva pink. Ears:  Normal auditory acuity. Nose:  No deformity, discharge,  or lesions. Mouth:  No deformity or lesions. Neck:  Supple; no masses or thyromegaly. No significant cervical adenopathy. Lungs:  Clear throughout to auscultation.   No wheezes, crackles, or rhonchi. No acute distress. Heart:  Regular rate and rhythm; no murmurs, clicks, rubs,  or gallops. Abdomen: Multiple scars with keloid formation. He has ileostomy scar right upper quadrant. Positive bowel sounds soft nontender. He may have a hernia in the area of the iliac former ileostomy site. Pulses:  Normal pulses noted. Extremities:  Without clubbing or edema.  Impression/Plan:  62 year old gentleman cirrhosis status post right colectomy for colorectal cancer- Had high-grade dysplasia in a removed splenic flexure polyp in 2011 as well. Has not had a followup surveillance  colonoscopy. This needs to be done. History of grade 1 esophageal varices secondary to hepatic cirrhosis felt to be secondary to alcohol.    Recommendations:  Proceed with a surveillance colonoscopy this time.The risks, benefits, limitations, alternatives and imponderables have been reviewed with the patient. Questions have been answered. All parties are agreeable. Patient will get hepatitis A and B. vaccination from the health Department. We'll consider repeat EGD to reassess varices later in the year i.e. 6 months from now. He may need primary prophylaxis with a nonspecific beta blocker initiated in the near future.  Further recommendations to follow the

## 2012-03-28 ENCOUNTER — Encounter: Payer: Self-pay | Admitting: Internal Medicine

## 2012-04-02 ENCOUNTER — Ambulatory Visit (HOSPITAL_COMMUNITY)
Admission: RE | Admit: 2012-04-02 | Discharge: 2012-04-02 | Disposition: A | Discharge status: Home / Self Care | Payer: Medicaid Other | Source: Ambulatory Visit | Attending: Internal Medicine | Admitting: Internal Medicine

## 2012-04-02 ENCOUNTER — Encounter (HOSPITAL_COMMUNITY)
Admission: RE | Disposition: A | Discharge status: Home / Self Care | Payer: Self-pay | Source: Ambulatory Visit | Attending: Internal Medicine

## 2012-04-02 ENCOUNTER — Encounter (HOSPITAL_COMMUNITY): Payer: Self-pay | Admitting: *Deleted

## 2012-04-02 DIAGNOSIS — Z85038 Personal history of other malignant neoplasm of large intestine: Secondary | ICD-10-CM | POA: Insufficient documentation

## 2012-04-02 DIAGNOSIS — Z09 Encounter for follow-up examination after completed treatment for conditions other than malignant neoplasm: Secondary | ICD-10-CM | POA: Insufficient documentation

## 2012-04-02 DIAGNOSIS — Z9049 Acquired absence of other specified parts of digestive tract: Secondary | ICD-10-CM | POA: Insufficient documentation

## 2012-04-02 DIAGNOSIS — Z1211 Encounter for screening for malignant neoplasm of colon: Secondary | ICD-10-CM

## 2012-04-02 HISTORY — PX: COLONOSCOPY: SHX5424

## 2012-04-02 SURGERY — COLONOSCOPY
Anesthesia: Moderate Sedation

## 2012-04-02 MED ORDER — MIDAZOLAM HCL 5 MG/5ML IJ SOLN
INTRAMUSCULAR | Status: AC
  Filled 2012-04-02: qty 10

## 2012-04-02 MED ORDER — MEPERIDINE HCL 100 MG/ML IJ SOLN
INTRAMUSCULAR | Status: AC
  Filled 2012-04-02: qty 1

## 2012-04-02 MED ORDER — MIDAZOLAM HCL 5 MG/5ML IJ SOLN
INTRAMUSCULAR | Status: DC | PRN
  Administered 2012-04-02: 2 mg via INTRAVENOUS
  Administered 2012-04-02: 1 mg via INTRAVENOUS
  Administered 2012-04-02: 2 mg via INTRAVENOUS

## 2012-04-02 MED ORDER — ONDANSETRON HCL 4 MG/2ML IJ SOLN
INTRAMUSCULAR | Status: AC
  Filled 2012-04-02: qty 2

## 2012-04-02 MED ORDER — STERILE WATER FOR IRRIGATION IR SOLN
Status: DC | PRN
  Administered 2012-04-02: 10:00:00

## 2012-04-02 MED ORDER — ONDANSETRON HCL 4 MG/2ML IJ SOLN
INTRAMUSCULAR | Status: DC | PRN
  Administered 2012-04-02: 4 mg via INTRAVENOUS

## 2012-04-02 MED ORDER — MEPERIDINE HCL 100 MG/ML IJ SOLN
INTRAMUSCULAR | Status: DC | PRN
  Administered 2012-04-02: 25 mg via INTRAVENOUS
  Administered 2012-04-02: 50 mg via INTRAVENOUS

## 2012-04-02 NOTE — Interval H&P Note (Signed)
History and Physical Interval Note:  04/02/2012 9:39 AM  Peter [name]  has presented today for surgery, with the diagnosis of HISTORY OF COLON RECTAL CANCER AND CIRRHOSIS  The various methods of treatment have been discussed with the patient and family. After consideration of risks, benefits and other options for treatment, the patient has consented to  Procedure(s) with comments: COLONOSCOPY (N/A) - 9:30 as a surgical intervention .  The patient's history has been reviewed, patient examined, no change in status, stable for surgery.  I have reviewed the patient's chart and labs.  Questions were answered to the patient's satisfaction.     Peter [name]   Colonoscopy per plan.The risks, benefits, limitations, alternatives and imponderables have been reviewed with the patient. Questions have been answered. All parties are agreeable.

## 2012-04-02 NOTE — H&P (View-Only) (Signed)
i

## 2012-04-02 NOTE — Op Note (Signed)
[facility] [address] [place], [address]   COLONOSCOPY PROCEDURE REPORT  PATIENT: Peter [name], Peter [name]  MR#:         [id number] BIRTHDATE: [birth year] , 61  yrs. old GENDER: Male ENDOSCOPIST: R.  Roetta Sessions, MD FACP FACG REFERRED BY:  Butch Penny, M.D. PROCEDURE DATE:  04/02/2012 PROCEDURE:     Surveillance colonoscopy  INDICATIONS: First surveillance colonoscopy after right hemicolectomy for colorectal cancer  INFORMED CONSENT:  The risks, benefits, alternatives and imponderables including but not limited to bleeding, perforation as well as the possibility of a missed lesion have been reviewed.  The potential for biopsy, lesion removal, etc. have also been discussed.  Questions have been answered.  All parties agreeable. Please see the history and physical in the medical record for more information.  MEDICATIONS: Versed 5 mg IV and Demerol 75 mg IV in divided doses. Zofran 4 mg IV  DESCRIPTION OF PROCEDURE:  After a digital rectal exam was performed, the Pentax Colonoscope [id number]  colonoscope was advanced from the anus through the rectum and colon to the area of the cecum, ileocecal valve and appendiceal orifice.  The cecum was deeply intubated.  These structures were well-seen and photographed for the record.  From the level of the cecum and ileocecal valve, the scope was slowly and cautiously withdrawn.  The mucosal surfaces were carefully surveyed utilizing scope tip deflection to facilitate fold flattening as needed.  The scope was pulled down into the rectum where a thorough examination including retroflexion was performed.    FINDINGS:  Adequate preparation. Normal rectum. Normal-appearing residual colonic mucosa  THERAPEUTIC / DIAGNOSTIC MANEUVERS PERFORMED:  None  COMPLICATIONS: None  CECAL WITHDRAWAL TIME:  not applicable  IMPRESSION:  Normal residual rectal and colonic mucosa  RECOMMENDATIONS: Repeat surveillance colonoscopy in 3  years. Patient will followup with [place] in 3 months to reassess varices. Will need EGD later in the year.   _______________________________ eSigned:  R. Roetta Sessions, MD FACP [facility] 04/02/2012 10:14 AM   CC:

## 2012-04-07 ENCOUNTER — Encounter (HOSPITAL_COMMUNITY): Payer: Self-pay | Admitting: Internal Medicine

## 2012-04-14 ENCOUNTER — Encounter (HOSPITAL_COMMUNITY): Payer: Medicaid Other | Attending: Oncology

## 2012-04-14 ENCOUNTER — Other Ambulatory Visit (HOSPITAL_COMMUNITY): Payer: Self-pay | Admitting: Oncology

## 2012-04-14 DIAGNOSIS — C189 Malignant neoplasm of colon, unspecified: Secondary | ICD-10-CM

## 2012-04-14 DIAGNOSIS — D638 Anemia in other chronic diseases classified elsewhere: Secondary | ICD-10-CM | POA: Insufficient documentation

## 2012-04-14 DIAGNOSIS — K7460 Unspecified cirrhosis of liver: Secondary | ICD-10-CM | POA: Insufficient documentation

## 2012-04-14 LAB — CBC
Hemoglobin: 12.9 g/dL — ABNORMAL LOW (ref 13.0–17.0)
MCH: 33.2 pg (ref 26.0–34.0)
MCHC: 34.6 g/dL (ref 30.0–36.0)
Platelets: 122 10*3/uL — ABNORMAL LOW (ref 150–400)
RDW: 13.4 % (ref 11.5–15.5)

## 2012-04-14 LAB — CEA: CEA: 3.4 ng/mL (ref 0.0–5.0)

## 2012-04-14 LAB — VITAMIN B12: Vitamin B-12: 468 pg/mL (ref 211–911)

## 2012-04-14 NOTE — Progress Notes (Signed)
Labs drawn today for cbc,cea,b12

## 2012-05-02 ENCOUNTER — Other Ambulatory Visit (HOSPITAL_COMMUNITY): Payer: Self-pay | Admitting: Oncology

## 2012-06-26 ENCOUNTER — Encounter: Payer: Self-pay | Admitting: Internal Medicine

## 2012-06-30 ENCOUNTER — Telehealth: Payer: Self-pay | Admitting: Gastroenterology

## 2012-06-30 ENCOUNTER — Ambulatory Visit: Payer: Medicaid Other | Admitting: Gastroenterology

## 2012-06-30 NOTE — Telephone Encounter (Signed)
Pt was a no show

## 2012-07-03 NOTE — Telephone Encounter (Signed)
Offer to reschedule OV. Patient needs EGD to evaluate varices sometime in the next several months.

## 2012-07-07 ENCOUNTER — Encounter (HOSPITAL_COMMUNITY): Payer: Medicaid Other | Attending: Hematology and Oncology

## 2012-07-07 DIAGNOSIS — C189 Malignant neoplasm of colon, unspecified: Secondary | ICD-10-CM | POA: Insufficient documentation

## 2012-07-07 DIAGNOSIS — K7460 Unspecified cirrhosis of liver: Secondary | ICD-10-CM

## 2012-07-07 LAB — CBC
HCT: 36.4 % — ABNORMAL LOW (ref 39.0–52.0)
MCHC: 34.1 g/dL (ref 30.0–36.0)
Platelets: 99 10*3/uL — ABNORMAL LOW (ref 150–400)
RDW: 13.5 % (ref 11.5–15.5)
WBC: 3.8 10*3/uL — ABNORMAL LOW (ref 4.0–10.5)

## 2012-07-07 LAB — CEA: CEA: 3.1 ng/mL (ref 0.0–5.0)

## 2012-07-07 NOTE — Progress Notes (Signed)
Labs drawn today for cea,cbc

## 2012-07-09 ENCOUNTER — Encounter: Payer: Self-pay | Admitting: Gastroenterology

## 2012-07-09 NOTE — Telephone Encounter (Signed)
Mailed letter to patient to call our office to [facility]

## 2012-07-24 ENCOUNTER — Ambulatory Visit (INDEPENDENT_AMBULATORY_CARE_PROVIDER_SITE_OTHER): Payer: Medicaid Other | Admitting: Gastroenterology

## 2012-07-24 ENCOUNTER — Encounter: Payer: Self-pay | Admitting: Gastroenterology

## 2012-07-24 ENCOUNTER — Other Ambulatory Visit: Payer: Self-pay | Admitting: Internal Medicine

## 2012-07-24 VITALS — BP 118/67 | HR 72 | Temp 98.2°F | Ht 68.0 in | Wt 160.0 lb

## 2012-07-24 DIAGNOSIS — K219 Gastro-esophageal reflux disease without esophagitis: Secondary | ICD-10-CM

## 2012-07-24 DIAGNOSIS — K7030 Alcoholic cirrhosis of liver without ascites: Secondary | ICD-10-CM

## 2012-07-24 DIAGNOSIS — I8500 Esophageal varices without bleeding: Secondary | ICD-10-CM | POA: Insufficient documentation

## 2012-07-24 DIAGNOSIS — K529 Noninfective gastroenteritis and colitis, unspecified: Secondary | ICD-10-CM

## 2012-07-24 DIAGNOSIS — F1011 Alcohol abuse, in remission: Secondary | ICD-10-CM

## 2012-07-24 DIAGNOSIS — R197 Diarrhea, unspecified: Secondary | ICD-10-CM

## 2012-07-24 DIAGNOSIS — C189 Malignant neoplasm of colon, unspecified: Secondary | ICD-10-CM

## 2012-07-24 MED ORDER — PANTOPRAZOLE SODIUM 40 MG PO TBEC: 40.0000 mg | DELAYED_RELEASE_TABLET | Freq: Every day | ORAL | Status: DC

## 2012-07-24 MED ORDER — DIPHENOXYLATE-ATROPINE 2.5-0.025 MG PO TABS: 1.0000 | ORAL_TABLET | Freq: Three times a day (TID) | ORAL | Status: DC | PRN

## 2012-07-24 NOTE — Assessment & Plan Note (Addendum)
Next colonoscopy due in February 2017.  Patient continues to complain of poor healing of ileostomy site. He has also had recent significant bleeding associated with pus from his lower abdominal midline incision. I do not feel any significant abnormalities on exam. I will discuss further with Dr. Jena Gauss. Not clear whether he needs to have further imaging versus referral back to Dr. Lovell Sheehan.

## 2012-07-24 NOTE — Assessment & Plan Note (Signed)
Due for hepatitis A and B. vaccinations however patient has been noncompliant in the past. EGD with esophageal variceal banding if needed, history of grade 1 varices previously in 2011. Long discussion with patient regarding need to quit drinking. He is not interested at this time, does not feel he has a physical dependency on it. Discussed effects of alcohol on his liver. Due for lab work at this time as ordered.

## 2012-07-24 NOTE — Assessment & Plan Note (Signed)
Refill on Lomotil to use sparingly.

## 2012-07-24 NOTE — Patient Instructions (Addendum)
1. We have scheduled you for an upper endoscopy with possible esophageal variceal banding as discussed. Please see separate instructions. 2. I have refilled Lomotil and Protonix. 3. You are due for lab work to followup cirrhosis. 4. Please have hepatitis A and B. vaccinations as previously requested. 5. I will discuss the drainage you are having from your prior incisions with Dr. Jena Gauss. Further recommendations to follow. 6. You need to taper off the alcohol with goal of completely stopping. You liver does not need any further damage. If you need any assistance, please let us know.

## 2012-07-24 NOTE — Assessment & Plan Note (Signed)
Due for EGD with possible esophageal variceal banding if needed.  I have discussed the risks, alternatives, benefits with regards to but not limited to the risk of reaction to medication, bleeding, infection, perforation and the patient is agreeable to proceed. Written consent to be obtained.

## 2012-07-24 NOTE — Assessment & Plan Note (Signed)
Refill protonix, but only once daily to see how he does on that.

## 2012-07-24 NOTE — Progress Notes (Signed)
Primary Care Physician:  MCINNIS,ANGUS G, MD  Primary Gastroenterologist:  Michael Rourk, MD   Chief Complaint  Patient presents with  . Follow-up  . Medication Refill    HPI:  Peter [name] is a 62 y.o. male here to schedule EGD for surveillance of esophageal varices. He had a colonoscopy since his last office visit (unremarkable). He has h/o colon cancer status post right hemicolectomy, postoperative course complicated by anastomotic leak and intra-abdominal abscess requiring recurrent reoperation and subsequent percutaneous drainage. Ileostomy has been taken down. He has a history of protracted fistula drainage which had tapered off last year. See past surgical history for details.   No melena, brbpr. Takes Lomotil one pill about every day. If doesn't take it then, increased diarrhea. Appetite fair. No heartburn, dysphagia. Heartburn controlled with protonix but currently out. Weight stable. C/O some bleeding (puddle of blood) and then pus from lower midline abdominal incision about 6 weeks ago. This was not location of prior fistula per patient. According to the wife, they called Dr. Ziegler on call and he advised to watch and call for appointment. Patient did not make f/u appointment. He reports drainage about once per month. Also notes scabbing over center of prior ileostomy site, "just doesn't want to heal."  Discussed his ongoing alcohol consumption. He really will not quantify how frequently or how often he drinks. He does admit that he drinks more days then he does not. "Depending on what's going on". He does not feel he has a dependency on alcohol. He is not interested in quitting. He acknowledges that his father died from alcoholic cirrhosis. He still has not had his vaccinations for hepatitis A and B., he has been noncompliant. Discussed this with patient as well today.   Current Outpatient Prescriptions  Medication Sig Dispense Refill  . diphenoxylate-atropine (LOMOTIL) 2.5-0.025  MG per tablet Take 1 tablet by mouth 3 (three) times daily as needed for diarrhea or loose stools.  90 tablet  5  . MILK THISTLE PO Take 240 mg by mouth daily.      . simethicone (MYLICON) 125 MG chewable tablet Chew 125 mg by mouth every 6 (six) hours as needed. Gas relief.      . pantoprazole (PROTONIX) 40 MG tablet Take 40 mg by mouth 2 (two) times daily.        No current facility-administered medications for this visit.    Allergies as of 07/24/2012 - Review Complete 07/24/2012  Allergen Reaction Noted  . Lorazepam Other (See Comments) 08/01/2010  . Percocet (oxycodone-acetaminophen) Itching and Nausea Only 08/01/2010    Past Medical History  Diagnosis Date  . Gout   . GERD (gastroesophageal reflux disease)   . Cirrhosis     ?ETOH related, afp 02/05/12= 4.9, ct on 10/16/11,no  Hep A/B vaccines  . Colon cancer Dec 2011    Cecum  . ARF (acute renal failure)     hospitalization Jan 2012  . H/O ETOH abuse   . 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 S/P endoscopy Dec 2011    Schatzki's ring, Grade 1 esophageal varices, antral/body erosions  . Anemia of chronic disease 08/10/2010  . Renal insufficiency 10/24/2010  . Schatzki's ring   . Varices, esophageal     Past Surgical History  Procedure Laterality Date  . Colon surgery  02/24/2010    colon cancer (cecum)  . Exploratory laparotomy  03/03/2010    anastomotic leak, developed EC fistula  .   Exploratory laparotomy w/ bowel resection  04/18/2010    ileostomy placed, (hx of EC fistula, anastomotic leak). About two feet of ileum removed.  . Colostomy    . Percutaneous drainage of intraabdominal abscess  07/2010 and 09/2010  . Partial colectomy  10/30/2010    Procedure: PARTIAL COLECTOMY;  Surgeon: Mark A Jenkins;  Location: [facility];  Service: General;  Laterality: N/A;  . Ileostomy closure  10/30/2010    Procedure: ILEOSTOMY TAKEDOWN;  Surgeon: Mark A Jenkins;  Location: [facility];  Service:  General;  Laterality: N/A;  . Colonoscopy  Dec 2011    cecal mass, tubulovillous adenoma at splenic flexure  . Esophagogastroduodenoscopy  Dec 2011    Schatzki's ring, Grade 1 esophageal varices, antral/body erosions  . Colonoscopy N/A 04/02/2012    RMR:Normal residual rectal and colonic mucosa. Next colonoscopy in 03/2015.    Family History  Problem Relation Age of Onset  . Cirrhosis Father     deceased, secondary to ETOH    History   Social History  . Marital Status: Married    Spouse Name: N/A    Number of Children: N/A  . Years of Education: N/A   Occupational History  . Not on file.   Social History Main Topics  . Smoking status: Never Smoker   . Smokeless tobacco: Current User    Types: Chew  . Alcohol Use: Yes     Comment: continues to drink frequent but patient will not quantify  . Drug Use: No  . Sexually Active: Not on file   Other Topics Concern  . Not on file   Social History Narrative  . No narrative on file      ROS:  General: Negative for anorexia, weight loss, fever, chills, fatigue, weakness. Eyes: Negative for vision changes.  ENT: Negative for hoarseness, difficulty swallowing , nasal congestion. CV: Negative for chest pain, angina, palpitations, dyspnea on exertion, peripheral edema.  Respiratory: Negative for dyspnea at rest, dyspnea on exertion, cough, sputum, wheezing.  GI: See history of present illness. GU:  Negative for dysuria, hematuria, urinary incontinence, urinary frequency, nocturnal urination.  MS: Negative for joint pain, low back pain.  Derm: Negative for rash or itching. See hpi. Neuro: Negative for weakness, abnormal sensation, seizure, frequent headaches, memory loss, confusion.  Psych: Negative for anxiety, depression, suicidal ideation, hallucinations.  Endo: Negative for unusual weight change.  Heme: Negative for bruising or bleeding. Allergy: Negative for rash or hives.    Physical Examination:  BP 118/67  Pulse  72  Temp(Src) 98.2 F (36.8 C) (Oral)  Ht 5' 8" (1.727 m)  Wt 160 lb (72.576 kg)  BMI 24.33 kg/m2   General: Well-nourished, well-developed in no acute distress. Accompanied by spouse. Head: Normocephalic, atraumatic.   Eyes: Conjunctiva pink, no icterus. Mouth: Oropharyngeal mucosa moist and pink , no lesions erythema or exudate. Neck: Supple without thyromegaly, masses, or lymphadenopathy.  Lungs: Clear to auscultation bilaterally.  Heart: Regular rate and rhythm, no murmurs rubs or gallops.  Abdomen: Bowel sounds are normal, nontender, nondistended, no hepatosplenomegaly or masses, no abdominal bruits or    hernia , no rebound or guarding.  He has scab over lower midline incision and over center of prior ileostomy. No active drainage or palpable mass. Rectal: not performed Extremities: No lower extremity edema. No clubbing or deformities.  Neuro: Alert and oriented x 4 , grossly normal neurologically.  Skin: Warm and dry, no rash or jaundice.   Psych: Alert and cooperative, normal mood and   affect.  Labs: Lab Results  Component Value Date   WBC 3.8* 07/07/2012   HGB 12.4* 07/07/2012   HCT 36.4* 07/07/2012   MCV 97.6 07/07/2012   PLT 99* 07/07/2012   Lab Results  Component Value Date   CEA 3.1 07/07/2012   Lab Results  Component Value Date   VITAMINB12 468 04/14/2012     Imaging Studies: No results found.    

## 2012-07-24 NOTE — Progress Notes (Signed)
Cc PCP 

## 2012-07-25 NOTE — Progress Notes (Signed)
Please let patient know per Dr. Jena Gauss he should go see Dr. Lovell Sheehan regarding drainage from incisions. EGD and lab work as planned.

## 2012-07-28 ENCOUNTER — Encounter (HOSPITAL_COMMUNITY): Payer: Self-pay | Admitting: Pharmacy Technician

## 2012-08-05 LAB — CBC WITH DIFFERENTIAL/PLATELET
Basophils Absolute: 0 10*3/uL (ref 0.0–0.1)
Eosinophils Relative: 1 % (ref 0–5)
Lymphocytes Relative: 26 % (ref 12–46)
Lymphs Abs: 1.1 10*3/uL (ref 0.7–4.0)
MCV: 95.1 fL (ref 78.0–100.0)
Neutrophils Relative %: 64 % (ref 43–77)
Platelets: 127 10*3/uL — ABNORMAL LOW (ref 150–400)
RBC: 3.68 MIL/uL — ABNORMAL LOW (ref 4.22–5.81)
RDW: 14.4 % (ref 11.5–15.5)
WBC: 4.2 10*3/uL (ref 4.0–10.5)

## 2012-08-05 LAB — COMPREHENSIVE METABOLIC PANEL
AST: 43 U/L — ABNORMAL HIGH (ref 0–37)
Alkaline Phosphatase: 72 U/L (ref 39–117)
BUN: 17 mg/dL (ref 6–23)
Glucose, Bld: 85 mg/dL (ref 70–99)
Sodium: 140 mEq/L (ref 135–145)
Total Bilirubin: 1 mg/dL (ref 0.3–1.2)

## 2012-08-05 LAB — FERRITIN: Ferritin: 675 ng/mL — ABNORMAL HIGH (ref 22–322)

## 2012-08-05 LAB — PROTIME-INR: Prothrombin Time: 12.7 seconds (ref 11.6–15.2)

## 2012-08-06 ENCOUNTER — Ambulatory Visit (HOSPITAL_COMMUNITY)
Admission: RE | Admit: 2012-08-06 | Discharge: 2012-08-06 | Disposition: A | Discharge status: Home / Self Care | Payer: Medicaid Other | Source: Ambulatory Visit | Attending: Internal Medicine | Admitting: Internal Medicine

## 2012-08-06 ENCOUNTER — Encounter (HOSPITAL_COMMUNITY): Payer: Self-pay | Admitting: *Deleted

## 2012-08-06 ENCOUNTER — Encounter (HOSPITAL_COMMUNITY)
Admission: RE | Disposition: A | Discharge status: Home / Self Care | Payer: Self-pay | Source: Ambulatory Visit | Attending: Internal Medicine

## 2012-08-06 DIAGNOSIS — K222 Esophageal obstruction: Secondary | ICD-10-CM | POA: Insufficient documentation

## 2012-08-06 DIAGNOSIS — F1011 Alcohol abuse, in remission: Secondary | ICD-10-CM

## 2012-08-06 DIAGNOSIS — K219 Gastro-esophageal reflux disease without esophagitis: Secondary | ICD-10-CM

## 2012-08-06 DIAGNOSIS — K2270 Barrett's esophagus without dysplasia: Secondary | ICD-10-CM

## 2012-08-06 DIAGNOSIS — K449 Diaphragmatic hernia without obstruction or gangrene: Secondary | ICD-10-CM | POA: Insufficient documentation

## 2012-08-06 DIAGNOSIS — K529 Noninfective gastroenteritis and colitis, unspecified: Secondary | ICD-10-CM

## 2012-08-06 DIAGNOSIS — I8500 Esophageal varices without bleeding: Secondary | ICD-10-CM

## 2012-08-06 DIAGNOSIS — K2100 Gastro-esophageal reflux disease with esophagitis, without bleeding: Secondary | ICD-10-CM | POA: Insufficient documentation

## 2012-08-06 DIAGNOSIS — K319 Disease of stomach and duodenum, unspecified: Secondary | ICD-10-CM

## 2012-08-06 HISTORY — PX: ESOPHAGOGASTRODUODENOSCOPY: SHX5428

## 2012-08-06 HISTORY — PX: ESOPHAGEAL BANDING: SHX5518

## 2012-08-06 SURGERY — EGD (ESOPHAGOGASTRODUODENOSCOPY)
Anesthesia: Moderate Sedation

## 2012-08-06 MED ORDER — ONDANSETRON HCL 4 MG/2ML IJ SOLN
INTRAMUSCULAR | Status: DC | PRN
  Administered 2012-08-06: 4 mg via INTRAVENOUS

## 2012-08-06 MED ORDER — SODIUM CHLORIDE 0.9 % IV SOLN
INTRAVENOUS | Status: DC
  Administered 2012-08-06: 1000 mL via INTRAVENOUS

## 2012-08-06 MED ORDER — MEPERIDINE HCL 100 MG/ML IJ SOLN
INTRAMUSCULAR | Status: DC | PRN
  Administered 2012-08-06: 50 mg via INTRAVENOUS
  Administered 2012-08-06: 25 mg via INTRAVENOUS

## 2012-08-06 MED ORDER — ONDANSETRON HCL 4 MG/2ML IJ SOLN
INTRAMUSCULAR | Status: AC
  Filled 2012-08-06: qty 2

## 2012-08-06 MED ORDER — MIDAZOLAM HCL 5 MG/5ML IJ SOLN
INTRAMUSCULAR | Status: DC | PRN
  Administered 2012-08-06 (×2): 1 mg via INTRAVENOUS
  Administered 2012-08-06: 2 mg via INTRAVENOUS

## 2012-08-06 MED ORDER — STERILE WATER FOR IRRIGATION IR SOLN
Status: DC | PRN
  Administered 2012-08-06: 07:00:00

## 2012-08-06 MED ORDER — MEPERIDINE HCL 100 MG/ML IJ SOLN
INTRAMUSCULAR | Status: AC
  Filled 2012-08-06: qty 2

## 2012-08-06 MED ORDER — MIDAZOLAM HCL 5 MG/5ML IJ SOLN
INTRAMUSCULAR | Status: AC
  Filled 2012-08-06: qty 10

## 2012-08-06 MED ORDER — BUTAMBEN-TETRACAINE-BENZOCAINE 2-2-14 % EX AERO
INHALATION_SPRAY | CUTANEOUS | Status: DC | PRN
  Administered 2012-08-06: 2 via TOPICAL

## 2012-08-06 NOTE — Op Note (Signed)
[facility] [address] [place], [address]   ENDOSCOPY PROCEDURE REPORT  PATIENT: Peter [name], Peter [name]  MR#: [id number] BIRTHDATE: [birth year] , 61  yrs. old GENDER: Male ENDOSCOPIST: R.  Roetta Sessions, MD FACP FACG REFERRED BY:  Butch Penny, M.D. PROCEDURE DATE:  08/06/2012 PROCEDURE:     EGD with esophageal and gastric biopsy  INDICATIONS:    History esophageal varices and GERD  INFORMED CONSENT:   The risks, benefits, limitations, alternatives and imponderables have been discussed.  The potential for biopsy, esophogeal dilation, etc. have also been reviewed.  Questions have been answered.  All parties agreeable.  Please see the history and physical in the medical record for more information.  MEDICATIONS:    Versed 4 mg IV and Demerol 75 mg IV in divided doses. Zofran 4 mg IV. Cetacaine spray.  DESCRIPTION OF PROCEDURE:   The [id number] ([id number])  endoscope was introduced through the mouth and advanced to the second portion of the duodenum without difficulty or limitations.  The mucosal surfaces were surveyed very carefully during advancement of the scope and upon withdrawal.  Retroflexion view of the proximal stomach and esophagogastric junction was performed.      FINDINGS:   Noncritical Schatzki's ring. 3 short columns of grade 1 esophageal varices. 1.5 cm "tongue" of salmon colored epitheliumn coming up above the Schatzki's ring. Also, a couple of distal esophageal erosions straddling the GE junction.  Stomach empty. Small hiatal hernia. Diffuse fishscale or snakeskin appearance of gastric mucosa with patchy superimposed erythema. No ulcer or infiltrating process. No varices. Patent pylorus. Normal first and second portion of the duodenum.  THERAPEUTIC / DIAGNOSTIC MANEUVERS PERFORMED:  Biopsies of the abnormal esophagus and stomach taken for histologic study   COMPLICATIONS:  None  IMPRESSION:    Grade 1 esophageal varices. Abnormal  distal esophageal mucosa-3 short segment Barrett's. Status post biopsy. Mild erosive reflux esophagitis. Noncritical Schatzki's ring-not manipulated.  Hiatal hernia. Portal gastropathy. Gastric erythema-status post biopsy  RECOMMENDATIONS:       Stop consuming alcohol-it will shorten your lifespan.  Begin Inderal 20 mg orally twice daily. Continue Protonix. Followup on pathology. Further recommendations to follow.    _______________________________ R. Roetta Sessions, MD FACP [facility] eSigned:  R. Roetta Sessions, MD FACP [facility] 08/06/2012 8:10 AM     CC:  PATIENT NAME:  Peter [name], Peter [name] MR#: [id number]

## 2012-08-06 NOTE — H&P (View-Only) (Signed)
Primary Care Physician:  [name], MD  Primary Gastroenterologist:  Roetta Sessions, MD   Chief Complaint  Patient presents with  . Follow-up  . Medication Refill    HPI:  Peter [name] is a 62 y.o. male here to schedule EGD for surveillance of esophageal varices. He had a colonoscopy since his last office visit (unremarkable). He has h/o colon cancer status post right hemicolectomy, postoperative course complicated by anastomotic leak and intra-abdominal abscess requiring recurrent reoperation and subsequent percutaneous drainage. Ileostomy has been taken down. He has a history of protracted fistula drainage which had tapered off last year. See past surgical history for details.   No melena, brbpr. Takes Lomotil one pill about every day. If doesn't take it then, increased diarrhea. Appetite fair. No heartburn, dysphagia. Heartburn controlled with protonix but currently out. Weight stable. C/O some bleeding (puddle of blood) and then pus from lower midline abdominal incision about 6 weeks ago. This was not location of prior fistula per patient. According to the wife, they called Dr. Leticia Penna on call and he advised to watch and call for appointment. Patient did not make f/u appointment. He reports drainage about once per month. Also notes scabbing over center of prior ileostomy site, "just doesn't want to heal."  Discussed his ongoing alcohol consumption. He really will not quantify how frequently or how often he drinks. He does admit that he drinks more days then he does not. "Depending on what's going on". He does not feel he has a dependency on alcohol. He is not interested in quitting. He acknowledges that his father died from alcoholic cirrhosis. He still has not had his vaccinations for hepatitis A and B., he has been noncompliant. Discussed this with patient as well today.   Current Outpatient Prescriptions  Medication Sig Dispense Refill  . diphenoxylate-atropine (LOMOTIL) 2.5-0.025  MG per tablet Take 1 tablet by mouth 3 (three) times daily as needed for diarrhea or loose stools.  90 tablet  5  . MILK THISTLE PO Take 240 mg by mouth daily.      . simethicone (MYLICON) 125 MG chewable tablet Chew 125 mg by mouth every 6 (six) hours as needed. Gas relief.      . pantoprazole (PROTONIX) 40 MG tablet Take 40 mg by mouth 2 (two) times daily.        No current facility-administered medications for this visit.    Allergies as of 07/24/2012 - Review Complete 07/24/2012  Allergen Reaction Noted  . Lorazepam Other (See Comments) 08/01/2010  . Percocet (oxycodone-acetaminophen) Itching and Nausea Only 08/01/2010    Past Medical History  Diagnosis Date  . Gout   . GERD (gastroesophageal reflux disease)   . Cirrhosis     ?ETOH related, afp 02/05/12= 4.9, ct on 10/16/11,no  Hep A/B vaccines  . Colon cancer Dec 2011    Cecum  . ARF (acute renal failure)     hospitalization Jan 2012  . H/O ETOH abuse   . 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 S/P endoscopy Dec 2011    Schatzki's ring, Grade 1 esophageal varices, antral/body erosions  . Anemia of chronic disease 08/10/2010  . Renal insufficiency 10/24/2010  . Schatzki's ring   . Varices, esophageal     Past Surgical History  Procedure Laterality Date  . Colon surgery  02/24/2010    colon cancer (cecum)  . Exploratory laparotomy  03/03/2010    anastomotic leak, developed EC fistula  .  Exploratory laparotomy w/ bowel resection  04/18/2010    ileostomy placed, (hx of EC fistula, anastomotic leak). About two feet of ileum removed.  . Colostomy    . Percutaneous drainage of intraabdominal abscess  07/2010 and 09/2010  . Partial colectomy  10/30/2010    Procedure: PARTIAL COLECTOMY;  Surgeon: Dalia Heading;  Location: [facility];  Service: General;  Laterality: N/A;  . Ileostomy closure  10/30/2010    Procedure: ILEOSTOMY TAKEDOWN;  Surgeon: Dalia Heading;  Location: [facility];  Service:  General;  Laterality: N/A;  . Colonoscopy  Dec 2011    cecal mass, tubulovillous adenoma at splenic flexure  . Esophagogastroduodenoscopy  Dec 2011    Schatzki's ring, Grade 1 esophageal varices, antral/body erosions  . Colonoscopy N/A 04/02/2012    [id number] residual rectal and colonic mucosa. Next colonoscopy in 03/2015.    Family History  Problem Relation Age of Onset  . Cirrhosis Father     deceased, secondary to ETOH    History   Social History  . Marital Status: Married    Spouse Name: N/A    Number of Children: N/A  . Years of Education: N/A   Occupational History  . Not on file.   Social History Main Topics  . Smoking status: Never Smoker   . Smokeless tobacco: Current User    Types: Chew  . Alcohol Use: Yes     Comment: continues to drink frequent but patient will not quantify  . Drug Use: No  . Sexually Active: Not on file   Other Topics Concern  . Not on file   Social History Narrative  . No narrative on file      ROS:  General: Negative for anorexia, weight loss, fever, chills, fatigue, weakness. Eyes: Negative for vision changes.  ENT: Negative for hoarseness, difficulty swallowing , nasal congestion. CV: Negative for chest pain, angina, palpitations, dyspnea on exertion, peripheral edema.  Respiratory: Negative for dyspnea at rest, dyspnea on exertion, cough, sputum, wheezing.  GI: See history of present illness. GU:  Negative for dysuria, hematuria, urinary incontinence, urinary frequency, nocturnal urination.  MS: Negative for joint pain, low back pain.  Derm: Negative for rash or itching. See hpi. Neuro: Negative for weakness, abnormal sensation, seizure, frequent headaches, memory loss, confusion.  Psych: Negative for anxiety, depression, suicidal ideation, hallucinations.  Endo: Negative for unusual weight change.  Heme: Negative for bruising or bleeding. Allergy: Negative for rash or hives.    Physical Examination:  BP 118/67  Pulse  72  Temp(Src) 98.2 F (36.8 C) (Oral)  Ht 5\' 8"  (1.727 m)  Wt 160 lb (72.576 kg)  BMI 24.33 kg/m2   General: Well-nourished, well-developed in no acute distress. Accompanied by spouse. Head: Normocephalic, atraumatic.   Eyes: Conjunctiva pink, no icterus. Mouth: Oropharyngeal mucosa moist and pink , no lesions erythema or exudate. Neck: Supple without thyromegaly, masses, or lymphadenopathy.  Lungs: Clear to auscultation bilaterally.  Heart: Regular rate and rhythm, no murmurs rubs or gallops.  Abdomen: Bowel sounds are normal, nontender, nondistended, no hepatosplenomegaly or masses, no abdominal bruits or    hernia , no rebound or guarding.  He has scab over lower midline incision and over center of prior ileostomy. No active drainage or palpable mass. Rectal: not performed Extremities: No lower extremity edema. No clubbing or deformities.  Neuro: Alert and oriented x 4 , grossly normal neurologically.  Skin: Warm and dry, no rash or jaundice.   Psych: Alert and cooperative, normal mood and  affect.  Labs: Lab Results  Component Value Date   WBC 3.8* 07/07/2012   HGB 12.4* 07/07/2012   HCT 36.4* 07/07/2012   MCV 97.6 07/07/2012   PLT 99* 07/07/2012   Lab Results  Component Value Date   CEA 3.1 07/07/2012   Lab Results  Component Value Date   VITAMINB12 468 04/14/2012     Imaging Studies: No results found.

## 2012-08-06 NOTE — Interval H&P Note (Signed)
History and Physical Interval Note:  08/06/2012 7:34 AM  Peter [name]  has presented today for surgery, with the diagnosis of ESOPHAGEAL VARICES, CHRONIC DIARRHEA  AND GERD  The various methods of treatment have been discussed with the patient and family. After consideration of risks, benefits and other options for treatment, the patient has consented to  Procedure(s) with comments: ESOPHAGOGASTRODUODENOSCOPY (EGD) (N/A) - 11:15-moved to [address] notified pt ESOPHAGEAL BANDING (N/A) as a surgical intervention .  The patient's history has been reviewed, patient examined, no change in status, stable for surgery.  I have reviewed the patient's chart and labs.  Questions were answered to the patient's satisfaction.     Peter [name]  EGD with esophageal band ligation per plan.The risks, benefits, limitations, alternatives and imponderables have been reviewed with the patient. Potential for esophageal dilation, biopsy, etc. have also been reviewed.  Questions have been answered. All parties agreeable.

## 2012-08-08 ENCOUNTER — Encounter (HOSPITAL_COMMUNITY): Payer: Self-pay | Admitting: Internal Medicine

## 2012-08-11 NOTE — Progress Notes (Signed)
Quick Note:  Labs are stable. Ferritin continues to improve but still elevated (?secondary to etoh). Iron level normal 2 years ago. To discuss with Dr. Jena Gauss which yearly imaging to use for hepatoma surveillance. ?CT vs U/S? We will let pt know. ______

## 2012-08-12 NOTE — Progress Notes (Signed)
Quick Note:  Tried to call pt- LMOM ______ 

## 2012-08-13 ENCOUNTER — Encounter: Payer: Self-pay | Admitting: Internal Medicine

## 2012-08-14 NOTE — Progress Notes (Signed)
Quick Note:  Tried to call pt- LMOM ______ 

## 2012-09-04 NOTE — Progress Notes (Signed)
Quick Note:  Please let patient know that Dr. Jena Gauss recommends abdominal ultrasound every 6 months for hepatoma surveillance. He is due for abdominal ultrasound at this time. Please make arrangements. ______

## 2012-09-12 ENCOUNTER — Other Ambulatory Visit: Payer: Self-pay | Admitting: Internal Medicine

## 2012-09-12 DIAGNOSIS — K7030 Alcoholic cirrhosis of liver without ascites: Secondary | ICD-10-CM

## 2012-09-12 NOTE — Progress Notes (Signed)
Patient is scheduled for Wednesday July 30 at 8:00 am and I have spoken to Peter [name] and he is aware

## 2012-09-17 ENCOUNTER — Ambulatory Visit (HOSPITAL_COMMUNITY)
Admission: RE | Admit: 2012-09-17 | Discharge: 2012-09-17 | Disposition: A | Discharge status: Home / Self Care | Payer: Medicaid Other | Source: Ambulatory Visit | Attending: Internal Medicine | Admitting: Internal Medicine

## 2012-09-17 DIAGNOSIS — R161 Splenomegaly, not elsewhere classified: Secondary | ICD-10-CM | POA: Insufficient documentation

## 2012-09-17 DIAGNOSIS — F1020 Alcohol dependence, uncomplicated: Secondary | ICD-10-CM | POA: Insufficient documentation

## 2012-09-17 DIAGNOSIS — K7030 Alcoholic cirrhosis of liver without ascites: Secondary | ICD-10-CM | POA: Insufficient documentation

## 2012-09-22 ENCOUNTER — Ambulatory Visit: Payer: Medicaid Other | Admitting: Gastroenterology

## 2012-09-29 ENCOUNTER — Ambulatory Visit: Payer: Medicaid Other | Admitting: Gastroenterology

## 2012-10-02 ENCOUNTER — Encounter: Payer: Self-pay | Admitting: Internal Medicine

## 2012-10-06 ENCOUNTER — Ambulatory Visit (INDEPENDENT_AMBULATORY_CARE_PROVIDER_SITE_OTHER): Payer: Medicaid Other | Admitting: Gastroenterology

## 2012-10-06 ENCOUNTER — Encounter: Payer: Self-pay | Admitting: Gastroenterology

## 2012-10-06 ENCOUNTER — Encounter (HOSPITAL_COMMUNITY): Payer: Medicaid Other | Attending: Hematology and Oncology

## 2012-10-06 VITALS — BP 135/87 | HR 84 | Temp 98.4°F | Ht 68.0 in | Wt 163.4 lb

## 2012-10-06 DIAGNOSIS — K7030 Alcoholic cirrhosis of liver without ascites: Secondary | ICD-10-CM

## 2012-10-06 DIAGNOSIS — C189 Malignant neoplasm of colon, unspecified: Secondary | ICD-10-CM

## 2012-10-06 DIAGNOSIS — Z85038 Personal history of other malignant neoplasm of large intestine: Secondary | ICD-10-CM | POA: Insufficient documentation

## 2012-10-06 DIAGNOSIS — Z09 Encounter for follow-up examination after completed treatment for conditions other than malignant neoplasm: Secondary | ICD-10-CM | POA: Insufficient documentation

## 2012-10-06 DIAGNOSIS — K7460 Unspecified cirrhosis of liver: Secondary | ICD-10-CM

## 2012-10-06 LAB — CBC WITH DIFFERENTIAL/PLATELET
Basophils Absolute: 0 10*3/uL (ref 0.0–0.1)
Basophils Relative: 0 % (ref 0–1)
Eosinophils Absolute: 0.1 10*3/uL (ref 0.0–0.7)
MCH: 33.8 pg (ref 26.0–34.0)
MCHC: 34.1 g/dL (ref 30.0–36.0)
Neutro Abs: 3 10*3/uL (ref 1.7–7.7)
Neutrophils Relative %: 66 % (ref 43–77)
RDW: 13.9 % (ref 11.5–15.5)

## 2012-10-06 LAB — COMPREHENSIVE METABOLIC PANEL
Albumin: 3.6 g/dL (ref 3.5–5.2)
Alkaline Phosphatase: 82 U/L (ref 39–117)
BUN: 15 mg/dL (ref 6–23)
Calcium: 9.2 mg/dL (ref 8.4–10.5)
GFR calc non Af Amer: 55 mL/min — ABNORMAL LOW (ref >90)
Potassium: 3.8 mEq/L (ref 3.5–5.1)
Sodium: 139 mEq/L (ref 135–145)
Total Bilirubin: 1.5 mg/dL — ABNORMAL HIGH (ref 0.3–1.2)
Total Protein: 6.9 g/dL (ref 6.0–8.3)

## 2012-10-06 NOTE — Progress Notes (Signed)
Referring Provider: [name], MD Primary Care Physician:  [name], MD Primary GI: Dr. Jena Gauss   Chief Complaint  Patient presents with  . Follow-up    HPI:   [name] presents today in follow-up from EGD for variceal surveillance. He has a history of ETOH cirrhosis, with last [place] of abdomen Aug 2014 without HCC. Due in 6 months. EGD showed Grade 1 varices, negative barrett's, mild reflux esophagitis. Non-critical Schatzki's ring, portal gastropathy. Was instructed to begin Inderal 20 mg BID, but it does not appear he has started this.   Denies abdominal pain. One area of lower incision difficulty healing.   Hep A and B vaccinations: hasn't completed.  Next surveillance colonoscopy Feb 2017. History of colon cancer.   Past Medical History  Diagnosis Date  . Gout   . GERD (gastroesophageal reflux disease)   . Cirrhosis     ?ETOH related, afp 02/05/12= 4.9, ct on 10/16/11,no  Hep A/B vaccines  . Colon cancer Dec 2011    Cecum  . ARF (acute renal failure)     hospitalization Jan 2012  . H/O ETOH abuse   . 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 S/P endoscopy Dec 2011    Schatzki's ring, Grade 1 esophageal varices, antral/body erosions  . Anemia of chronic disease 08/10/2010  . Renal insufficiency 10/24/2010  . Schatzki's ring   . Varices, esophageal     Past Surgical History  Procedure Laterality Date  . Colon surgery  02/24/2010    colon cancer (cecum)  . Exploratory laparotomy  03/03/2010    anastomotic leak, developed EC fistula  . Exploratory laparotomy w/ bowel resection  04/18/2010    ileostomy placed, (hx of EC fistula, anastomotic leak). About two feet of ileum removed.  . Colostomy    . Percutaneous drainage of intraabdominal abscess  07/2010 and 09/2010  . Partial colectomy  10/30/2010    Procedure: PARTIAL COLECTOMY;  Surgeon: Dalia Heading;  Location: [facility];  Service: General;  Laterality: N/A;  .  Ileostomy closure  10/30/2010    Procedure: ILEOSTOMY TAKEDOWN;  Surgeon: Dalia Heading;  Location: [facility];  Service: General;  Laterality: N/A;  . Colonoscopy  Dec 2011    cecal mass, tubulovillous adenoma at splenic flexure  . Esophagogastroduodenoscopy  Dec 2011    Schatzki's ring, Grade 1 esophageal varices, antral/body erosions  . Colonoscopy N/A 04/02/2012    [id number] residual rectal and colonic mucosa. Next colonoscopy in 03/2015.  Marland Kitchen Esophagogastroduodenoscopy N/A 08/06/2012    RMR: Grade 1 esophageal varices. Abnormal distal esophageal mucosa-3 short segment Barrett's-like appearance but negative biopsy for barrett's. mild erosive reflux esophagitis, non-critical Schatzki's ring, hiatal hernia, portal gastropathy.   . Esophageal banding N/A 08/06/2012    no banding     Current Outpatient Prescriptions  Medication Sig Dispense Refill  . diphenoxylate-atropine (LOMOTIL) 2.5-0.025 MG per tablet Take 1 tablet by mouth 3 (three) times daily as needed for diarrhea or loose stools.      Marland Kitchen MILK THISTLE PO Take 240 mg by mouth daily.      . pantoprazole (PROTONIX) 40 MG tablet Take 1 tablet (40 mg total) by mouth daily.  30 tablet  11   No current facility-administered medications for this visit.    Allergies as of 10/06/2012 - Review Complete 10/06/2012  Allergen Reaction Noted  . Lorazepam Other (See Comments) 08/01/2010  . Percocet [oxycodone-acetaminophen] Itching and Nausea Only 08/01/2010  Family History  Problem Relation Age of Onset  . Cirrhosis Father     deceased, secondary to ETOH    History   Social History  . Marital Status: Married    Spouse Name: N/A    Number of Children: N/A  . Years of Education: N/A   Social History Main Topics  . Smoking status: Never Smoker   . Smokeless tobacco: Current User    Types: Chew  . Alcohol Use: Yes     Comment: continues to drink frequent but patient will not quantify  . Drug Use: No  . Sexual Activity: None   Other  Topics Concern  . None   Social History Narrative  . None    Review of Systems: Negative unless mentioned in HPI.   Physical Exam: BP 135/87  Pulse 84  Temp(Src) 98.4 F (36.9 C) (Oral)  Ht 5\' 8"  (1.727 m)  Wt 163 lb 6.4 oz (74.118 kg)  BMI 24.85 kg/m2 General:   Alert and oriented. No distress noted. Pleasant and cooperative.  Head:  Normocephalic and atraumatic. Heart:  S1, S2 present without murmurs, rubs, or gallops. Regular rate and rhythm. Abdomen:  +BS, soft, non-tender and non-distended. No rebound or guarding. No HSM or masses noted. Lower incision with small area of persistent granulation tissue.  Msk:  Symmetrical without gross deformities. Normal posture. Extremities:  Without edema. Neurologic:  Alert and  oriented x4;  grossly normal neurologically. Skin:  Intact without significant lesions or rashes. Psych:  Alert and cooperative. Normal mood and affect.  Lab Results  Component Value Date   ALT 23 10/06/2012   AST 37 10/06/2012   ALKPHOS 82 10/06/2012   BILITOT 1.5* 10/06/2012   Lab Results  Component Value Date   WBC 4.6 10/06/2012   HGB 13.2 10/06/2012   HCT 38.7* 10/06/2012   MCV 99.0 10/06/2012   PLT 117* 10/06/2012   Lab Results  Component Value Date   CREATININE 1.37* 10/06/2012   BUN 15 10/06/2012   NA 139 10/06/2012   K 3.8 10/06/2012   CL 101 10/06/2012   CO2 25 10/06/2012

## 2012-10-06 NOTE — Patient Instructions (Addendum)
Please get the Hepatitis A and B vaccinations. I have provided a prescription for this. Check with your primary care doctor or the Health Department for this.   See Dr. Lovell Sheehan about your incision :)   We will see you back in 6 months!

## 2012-10-06 NOTE — Progress Notes (Signed)
Labs drawn today for cbc/diff,cmp,cea 

## 2012-10-07 ENCOUNTER — Telehealth: Payer: Self-pay | Admitting: Gastroenterology

## 2012-10-07 MED ORDER — PROPRANOLOL HCL 20 MG PO TABS: 20.0000 mg | ORAL_TABLET | Freq: Two times a day (BID) | ORAL | Status: DC

## 2012-10-07 NOTE — Telephone Encounter (Signed)
Patient needs to be on Inderal 20 mg BID as instructed by Dr. Jena Gauss at time of EGD. I sent this to the pharmacy. Please have him start this due to Grade 1 esophageal varices.

## 2012-10-07 NOTE — Assessment & Plan Note (Signed)
EGD up to date with Grade 1 varices, negative Barrett's, +esophagitis. Needs to start Inderal 20 mg BID, which he has not done thus far.   Continues to drink alcohol but has "cut back". Discussed possible worsening of health to include early death if he does not stop. At this point, patient is not willing to make this change. Still needs Hep A and B vaccinations: prescription provided.   Return in 6 months, start Inderal 20 mg BID as instructed previously.

## 2012-10-07 NOTE — Assessment & Plan Note (Signed)
Due for surveillance Feb 2017. Bottom of surgical incision likely amenable to silver nitrate; defer to surgeon. I have asked patient to follow-up with Dr. Lovell Sheehan. No drainage or evidence of infection.

## 2012-10-07 NOTE — Progress Notes (Signed)
CC'd to PCP 

## 2012-10-08 ENCOUNTER — Encounter (HOSPITAL_COMMUNITY): Payer: Self-pay

## 2012-10-08 ENCOUNTER — Encounter (HOSPITAL_BASED_OUTPATIENT_CLINIC_OR_DEPARTMENT_OTHER): Payer: Medicaid Other

## 2012-10-08 VITALS — BP 121/75 | HR 92 | Temp 98.2°F | Resp 16 | Wt 162.8 lb

## 2012-10-08 DIAGNOSIS — C189 Malignant neoplasm of colon, unspecified: Secondary | ICD-10-CM

## 2012-10-08 NOTE — Progress Notes (Signed)
[facility] [phone]   [phone]  OFFICE PROGRESS NOTE  Peter Reichert, MD [address] [place] [address]  DIAGNOSIS:  Stage 1 colon cancer  INTERVAL HISTORY:   Peter [name] 62 y.o. male returns to the clinic today for scheduled follow up of stage I colon cancer and there was diagnosed in January 2012 . Following resection of the colon. Pathology showed T1 N0 with  18/18 lymph nodes negative for evidence of malignancy.  Prior to this, patient was noted to have an cecal mass that was biopsied on 02/07/2010 which showed tubulovillous adenoma with high-grade dysplasia and focal area suspicious for invasive carcinoma. Last surveillance colonoscopy on 04/02/2012 showed normal residual rectal or colonic mucosa and repeat surveillance colonoscopy in 3 years was recommended by Dr. Roetta Sessions. He had a CT scan in  August of 2013 which did not show any evidence of malignancy. He is accompanied by his wife today.  He reports intermittent  wound breakdown 'leakage' from the lower part of the  abdominal incision.  He is eating well and denies shortness of breath. She has no nausea or vomiting or significant weakness. He denies melena hematochezia or hematemesis. He wants to know if he could get to hepatitis B vaccination here.  MEDICAL HISTORY: Past Medical History  Diagnosis Date  . Gout   . GERD (gastroesophageal reflux disease)   . Cirrhosis     ?ETOH related, afp 02/05/12= 4.9, ct on 10/16/11,no  Hep A/B vaccines  . Colon cancer Dec 2011    Cecum  . ARF (acute renal failure)     hospitalization Jan 2012  . H/O ETOH abuse   . 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 S/P endoscopy Dec 2011    Schatzki's ring, Grade 1 esophageal varices, antral/body erosions  . Anemia of chronic disease 08/10/2010  . Renal insufficiency 10/24/2010  . Schatzki's ring   . Varices, esophageal     ALLERGIES:  is  allergic to lorazepam and percocet.  MEDICATIONS:  Current Outpatient Prescriptions  Medication Sig Dispense Refill  . diphenoxylate-atropine (LOMOTIL) 2.5-0.025 MG per tablet Take 1 tablet by mouth 3 (three) times daily as needed for diarrhea or loose stools.      Marland Kitchen MILK THISTLE PO Take 240 mg by mouth daily.      . pantoprazole (PROTONIX) 40 MG tablet Take 1 tablet (40 mg total) by mouth daily.  30 tablet  11  . propranolol (INDERAL) 20 MG tablet Take 1 tablet (20 mg total) by mouth 2 (two) times daily.  60 tablet  5   No current facility-administered medications for this visit.    SURGICAL HISTORY:  Past Surgical History  Procedure Laterality Date  . Colon surgery  02/24/2010    colon cancer (cecum)  . Exploratory laparotomy  03/03/2010    anastomotic leak, developed EC fistula  . Exploratory laparotomy w/ bowel resection  04/18/2010    ileostomy placed, (hx of EC fistula, anastomotic leak). About two feet of ileum removed.  . Colostomy    . Percutaneous drainage of intraabdominal abscess  07/2010 and 09/2010  . Partial colectomy  10/30/2010    Procedure: PARTIAL COLECTOMY;  Surgeon: Dalia Heading;  Location: [facility];  Service: General;  Laterality: N/A;  . Ileostomy closure  10/30/2010    Procedure: ILEOSTOMY TAKEDOWN;  Surgeon: Dalia Heading;  Location: [facility];  Service: General;  Laterality: N/A;  . Colonoscopy  Dec 2011    cecal mass, tubulovillous adenoma at splenic flexure  . Esophagogastroduodenoscopy  Dec 2011    Schatzki's ring, Grade 1 esophageal varices, antral/body erosions  . Colonoscopy N/A 04/02/2012    [id number] residual rectal and colonic mucosa. Next colonoscopy in 03/2015.  Marland Kitchen Esophagogastroduodenoscopy N/A 08/06/2012    RMR: Grade 1 esophageal varices. Abnormal distal esophageal mucosa-3 short segment Barrett's-like appearance but negative biopsy for barrett's. mild erosive reflux esophagitis, non-critical Schatzki's ring, hiatal hernia, portal gastropathy.   .  Esophageal banding N/A 08/06/2012    no banding      REVIEW OF SYSTEMS: 14 point review of system is as in the history above otherwise negative.  PHYSICAL EXAMINATION:  Blood pressure 121/75, pulse 92, temperature 98.2 F (36.8 C), temperature source Oral, resp. rate 16, weight 162 lb 12.8 oz (73.846 kg). GENERAL: No acute distress. SKIN:  No rashes or significant lesions . No ecchymosis or petechial rash. HEAD: Normocephalic, No masses, lesions, tenderness or abnormalities  EYES: Conjunctiva are pink and non-injected and no jaundice ENT: External ears normal ,lips, buccal mucosa, and tongue normal and mucous membranes are moist . No evidence of thrush. LYMPH: No palpable lymphadenopathy, in the neck, supraclavicular areas. LUNGS: Clear to auscultation , no crackles or wheezes HEART: regular rate & rhythm, no murmurs, no gallops, S1 normal and S2 normal and no S3. ABDOMEN: Abdomen soft, non-tender, surgical scar appears to have been freshly healed no significant obvious wound dehiscence. EXTREMITIES: No edema, no skin discoloration or tenderness NEURO: Alert & oriented , no focal motor  deficits.     LABORATORY DATA: Lab Results  Component Value Date   WBC 4.6 10/06/2012   HGB 13.2 10/06/2012   HCT 38.7* 10/06/2012   MCV 99.0 10/06/2012   PLT 117* 10/06/2012      Chemistry      Component Value Date/Time   NA 139 10/06/2012 0929   K 3.8 10/06/2012 0929   CL 101 10/06/2012 0929   CO2 25 10/06/2012 0929   BUN 15 10/06/2012 0929   CREATININE 1.37* 10/06/2012 0929   CREATININE 1.23 08/05/2012 0745      Component Value Date/Time   CALCIUM 9.2 10/06/2012 0929   ALKPHOS 82 10/06/2012 0929   AST 37 10/06/2012 0929   ALT 23 10/06/2012 0929   BILITOT 1.5* 10/06/2012 0929       RADIOGRAPHIC STUDIES: US Abdomen Complete  09/17/2012   *RADIOLOGY REPORT*  Clinical Data:  Alcoholic cirrhosis.  Hepatoma surveillance.  COMPLETE ABDOMINAL ULTRASOUND  Comparison:  CT 10/16/2011  Findings:   Gallbladder:  No gallstones, gallbladder wall thickening, or pericholecystic fluid.  Common bile duct:   Normal caliber, 4 mm.  Liver:  Heterogeneous echotexture with nodular contours compatible with cirrhosis.  No focal abnormality or biliary ductal dilatation.  IVC:  Appears normal.  Pancreas:  No focal abnormality seen.  Spleen:  Upper limits normal in craniocaudal length at 12.6 cm. Enlarged based on volume at 533 ml.  No focal abnormality.  Right Kidney:   Normal in size and parenchymal echogenicity.  No evidence of mass or hydronephrosis.  Left Kidney:  Normal in size and parenchymal echogenicity.  No evidence of mass or hydronephrosis.  Abdominal aorta:  No aneurysm identified.  IMPRESSION: Changes of cirrhosis.  Associated mild splenomegaly.  No focal hepatic abnormality.   Original Report Authenticated By: Charlett Nose, M.D.     ASSESSMENT:  Stage 1 colon cancer S/P colon resection.  Patient  has no evidence of disease recurrence.  He is up-to-date with his routine surveillance measures.   PLAN:  1. Return to clinic in 6 months. 2. Colonoscopy as the recommended by GI in 3 years. 3. Instructed patient to call Dr. Lovell Sheehan and if he has any issues with the surgical scar. 4. For requested hepatitis B vaccination I asked him to discuss this with he primary physician as we do not routinely deliver this of care in our facility.    All questions were satisfactorily answered. Patient knows to call if  any concern arises.  I spent more than 50 % counseling the patient face to face. The total time spent in the appointment was 30 minutes.   Sherral Hammers, MD FACP. Hematology/Oncology.

## 2012-10-08 NOTE — Patient Instructions (Addendum)
[facility] Discharge Instructions  RECOMMENDATIONS MADE BY THE CONSULTANT AND ANY TEST RESULTS WILL BE SENT TO YOUR REFERRING PHYSICIAN.  EXAM FINDINGS BY THE PHYSICIAN TODAY AND SIGNS OR SYMPTOMS TO REPORT TO CLINIC OR PRIMARY PHYSICIAN: Exam and discussion by Dr. Sharia Reeve.  You are doing well.  No need to make any changes.  MEDICATIONS PRESCRIBED:   none  INSTRUCTIONS GIVEN AND DISCUSSED: Report blood in bowel movements, unexplained weight loss or other problems.  SPECIAL INSTRUCTIONS/FOLLOW-UP: Follow-up in 6 months with blood work and MD visit.  Thank you for choosing [name] to provide your oncology and hematology care.  To afford each patient quality time with our providers, please arrive at least 15 minutes before your scheduled appointment time.  With your help, our goal is to use those 15 minutes to complete the necessary work-up to ensure our physicians have the information they need to help with your evaluation and healthcare recommendations.    Effective January 1st, 2014, we ask that you re-schedule your appointment with our physicians should you arrive 10 or more minutes late for your appointment.  We strive to give you quality time with our providers, and arriving late affects you and other patients whose appointments are after yours.    Again, thank you for choosing [facility].  Our hope is that these requests will decrease the amount of time that you wait before being seen by our physicians.       _____________________________________________________________  Should you have questions after your visit to [facility], please contact our office at [phone] between the hours of 8:30 a.m. and 5:00 p.m.  Voicemails left after 4:30 p.m. will not be returned until the following business day.  For prescription refill requests, have your pharmacy contact our office with your prescription refill request.

## 2012-10-08 NOTE — Telephone Encounter (Signed)
Tried to call pt- NA 

## 2012-10-09 NOTE — Telephone Encounter (Signed)
Tried to call pt- LMOM 

## 2012-10-10 ENCOUNTER — Other Ambulatory Visit (HOSPITAL_COMMUNITY): Payer: Medicaid Other

## 2012-10-10 ENCOUNTER — Telehealth: Payer: Self-pay | Admitting: *Deleted

## 2012-10-10 NOTE — Telephone Encounter (Signed)
Pt's wife called, states she was returning a call from nurse. Please advise [phone]. Pt's wife states to [facility] if you can't reach her.

## 2012-10-10 NOTE — Telephone Encounter (Signed)
Called- LMOM with instructions.

## 2012-10-10 NOTE — Telephone Encounter (Signed)
Called pt- LMOM with instructions.  

## 2013-01-19 DIAGNOSIS — K3184 Gastroparesis: Secondary | ICD-10-CM

## 2013-01-19 HISTORY — DX: Gastroparesis: K31.84

## 2013-02-02 ENCOUNTER — Ambulatory Visit (INDEPENDENT_AMBULATORY_CARE_PROVIDER_SITE_OTHER): Payer: Medicaid Other | Admitting: Gastroenterology

## 2013-02-02 ENCOUNTER — Encounter: Payer: Self-pay | Admitting: Gastroenterology

## 2013-02-02 ENCOUNTER — Encounter (INDEPENDENT_AMBULATORY_CARE_PROVIDER_SITE_OTHER): Payer: Self-pay

## 2013-02-02 VITALS — BP 131/82 | HR 83 | Temp 96.6°F | Ht 70.0 in | Wt 162.0 lb

## 2013-02-02 DIAGNOSIS — R110 Nausea: Secondary | ICD-10-CM

## 2013-02-02 DIAGNOSIS — K7030 Alcoholic cirrhosis of liver without ascites: Secondary | ICD-10-CM

## 2013-02-02 LAB — CBC
MCH: 33.5 pg (ref 26.0–34.0)
MCHC: 34.8 g/dL (ref 30.0–36.0)
Platelets: 118 10*3/uL — ABNORMAL LOW (ref 150–400)
RBC: 4.12 MIL/uL — ABNORMAL LOW (ref 4.22–5.81)

## 2013-02-02 LAB — PROTIME-INR: Prothrombin Time: 13.2 seconds (ref 11.6–15.2)

## 2013-02-02 MED ORDER — ESOMEPRAZOLE MAGNESIUM 40 MG PO CPDR: 40.0000 mg | DELAYED_RELEASE_CAPSULE | Freq: Two times a day (BID) | ORAL | Status: DC

## 2013-02-02 MED ORDER — ONDANSETRON HCL 4 MG PO TABS: 4.0000 mg | ORAL_TABLET | Freq: Three times a day (TID) | ORAL | Status: DC | PRN

## 2013-02-02 MED ORDER — NADOLOL 20 MG PO TABS: 40.0000 mg | ORAL_TABLET | Freq: Every day | ORAL | Status: DC

## 2013-02-02 MED ORDER — HYDROCODONE-ACETAMINOPHEN 5-325 MG PO TABS: 1.0000 | ORAL_TABLET | Freq: Four times a day (QID) | ORAL | Status: DC | PRN

## 2013-02-02 NOTE — Progress Notes (Signed)
Referring Provider: [name], MD Primary Care Physician:  [name], MD Primary GI: Dr. Jena Gauss   Chief Complaint  Patient presents with  . Nausea    alot of nausea and burping    HPI:   Peter [name] presents today with a history of colon cancer, due for surveillance Feb 2017. ETOH cirrhosis, with last [place] of abdomen in Aug 2014 without evidence of HCC. Due in Feb 2015. Up-to-date on EGD for varices. He was instructed to begin Inderal 20 mg BID at last appt, as he had not been taking.  Still notes some drainage at site of old incision. Wakes up in the morning without nausea but then gets nauseated after walking around. Heaving noted, like "water". Feels like reflux is worse.  Protonix daily but wife states has increased to twice a day. Not doing well with Protonix BID. Not eating well. Feels like he has to force it down. Early satiety. Symptoms present about a month, worsening over past few weeks. No pain with eating. No melena. No rectal bleeding. Requesting pain medication for chronic abdominal wall pain since surgery. Requesting nausea medication. Felt extremely fatigued with Inderal. Unable to tolerate. Tried it a week.   Tried and failed Prilosec and Protonix. Still hasn't completed Hepatitis vaccinations.   Past Medical History  Diagnosis Date  . Gout   . GERD (gastroesophageal reflux disease)   . Cirrhosis     ?ETOH related, afp 02/05/12= 4.9, ct on 10/16/11,no  Hep A/B vaccines  . Colon cancer Dec 2011    Cecum  . ARF (acute renal failure)     hospitalization Jan 2012  . H/O ETOH abuse   . 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 S/P endoscopy Dec 2011    Schatzki's ring, Grade 1 esophageal varices, antral/body erosions  . Anemia of chronic disease 08/10/2010  . Renal insufficiency 10/24/2010  . Schatzki's ring   . Varices, esophageal     Past Surgical History  Procedure Laterality Date  . Colon surgery  02/24/2010   colon cancer (cecum)  . Exploratory laparotomy  03/03/2010    anastomotic leak, developed EC fistula  . Exploratory laparotomy w/ bowel resection  04/18/2010    ileostomy placed, (hx of EC fistula, anastomotic leak). About two feet of ileum removed.  . Colostomy    . Percutaneous drainage of intraabdominal abscess  07/2010 and 09/2010  . Partial colectomy  10/30/2010    Procedure: PARTIAL COLECTOMY;  Surgeon: Dalia Heading;  Location: [facility];  Service: General;  Laterality: N/A;  . Ileostomy closure  10/30/2010    Procedure: ILEOSTOMY TAKEDOWN;  Surgeon: Dalia Heading;  Location: [facility];  Service: General;  Laterality: N/A;  . Colonoscopy  Dec 2011    cecal mass, tubulovillous adenoma at splenic flexure  . Esophagogastroduodenoscopy  Dec 2011    Schatzki's ring, Grade 1 esophageal varices, antral/body erosions  . Colonoscopy N/A 04/02/2012    [id number] residual rectal and colonic mucosa. Next colonoscopy in 03/2015.  Marland Kitchen Esophagogastroduodenoscopy N/A 08/06/2012    RMR: Grade 1 esophageal varices. Abnormal distal esophageal mucosa-3 short segment Barrett's-like appearance but negative biopsy for barrett's. mild erosive reflux esophagitis, non-critical Schatzki's ring, hiatal hernia, portal gastropathy.   . Esophageal banding N/A 08/06/2012    no banding     Current Outpatient Prescriptions  Medication Sig Dispense Refill  . diphenoxylate-atropine (LOMOTIL) 2.5-0.025 MG per tablet Take 1 tablet by mouth 3 (three)  times daily as needed for diarrhea or loose stools.      Marland Kitchen MILK THISTLE PO Take 240 mg by mouth daily.      . pantoprazole (PROTONIX) 40 MG tablet Take 1 tablet (40 mg total) by mouth daily.  30 tablet  11  . propranolol (INDERAL) 20 MG tablet Take 1 tablet (20 mg total) by mouth 2 (two) times daily.  60 tablet  5   No current facility-administered medications for this visit.    Allergies as of 02/02/2013 - Review Complete 02/02/2013  Allergen Reaction Noted  . Lorazepam Other (See  Comments) 08/01/2010  . Percocet [oxycodone-acetaminophen] Itching and Nausea Only 08/01/2010    Family History  Problem Relation Age of Onset  . Cirrhosis Father     deceased, secondary to ETOH    History   Social History  . Marital Status: Married    Spouse Name: N/A    Number of Children: N/A  . Years of Education: N/A   Social History Main Topics  . Smoking status: Never Smoker   . Smokeless tobacco: Current User    Types: Chew     Comment: Never smoked/ chews tobacco some  . Alcohol Use: Yes     Comment: continues to drink frequent but patient will not quantify  . Drug Use: No  . Sexual Activity: None   Other Topics Concern  . None   Social History Narrative  . None    Review of Systems: As mentioned in HPI.   Physical Exam: BP 131/82  Pulse 83  Temp(Src) 96.6 F (35.9 C) (Oral)  Ht 5\' 10"  (1.778 m)  Wt 162 lb (73.483 kg)  BMI 23.24 kg/m2 General:   Alert and oriented. No distress noted. Pleasant and cooperative.  Head:  Normocephalic and atraumatic. Eyes:  Conjuctiva clear without scleral icterus. Mouth:  Oral mucosa pink and moist. Good dentition. No lesions. Heart:  S1, S2 present without murmurs, rubs, or gallops. Regular rate and rhythm. Abdomen:  +BS, soft, non-tender and non-distended. No rebound or guarding. Inferior portion of midline incision with small area of persistent granulation tissue, no erythema or drainage  Msk:  Symmetrical without gross deformities. Normal posture. Extremities:  Without edema. Neurologic:  Alert and  oriented x4;  grossly normal neurologically. Skin:  Intact without significant lesions or rashes. Psych:  Alert and cooperative. Normal mood and affect.

## 2013-02-02 NOTE — Patient Instructions (Signed)
Instead of Propanolol, I have sent a medication called "Nadolol" to your pharmacy. I have started it at 2 tablets each morning. You may decrease this to once per day if you do not tolerate twice a day.   I have sent Zofran, a nausea medication, to your pharmacy. You may take this every 8 hours as needed. Do not take 24 hours prior to the gastric emptying study.   Please have blood work done today. You will need another ultrasound of your belly in February. Make sure you get the vaccinations done!!!!!   I referred you back to Dr. Lovell Sheehan to look at your incision.   Finally, trial Nexium twice each day. I have provided samples and sent this to the pharmacy. You may decrease this to once per day if needed.   We will see you back in 3 months or sooner if needed!

## 2013-02-03 ENCOUNTER — Other Ambulatory Visit: Payer: Self-pay | Admitting: Gastroenterology

## 2013-02-03 DIAGNOSIS — R110 Nausea: Secondary | ICD-10-CM | POA: Insufficient documentation

## 2013-02-03 LAB — HEPATIC FUNCTION PANEL
Albumin: 4.4 g/dL (ref 3.5–5.2)
Total Bilirubin: 1.6 mg/dL — ABNORMAL HIGH (ref 0.3–1.2)

## 2013-02-03 LAB — BASIC METABOLIC PANEL
Potassium: 3.8 mEq/L (ref 3.5–5.3)
Sodium: 139 mEq/L (ref 135–145)

## 2013-02-03 NOTE — Assessment & Plan Note (Signed)
Recent EGD on file. Early satiety noted, worsening reflux, question an element of delayed gastric emptying. Change PPI to Nexium BID for now. GES in near future. 3 month follow-up.

## 2013-02-03 NOTE — Assessment & Plan Note (Addendum)
Compensated, due for [place] in Feb 2015. Up-to-date on variceal surveillance. Unable to tolerate Inderal due to fatigue. Start Nadolol 40 mg daily. May need to decrease if doesn't tolerate. 3 month return. Encouraged to complete Hep A and B vaccinations as soon as possible.   As separate issue: midline incision with small area inferior portion with intermittent drainage. None at time of appt. Return back to see Dr. Lovell Sheehan.

## 2013-02-04 ENCOUNTER — Other Ambulatory Visit: Payer: Self-pay | Admitting: Gastroenterology

## 2013-02-04 DIAGNOSIS — K432 Incisional hernia without obstruction or gangrene: Secondary | ICD-10-CM

## 2013-02-04 NOTE — Progress Notes (Signed)
cc'd to pcp 

## 2013-02-05 ENCOUNTER — Encounter: Payer: Self-pay | Admitting: Internal Medicine

## 2013-02-05 ENCOUNTER — Telehealth: Payer: Self-pay | Admitting: Internal Medicine

## 2013-02-05 ENCOUNTER — Encounter (HOSPITAL_COMMUNITY): Payer: Self-pay

## 2013-02-05 ENCOUNTER — Encounter (HOSPITAL_COMMUNITY)
Admission: RE | Admit: 2013-02-05 | Discharge: 2013-02-05 | Disposition: A | Discharge status: Home / Self Care | Payer: Medicaid Other | Source: Ambulatory Visit | Attending: Gastroenterology | Admitting: Gastroenterology

## 2013-02-05 DIAGNOSIS — K219 Gastro-esophageal reflux disease without esophagitis: Secondary | ICD-10-CM | POA: Insufficient documentation

## 2013-02-05 DIAGNOSIS — R110 Nausea: Secondary | ICD-10-CM | POA: Insufficient documentation

## 2013-02-05 MED ORDER — TECHNETIUM TC 99M SULFUR COLLOID
2.0000 | Freq: Once | INTRAVENOUS | Status: AC | PRN
  Administered 2013-02-05: 2 via INTRAVENOUS

## 2013-02-05 NOTE — Telephone Encounter (Signed)
Letter has been mailed to the patient 

## 2013-02-05 NOTE — Telephone Encounter (Signed)
Jan recall has that patient needs abd ultrasound in 6 months

## 2013-02-06 ENCOUNTER — Telehealth: Payer: Self-pay | Admitting: Internal Medicine

## 2013-02-06 NOTE — Telephone Encounter (Signed)
Pt's wife called to see if the results were back yet. Please advise.

## 2013-02-06 NOTE — H&P (Signed)
Peter [name] is an 62 y.o. male.   Chief Complaint: Recurrent cellulitis of abdominal wall HPI: Patient is a 62 year old white male with an extensive complicated abdominal surgical history who presents with a recurrence tender wound along the lower aspect of the surgical scar. He states that it becomes tender in may drain cloudy fluid then healed. This continues to be recurrent nature.  Past Medical History  Diagnosis Date  . Gout   . GERD (gastroesophageal reflux disease)   . Cirrhosis     ?ETOH related, afp 02/05/12= 4.9, ct on 10/16/11,no  Hep A/B vaccines  . Colon cancer Dec 2011    Cecum  . ARF (acute renal failure)     hospitalization Jan 2012  . H/O ETOH abuse   . 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 S/P endoscopy Dec 2011    Schatzki's ring, Grade 1 esophageal varices, antral/body erosions  . Anemia of chronic disease 08/10/2010  . Renal insufficiency 10/24/2010  . Schatzki's ring   . Varices, esophageal     Past Surgical History  Procedure Laterality Date  . Colon surgery  02/24/2010    colon cancer (cecum)  . Exploratory laparotomy  03/03/2010    anastomotic leak, developed EC fistula  . Exploratory laparotomy w/ bowel resection  04/18/2010    ileostomy placed, (hx of EC fistula, anastomotic leak). About two feet of ileum removed.  . Colostomy    . Percutaneous drainage of intraabdominal abscess  07/2010 and 09/2010  . Partial colectomy  10/30/2010    Procedure: PARTIAL COLECTOMY;  Surgeon: Dalia Heading;  Location: [facility];  Service: General;  Laterality: N/A;  . Ileostomy closure  10/30/2010    Procedure: ILEOSTOMY TAKEDOWN;  Surgeon: Dalia Heading;  Location: [facility];  Service: General;  Laterality: N/A;  . Colonoscopy  Dec 2011    cecal mass, tubulovillous adenoma at splenic flexure  . Esophagogastroduodenoscopy  Dec 2011    Schatzki's ring, Grade 1 esophageal varices, antral/body erosions  . Colonoscopy N/A 04/02/2012     [id number] residual rectal and colonic mucosa. Next colonoscopy in 03/2015.  Marland Kitchen Esophagogastroduodenoscopy N/A 08/06/2012    RMR: Grade 1 esophageal varices. Abnormal distal esophageal mucosa-3 short segment Barrett's-like appearance but negative biopsy for barrett's. mild erosive reflux esophagitis, non-critical Schatzki's ring, hiatal hernia, portal gastropathy.   . Esophageal banding N/A 08/06/2012    no banding     Family History  Problem Relation Age of Onset  . Cirrhosis Father     deceased, secondary to ETOH   Social History:  reports that he has never smoked. His smokeless tobacco use includes Chew. He reports that he drinks alcohol. He reports that he does not use illicit drugs.  Allergies:  Allergies  Allergen Reactions  . Lorazepam Other (See Comments)    Causes Hallucinations, makes "crazy"  . Percocet [Oxycodone-Acetaminophen] Itching and Nausea Only    No prescriptions prior to admission    No results found for this or any previous visit (from the past 48 hour(s)). Nm Gastric Emptying  02/05/2013   CLINICAL DATA:  Nausea and reflux  EXAM: NUCLEAR MEDICINE GASTRIC EMPTYING SCAN  TECHNIQUE: After oral ingestion of radiolabeled meal, sequential abdominal images were obtained for 120 minutes. Residual percentage of activity remaining within the stomach was calculated at 60 and 120 minutes.  COMPARISON:  None.  RADIOPHARMACEUTICALS:  2 mCiTechnetium 99-M labeled sulfur colloid  FINDINGS: Expected location of the stomach in  the left upper quadrant. Ingested meal empties the stomach gradually over the course of the study with 100% % retention at 60 min and almost 100% % retention at 120 min (normal retention less than 30% at a 120 min).  IMPRESSION: Gastric emptying is abnormally delayed with only a tiny amount of activity visible in the upper small bowel with nearly the entire ingested radiopharmaceutical activity remaining in the stomach at 2 hr.   Electronically Signed   By: David   [place]   On: 02/05/2013 12:30    Review of Systems  Constitutional: Negative.   HENT: Negative.   Eyes: Negative.   Respiratory: Negative.   Cardiovascular: Negative.   Gastrointestinal:        Wound along inferior aspect of surgical scar.  Genitourinary: Negative.   Musculoskeletal: Negative.   Skin: Negative.   All other systems reviewed and are negative.    There were no vitals taken for this visit. Physical Exam  Constitutional: He is oriented to person, place, and time. He appears well-developed and well-nourished.  HENT:  Head: Normocephalic and atraumatic.  Neck: Normal range of motion. Neck supple.  Cardiovascular: Normal rate, regular rhythm and normal heart sounds.   Respiratory: Effort normal and breath sounds normal.  GI: Soft. Bowel sounds are normal. He exhibits no distension. There is no rebound.  An irritated granulomatous area of skin is noted along the inferior aspect of the midline surgical scar. No induration is noted.  Musculoskeletal: Normal range of motion.  Neurological: He is alert and oriented to person, place, and time.  Skin: Skin is warm and dry.     Assessment/Plan Impression: Cellulitis with a granuloma, abdominal wall surgical scar site Plan: Patient be brought to the operating room for excision of the granuloma, abdominal wall on 02/25/2013. The risks and benefits of the procedure were fully explained to the patient, who gave informed consent.  Elsia Lasota A 02/06/2013, 8:11 AM

## 2013-02-06 NOTE — Telephone Encounter (Signed)
Routing to AS 

## 2013-02-09 ENCOUNTER — Other Ambulatory Visit: Payer: Self-pay | Admitting: Gastroenterology

## 2013-02-09 ENCOUNTER — Telehealth: Payer: Self-pay | Admitting: Internal Medicine

## 2013-02-09 MED ORDER — METOCLOPRAMIDE HCL 10 MG PO TABS: 10.0000 mg | ORAL_TABLET | Freq: Two times a day (BID) | ORAL | Status: DC

## 2013-02-09 NOTE — Telephone Encounter (Signed)
Spoke with pt

## 2013-02-09 NOTE — Telephone Encounter (Signed)
Pt's wife called to speak with AS or JL about patient recommendations. Please advise and call her at [phone]

## 2013-02-09 NOTE — Progress Notes (Signed)
Quick Note:  Gastroparesis noted.  I want to do just a short course of Reglan 10 mg BID with breakfast and dinner. I have only given a month supply with 1 refill. Let's have him return in about 6 weeks.  Please review side effects to include involuntary tremors, drowsiness, confusion, dizziness. Needs to stop immediately if any of these occur. Please review gastroparesis diet. ______

## 2013-02-09 NOTE — Telephone Encounter (Signed)
Please see result note 

## 2013-02-09 NOTE — Telephone Encounter (Signed)
I spoke with this pt on Friday. Routing to AS for recommendations.

## 2013-02-10 NOTE — Progress Notes (Signed)
Quick Note:  MELD 11. Overall, labs at baseline. ______

## 2013-02-11 NOTE — Progress Notes (Signed)
Quick Note:  Called and informed pt's wife. ______ 

## 2013-02-17 ENCOUNTER — Encounter (HOSPITAL_COMMUNITY)
Admission: RE | Admit: 2013-02-17 | Discharge: 2013-02-17 | Disposition: A | Discharge status: Home / Self Care | Payer: Medicaid Other | Source: Ambulatory Visit | Attending: General Surgery | Admitting: General Surgery

## 2013-02-17 ENCOUNTER — Other Ambulatory Visit: Payer: Self-pay

## 2013-02-17 ENCOUNTER — Encounter (HOSPITAL_COMMUNITY): Payer: Self-pay

## 2013-02-17 ENCOUNTER — Encounter (HOSPITAL_COMMUNITY): Payer: Self-pay | Admitting: Pharmacy Technician

## 2013-02-17 DIAGNOSIS — Z01818 Encounter for other preprocedural examination: Secondary | ICD-10-CM | POA: Insufficient documentation

## 2013-02-17 DIAGNOSIS — Z01810 Encounter for preprocedural cardiovascular examination: Secondary | ICD-10-CM | POA: Insufficient documentation

## 2013-02-17 DIAGNOSIS — Z01812 Encounter for preprocedural laboratory examination: Secondary | ICD-10-CM | POA: Insufficient documentation

## 2013-02-17 NOTE — Patient Instructions (Signed)
    Peter [name]  02/17/2013   Your procedure is scheduled on:   02/25/2013  Report to [facility] at  720  AM.  Call this number if you have problems the morning of surgery: [phone]   Remember:   Do not eat food or drink liquids after midnight.   Take these medicines the morning of surgery with A SIP OF WATER:  Nexium, norco, reglan, nadolol, zofran, inderal   Do not wear jewelry, make-up or nail polish.  Do not wear lotions, powders, or perfumes.   Do not shave 48 hours prior to surgery. Men may shave face and neck.  Do not bring valuables to the hospital.  [facility] is not responsible for any belongings or valuables.               Contacts, dentures or bridgework may not be worn into surgery.  Leave suitcase in the car. After surgery it may be brought to your room.  For patients admitted to the hospital, discharge time is determined by your treatment team.               Patients discharged the day of surgery will not be allowed to drive home.  Name and phone number of your driver: family  Special Instructions: Shower using CHG 2 nights before surgery and the night before surgery.  If you shower the day of surgery use CHG.  Use special wash - you have one bottle of CHG for all showers.  You should use approximately 1/3 of the bottle for each shower.   Please read over the following fact sheets that you were given: Pain Booklet, Coughing and Deep Breathing, Surgical Site Infection Prevention, Anesthesia Post-op Instructions and Care and Recovery After Surgery PATIENT INSTRUCTIONS POST-ANESTHESIA  IMMEDIATELY FOLLOWING SURGERY:  Do not drive or operate machinery for the first twenty four hours after surgery.  Do not make any important decisions for twenty four hours after surgery or while taking narcotic pain medications or sedatives.  If you develop intractable nausea and vomiting or a severe headache please notify your doctor immediately.  FOLLOW-UP:  Please make an  appointment with your surgeon as instructed. You do not need to follow up with anesthesia unless specifically instructed to do so.  WOUND CARE INSTRUCTIONS (if applicable):  Keep a dry clean dressing on the anesthesia/puncture wound site if there is drainage.  Once the wound has quit draining you may leave it open to air.  Generally you should leave the bandage intact for twenty four hours unless there is drainage.  If the epidural site drains for more than 36-48 hours please call the anesthesia department.  QUESTIONS?:  Please feel free to call your physician or the hospital operator if you have any questions, and they will be happy to assist you.

## 2013-02-23 ENCOUNTER — Other Ambulatory Visit: Payer: Self-pay | Admitting: Internal Medicine

## 2013-02-23 DIAGNOSIS — K7030 Alcoholic cirrhosis of liver without ascites: Secondary | ICD-10-CM

## 2013-02-25 ENCOUNTER — Encounter (HOSPITAL_COMMUNITY): Payer: Medicaid Other | Admitting: Anesthesiology

## 2013-02-25 ENCOUNTER — Encounter (HOSPITAL_COMMUNITY)
Admission: RE | Disposition: A | Discharge status: Home / Self Care | Payer: Self-pay | Source: Ambulatory Visit | Attending: General Surgery

## 2013-02-25 ENCOUNTER — Ambulatory Visit (HOSPITAL_COMMUNITY)
Admission: RE | Admit: 2013-02-25 | Discharge: 2013-02-25 | Disposition: A | Discharge status: Home / Self Care | Payer: Medicaid Other | Source: Ambulatory Visit | Attending: General Surgery | Admitting: General Surgery

## 2013-02-25 ENCOUNTER — Ambulatory Visit (HOSPITAL_COMMUNITY): Payer: Medicaid Other | Admitting: Anesthesiology

## 2013-02-25 DIAGNOSIS — Y838 Other surgical procedures as the cause of abnormal reaction of the patient, or of later complication, without mention of misadventure at the time of the procedure: Secondary | ICD-10-CM | POA: Insufficient documentation

## 2013-02-25 DIAGNOSIS — IMO0002 Reserved for concepts with insufficient information to code with codable children: Secondary | ICD-10-CM | POA: Insufficient documentation

## 2013-02-25 HISTORY — PX: WOUND EXPLORATION: SHX6188

## 2013-02-25 SURGERY — WOUND EXPLORATION
Anesthesia: Monitor Anesthesia Care

## 2013-02-25 MED ORDER — MIDAZOLAM HCL 2 MG/2ML IJ SOLN
INTRAMUSCULAR | Status: AC
  Filled 2013-02-25: qty 2

## 2013-02-25 MED ORDER — FENTANYL CITRATE 0.05 MG/ML IJ SOLN
INTRAMUSCULAR | Status: AC
  Filled 2013-02-25: qty 2

## 2013-02-25 MED ORDER — FENTANYL CITRATE 0.05 MG/ML IJ SOLN
25.0000 ug | INTRAMUSCULAR | Status: AC
  Administered 2013-02-25 (×2): 25 ug via INTRAVENOUS

## 2013-02-25 MED ORDER — ONDANSETRON HCL 4 MG/2ML IJ SOLN
INTRAMUSCULAR | Status: AC
  Filled 2013-02-25: qty 2

## 2013-02-25 MED ORDER — PROPOFOL 10 MG/ML IV EMUL
INTRAVENOUS | Status: AC
  Filled 2013-02-25: qty 20

## 2013-02-25 MED ORDER — LACTATED RINGERS IV SOLN
INTRAVENOUS | Status: DC | PRN
  Administered 2013-02-25: 09:00:00 via INTRAVENOUS

## 2013-02-25 MED ORDER — MIDAZOLAM HCL 2 MG/2ML IJ SOLN
1.0000 mg | INTRAMUSCULAR | Status: DC | PRN
  Administered 2013-02-25: 2 mg via INTRAVENOUS

## 2013-02-25 MED ORDER — CHLORHEXIDINE GLUCONATE 4 % EX LIQD: 1.0000 "application " | Freq: Once | CUTANEOUS | Status: DC

## 2013-02-25 MED ORDER — HYDROCODONE-ACETAMINOPHEN 5-325 MG PO TABS: 1.0000 | ORAL_TABLET | ORAL | Status: DC | PRN

## 2013-02-25 MED ORDER — CLINDAMYCIN PHOSPHATE 600 MG/50ML IV SOLN
INTRAVENOUS | Status: AC
  Filled 2013-02-25: qty 50

## 2013-02-25 MED ORDER — PROPOFOL INFUSION 10 MG/ML OPTIME
INTRAVENOUS | Status: DC | PRN
  Administered 2013-02-25: 100 ug/kg/min via INTRAVENOUS

## 2013-02-25 MED ORDER — LACTATED RINGERS IV SOLN
INTRAVENOUS | Status: DC
  Administered 2013-02-25: 09:00:00 via INTRAVENOUS

## 2013-02-25 MED ORDER — KETOROLAC TROMETHAMINE 30 MG/ML IJ SOLN
30.0000 mg | Freq: Once | INTRAMUSCULAR | Status: AC
  Administered 2013-02-25: 30 mg via INTRAVENOUS
  Filled 2013-02-25: qty 1

## 2013-02-25 MED ORDER — LIDOCAINE HCL 1 % IJ SOLN
INTRAMUSCULAR | Status: DC | PRN
  Administered 2013-02-25: 6 mL

## 2013-02-25 MED ORDER — FENTANYL CITRATE 0.05 MG/ML IJ SOLN
25.0000 ug | INTRAMUSCULAR | Status: DC | PRN
  Administered 2013-02-25 (×2): 50 ug via INTRAVENOUS

## 2013-02-25 MED ORDER — SODIUM CHLORIDE 0.9 % IR SOLN
Status: DC | PRN
  Administered 2013-02-25: 1000 mL

## 2013-02-25 MED ORDER — GLYCOPYRROLATE 0.2 MG/ML IJ SOLN
0.2000 mg | Freq: Once | INTRAMUSCULAR | Status: AC
  Administered 2013-02-25: 0.2 mg via INTRAVENOUS

## 2013-02-25 MED ORDER — POVIDONE-IODINE 10 % OINT PACKET
TOPICAL_OINTMENT | CUTANEOUS | Status: DC | PRN
  Administered 2013-02-25: 1 via TOPICAL

## 2013-02-25 MED ORDER — LIDOCAINE HCL (PF) 1 % IJ SOLN
INTRAMUSCULAR | Status: AC
  Filled 2013-02-25: qty 30

## 2013-02-25 MED ORDER — CLINDAMYCIN PHOSPHATE 600 MG/50ML IV SOLN
600.0000 mg | Freq: Once | INTRAVENOUS | Status: AC
  Administered 2013-02-25: 600 mg via INTRAVENOUS

## 2013-02-25 MED ORDER — GLYCOPYRROLATE 0.2 MG/ML IJ SOLN
INTRAMUSCULAR | Status: AC
  Filled 2013-02-25: qty 1

## 2013-02-25 MED ORDER — POVIDONE-IODINE 10 % EX OINT
TOPICAL_OINTMENT | CUTANEOUS | Status: AC
  Filled 2013-02-25: qty 1

## 2013-02-25 MED ORDER — ONDANSETRON HCL 4 MG/2ML IJ SOLN
4.0000 mg | Freq: Once | INTRAMUSCULAR | Status: AC | PRN
  Administered 2013-02-25: 4 mg via INTRAVENOUS

## 2013-02-25 MED ORDER — NADOLOL 20 MG PO TABS
20.0000 mg | ORAL_TABLET | Freq: Once | ORAL | Status: AC
  Administered 2013-02-25: 20 mg via ORAL
  Filled 2013-02-25: qty 1

## 2013-02-25 SURGICAL SUPPLY — 28 items
BAG HAMPER (MISCELLANEOUS) ×3 IMPLANT
CLOTH BEACON ORANGE TIMEOUT ST (SAFETY) ×3 IMPLANT
COVER LIGHT HANDLE STERIS (MISCELLANEOUS) ×6 IMPLANT
DECANTER SPIKE VIAL GLASS SM (MISCELLANEOUS) ×6 IMPLANT
ELECT REM PT RETURN 9FT ADLT (ELECTROSURGICAL) ×3
ELECTRODE REM PT RTRN 9FT ADLT (ELECTROSURGICAL) ×1 IMPLANT
GLOVE BIO SURGEON STRL SZ7.5 (GLOVE) ×6 IMPLANT
GLOVE BIOGEL PI IND STRL 7.0 (GLOVE) ×3 IMPLANT
GLOVE BIOGEL PI IND STRL 8 (GLOVE) ×1 IMPLANT
GLOVE BIOGEL PI INDICATOR 7.0 (GLOVE) ×6
GLOVE BIOGEL PI INDICATOR 8 (GLOVE) ×2
GLOVE ECLIPSE 7.0 STRL STRAW (GLOVE) ×3 IMPLANT
GLOVE SS BIOGEL STRL SZ 6.5 (GLOVE) ×1 IMPLANT
GLOVE SUPERSENSE BIOGEL SZ 6.5 (GLOVE) ×2
GOWN STRL REIN XL XLG (GOWN DISPOSABLE) ×9 IMPLANT
INST SET MINOR GENERAL (KITS) ×3 IMPLANT
KIT ROOM TURNOVER APOR (KITS) ×3 IMPLANT
MANIFOLD NEPTUNE II (INSTRUMENTS) ×3 IMPLANT
NS IRRIG 1000ML POUR BTL (IV SOLUTION) ×3 IMPLANT
PACK MINOR (CUSTOM PROCEDURE TRAY) ×3 IMPLANT
PAD ARMBOARD 7.5X6 YLW CONV (MISCELLANEOUS) ×3 IMPLANT
SET BASIN LINEN APH (SET/KITS/TRAYS/PACK) ×3 IMPLANT
SPONGE GAUZE 4X4 12PLY (GAUZE/BANDAGES/DRESSINGS) ×3 IMPLANT
SPONGE LAP 18X18 X RAY DECT (DISPOSABLE) ×3 IMPLANT
SUT PROLENE 3 0 PS 2 (SUTURE) ×6 IMPLANT
SYR BULB IRRIGATION 50ML (SYRINGE) ×3 IMPLANT
SYR CONTROL 10ML LL (SYRINGE) ×3 IMPLANT
TAPE CLOTH SURG 4X10 WHT LF (GAUZE/BANDAGES/DRESSINGS) ×3 IMPLANT

## 2013-02-25 NOTE — Anesthesia Preprocedure Evaluation (Signed)
Anesthesia Evaluation  Patient identified by MRN, date of birth, ID band Patient awake    Reviewed: Allergy & Precautions, H&P , NPO status , Patient's Chart, lab work & pertinent test results  History of Anesthesia Complications Negative for: history of anesthetic complications  Airway Mallampati: II  Neck ROM: Full    Dental  (+) Teeth Intact, Implants, Poor Dentition, Missing and Dental Advisory Given   Pulmonary neg pulmonary ROS,    Pulmonary exam normal       Cardiovascular negative cardio ROS  Rhythm:Regular Rate:Normal     Neuro/Psych    GI/Hepatic GERD-  Medicated and Controlled,(+) Cirrhosis -  Esophageal Varices  substance abuse  alcohol use, Hx ETOH   Endo/Other    Renal/GU ARF and CRFRenal diseaseDehydration due to high output from ileostomy fitula.     Musculoskeletal   Abdominal   Peds  Hematology  (+) Blood dyscrasia, anemia ,   Anesthesia Other Findings   Reproductive/Obstetrics                           Anesthesia Physical Anesthesia Plan  ASA: III  Anesthesia Plan: MAC   Post-op Pain Management:    Induction: Intravenous  Airway Management Planned: Nasal Cannula  Additional Equipment:   Intra-op Plan:   Post-operative Plan:   Informed Consent: I have reviewed the patients History and Physical, chart, labs and discussed the procedure including the risks, benefits and alternatives for the proposed anesthesia with the patient or authorized representative who has indicated his/her understanding and acceptance.   Dental advisory given  Plan Discussed with: Surgeon and CRNA  Anesthesia Plan Comments: (Possible Gen-LMA discussed and he agrees.)        Anesthesia Quick Evaluation

## 2013-02-25 NOTE — Discharge Instructions (Signed)
Wash wound twice a day with soap and water.  Apply antibiotic ointment twice a day.

## 2013-02-25 NOTE — Anesthesia Postprocedure Evaluation (Signed)
  Anesthesia Post-op Note  Patient: Peter [name]  Procedure(s) Performed: Procedure(s): EXCISION OF SUTURE GRANULOMA ABDOMINAL WALL (N/A)  Patient Location: PACU  Anesthesia Type:MAC  Level of Consciousness: awake, alert  and oriented  Airway and Oxygen Therapy: Patient Spontanous Breathing  Post-op Pain: none  Post-op Assessment: Post-op Vital signs reviewed, Patient's Cardiovascular Status Stable, Respiratory Function Stable, Patent Airway and No signs of Nausea or vomiting  Post-op Vital Signs: Reviewed and stable  Complications: No apparent anesthesia complications

## 2013-02-25 NOTE — Op Note (Signed)
Patient:  Peter [name]  DOB:  May 11, 1950  MRN:  [id number]   Preop Diagnosis:  Granuloma, abdominal wall  Postop Diagnosis:  Same, suture granuloma  Procedure:  Excision of suture granuloma, abdominal wall  Surgeon:  Aviva Signs, M.D.  Anes:  MAC  Indications:  Patient is a 63 year old white male is had multiple abdominal surgeries in the past who presents with a persistent draining wound along the inferior aspect of his midline incision. The patient now comes the operating room for wound exploration and excision of the granuloma. The risks and benefits of the procedure including bleeding, infection, and recurrence of the granuloma were fully explained to the patient, who gave informed consent.  Procedure note:  The patient is placed the supine position. After anesthesia was administered, the lower abdomen was prepped and draped using usual sterile technique with Betadine. Surgical site confirmation was performed. 1% Xylocaine was used for local anesthesia.  An elliptical incision was made transversely around the draining wound. The dissection was taken down to the fascia where a suture knot had been tied.  It appeared to be in 0 Prolene suture. It was removed in total without difficulty. A bleeding was controlled using Bovie electrocautery. The skin was reapproximated using 3-0 Prolene vertical mattress sutures. Betadine ointment and dry sterile dressings were applied.  All tape and needle counts were correct the end of the procedure. Patient was awakened and transferred to PACU in stable condition.  Complications:  None  EBL:  Minimal  Specimen:  Suture, disposed of

## 2013-02-25 NOTE — Preoperative (Signed)
Beta Blockers   Reason not to administer Beta Blockers:Not Applicable 

## 2013-02-25 NOTE — Interval H&P Note (Signed)
History and Physical Interval Note:  02/25/2013 8:44 AM  Peter [name]  has presented today for surgery, with the diagnosis of cellulitis of abdominal wall  The various methods of treatment have been discussed with the patient and family. After consideration of risks, benefits and other options for treatment, the patient has consented to  Procedure(s): WOUND EXPLORATION OF ABDOMINAL WALL (N/A) as a surgical intervention .  The patient's history has been reviewed, patient examined, no change in status, stable for surgery.  I have reviewed the patient's chart and labs.  Questions were answered to the patient's satisfaction.     Aviva Signs A

## 2013-02-25 NOTE — Interval H&P Note (Signed)
History and Physical Interval Note:  02/25/2013 8:44 AM  Peter [name]  has presented today for surgery, with the diagnosis of cellulitis of abdominal wall  The various methods of treatment have been discussed with the patient and family. After consideration of risks, benefits and other options for treatment, the patient has consented to  Procedure(s): WOUND EXPLORATION OF ABDOMINAL WALL (N/A) as a surgical intervention .  The patient's history has been reviewed, patient examined, no change in status, stable for surgery.  I have reviewed the patient's chart and labs.  Questions were answered to the patient's satisfaction.     Holleigh Crihfield A   

## 2013-02-25 NOTE — Anesthesia Procedure Notes (Signed)
Procedure Name: MAC Date/Time: 02/25/2013 9:19 AM Performed by: Jon Gills A Pre-anesthesia Checklist: Patient identified, Emergency Drugs available, Suction available and Patient being monitored Patient Re-evaluated:Patient Re-evaluated prior to inductionOxygen Delivery Method: Circle system utilized and Nasal cannula Placement Confirmation: positive ETCO2

## 2013-02-25 NOTE — Transfer of Care (Signed)
Immediate Anesthesia Transfer of Care Note  Patient: Peter [name]  Procedure(s) Performed: Procedure(s): EXCISION OF SUTURE GRANULOMA ABDOMINAL WALL (N/A)  Patient Location: PACU  Anesthesia Type:MAC  Level of Consciousness: awake, alert  and oriented  Airway & Oxygen Therapy: Patient Spontanous Breathing and Patient connected to nasal cannula oxygen  Post-op Assessment: Report given to PACU RN  Post vital signs: Reviewed and stable  Complications: No apparent anesthesia complications

## 2013-02-27 ENCOUNTER — Encounter (HOSPITAL_COMMUNITY): Payer: Self-pay | Admitting: General Surgery

## 2013-03-06 ENCOUNTER — Ambulatory Visit (HOSPITAL_COMMUNITY)
Admission: RE | Admit: 2013-03-06 | Discharge: 2013-03-06 | Disposition: A | Discharge status: Home / Self Care | Payer: Medicaid Other | Source: Ambulatory Visit | Attending: Internal Medicine | Admitting: Internal Medicine

## 2013-03-06 DIAGNOSIS — K7030 Alcoholic cirrhosis of liver without ascites: Secondary | ICD-10-CM

## 2013-03-06 DIAGNOSIS — R161 Splenomegaly, not elsewhere classified: Secondary | ICD-10-CM | POA: Insufficient documentation

## 2013-03-06 DIAGNOSIS — R160 Hepatomegaly, not elsewhere classified: Secondary | ICD-10-CM | POA: Insufficient documentation

## 2013-03-06 DIAGNOSIS — R188 Other ascites: Secondary | ICD-10-CM | POA: Insufficient documentation

## 2013-03-09 ENCOUNTER — Telehealth: Payer: Self-pay | Admitting: *Deleted

## 2013-03-09 NOTE — Telephone Encounter (Signed)
Is he taking Reglan that I sent in? Increase this to three times a day with meals if he is taking it already. If not, he needs to start. Needs to be on Nexium BID.

## 2013-03-09 NOTE — Telephone Encounter (Signed)
Pt has not been eating, pt only eating very little, pt's wife called wanting to know if he can get something to make him want to eat. Pt's wife also wants to know if pt's ultrasound is back. Please advise [phone] pt's wife will be out of the house from 10-2 wife states if she misses our call she will call back.

## 2013-03-09 NOTE — Telephone Encounter (Signed)
I spoke to pt's wife- she stated did not take the reglan because he was afraid of the side effects. He is taking nexium bid, zofran and diarrhea medication. She said he also holds all food or liquids in his mouth for a minute or two before he swallows it, when she asks him if he is having difficulty swallowing, he denies any problems swallowing.   Pt has not been drinking alcohol as much and now he is shaking a lot and pts wife wants to know if we can send in rx for xanax 0.25mg  to help him through this.

## 2013-03-09 NOTE — Telephone Encounter (Signed)
Spoke with pts wife- pt is barely eating anything all day. He will only take a few bites of his food and then says hes not hungry. He was eating 2 meals a day and now she can barely get him to eat 1. This has been going on for a few weeks and seems to be getting worse. He tells her that he has no appetite and nothing tastes good and he still has some nausea. He is taking the zofran and it doesn't seem to be helping.   They want to know if there is a medication he could take to help his appetite.

## 2013-03-09 NOTE — Telephone Encounter (Signed)
Tried to call pt's wife- LMOM 

## 2013-03-10 NOTE — Telephone Encounter (Signed)
pts wife is aware. 

## 2013-03-10 NOTE — Telephone Encounter (Signed)
I would prefer he get the xanax from PCP. Needs to start Reglan BID. I am SO GLAD he is cutting back on drinking!

## 2013-03-18 ENCOUNTER — Telehealth: Payer: Self-pay | Admitting: Internal Medicine

## 2013-03-18 NOTE — Telephone Encounter (Signed)
Pt is on Feb recall list to have [place] of abd done in 03/2013. I seen where he had one done in January. Does he still need another one?

## 2013-03-18 NOTE — Telephone Encounter (Signed)
U/S of Abd was done 03/06/13 and another will need to be done in 6 months

## 2013-03-19 NOTE — Telephone Encounter (Signed)
Reminder in epic °

## 2013-03-23 ENCOUNTER — Ambulatory Visit: Payer: Medicaid Other | Admitting: Gastroenterology

## 2013-04-08 ENCOUNTER — Other Ambulatory Visit (HOSPITAL_COMMUNITY): Payer: Medicaid Other

## 2013-04-09 ENCOUNTER — Ambulatory Visit: Payer: Medicaid Other | Admitting: Gastroenterology

## 2013-04-10 ENCOUNTER — Ambulatory Visit (HOSPITAL_COMMUNITY): Payer: Medicaid Other

## 2013-04-11 NOTE — Progress Notes (Signed)
This encounter was created in error - please disregard.

## 2013-04-14 ENCOUNTER — Other Ambulatory Visit (HOSPITAL_COMMUNITY): Payer: Medicaid Other

## 2013-04-16 ENCOUNTER — Ambulatory Visit (HOSPITAL_COMMUNITY): Payer: Medicaid Other

## 2013-04-17 ENCOUNTER — Encounter (HOSPITAL_COMMUNITY): Payer: Medicaid Other | Attending: Hematology and Oncology

## 2013-04-17 DIAGNOSIS — C189 Malignant neoplasm of colon, unspecified: Secondary | ICD-10-CM | POA: Insufficient documentation

## 2013-04-17 LAB — CEA: CEA: 3.4 ng/mL (ref 0.0–5.0)

## 2013-04-17 LAB — COMPREHENSIVE METABOLIC PANEL
ALK PHOS: 84 U/L (ref 39–117)
ALT: 53 U/L (ref 0–53)
AST: 137 U/L — ABNORMAL HIGH (ref 0–37)
Albumin: 3.9 g/dL (ref 3.5–5.2)
BUN: 9 mg/dL (ref 6–23)
CHLORIDE: 100 meq/L (ref 96–112)
CO2: 31 mEq/L (ref 19–32)
Calcium: 9 mg/dL (ref 8.4–10.5)
Creatinine, Ser: 1.28 mg/dL (ref 0.50–1.35)
GFR calc non Af Amer: 59 mL/min — ABNORMAL LOW (ref >90)
GFR, EST AFRICAN AMERICAN: 69 mL/min — AB (ref >90)
GLUCOSE: 95 mg/dL (ref 70–99)
POTASSIUM: 3.1 meq/L — AB (ref 3.7–5.3)
SODIUM: 144 meq/L (ref 137–147)
TOTAL PROTEIN: 7.3 g/dL (ref 6.0–8.3)
Total Bilirubin: 1.7 mg/dL — ABNORMAL HIGH (ref 0.3–1.2)

## 2013-04-17 LAB — CBC WITH DIFFERENTIAL/PLATELET
Basophils Absolute: 0 10*3/uL (ref 0.0–0.1)
Basophils Relative: 1 % (ref 0–1)
Eosinophils Absolute: 0.1 10*3/uL (ref 0.0–0.7)
Eosinophils Relative: 2 % (ref 0–5)
HCT: 37.5 % — ABNORMAL LOW (ref 39.0–52.0)
Hemoglobin: 12.7 g/dL — ABNORMAL LOW (ref 13.0–17.0)
LYMPHS ABS: 1.2 10*3/uL (ref 0.7–4.0)
LYMPHS PCT: 34 % (ref 12–46)
MCH: 34 pg (ref 26.0–34.0)
MCHC: 33.9 g/dL (ref 30.0–36.0)
MCV: 100.5 fL — ABNORMAL HIGH (ref 78.0–100.0)
Monocytes Absolute: 0.4 10*3/uL (ref 0.1–1.0)
Monocytes Relative: 10 % (ref 3–12)
NEUTROS ABS: 1.9 10*3/uL (ref 1.7–7.7)
NEUTROS PCT: 53 % (ref 43–77)
PLATELETS: 105 10*3/uL — AB (ref 150–400)
RBC: 3.73 MIL/uL — AB (ref 4.22–5.81)
RDW: 15.7 % — ABNORMAL HIGH (ref 11.5–15.5)
WBC: 3.6 10*3/uL — AB (ref 4.0–10.5)

## 2013-04-17 NOTE — Progress Notes (Signed)
Labs drawn today for cbc/diff,cmp,cea 

## 2013-04-20 ENCOUNTER — Encounter (HOSPITAL_COMMUNITY): Payer: Self-pay

## 2013-04-20 ENCOUNTER — Encounter (HOSPITAL_COMMUNITY): Payer: Medicaid Other | Attending: Hematology and Oncology

## 2013-04-20 VITALS — BP 108/72 | HR 82 | Temp 97.6°F | Resp 16 | Wt 161.0 lb

## 2013-04-20 DIAGNOSIS — K7460 Unspecified cirrhosis of liver: Secondary | ICD-10-CM

## 2013-04-20 DIAGNOSIS — C189 Malignant neoplasm of colon, unspecified: Secondary | ICD-10-CM | POA: Insufficient documentation

## 2013-04-20 DIAGNOSIS — D6959 Other secondary thrombocytopenia: Secondary | ICD-10-CM

## 2013-04-20 DIAGNOSIS — D731 Hypersplenism: Secondary | ICD-10-CM

## 2013-04-20 NOTE — Patient Instructions (Signed)
[facility] Discharge Instructions  RECOMMENDATIONS MADE BY THE CONSULTANT AND ANY TEST RESULTS WILL BE SENT TO YOUR REFERRING PHYSICIAN.  EXAM FINDINGS BY THE PHYSICIAN TODAY AND SIGNS OR SYMPTOMS TO REPORT TO CLINIC OR PRIMARY PHYSICIAN: Exam and findings as discussed by Dr. Barnet Glasgow.  Blood work is stable.  Report changes in bowel habits or other problems.  MEDICATIONS PRESCRIBED:  none  INSTRUCTIONS/FOLLOW-UP: Follow-up with blood work and office visit in 6 months.  Thank you for choosing [facility] to provide your oncology and hematology care.  To afford each patient quality time with our providers, please arrive at least 15 minutes before your scheduled appointment time.  With your help, our goal is to use those 15 minutes to complete the necessary work-up to ensure our physicians have the information they need to help with your evaluation and healthcare recommendations.    Effective January 1st, 2014, we ask that you re-schedule your appointment with our physicians should you arrive 10 or more minutes late for your appointment.  We strive to give you quality time with our providers, and arriving late affects you and other patients whose appointments are after yours.    Again, thank you for choosing [facility].  Our hope is that these requests will decrease the amount of time that you wait before being seen by our physicians.       _____________________________________________________________  Should you have questions after your visit to [facility], please contact our office at [phone] between the hours of 8:30 a.m. and 5:00 p.m.  Voicemails left after 4:30 p.m. will not be returned until the following business day.  For prescription refill requests, have your pharmacy contact our office with your prescription refill request.

## 2013-04-20 NOTE — Progress Notes (Signed)
[facility]  OFFICE PROGRESS NOTE  Lanette Hampshire, MD [address] [place] [address]  DIAGNOSIS: Colon cancer  Cirrhosis of liver without mention of alcohol  Hypersplenism syndrome  Thrombocytopenia due to hypersplenism  Chief Complaint  Patient presents with  . Colon Cancer    CURRENT THERAPY: Watchful expectation.  INTERVAL HISTORY: Peter [name] 63 y.o. male returns for followup of stage I colon cancer in the setting of cirrhosis of the liver with hypersplenism and mild thrombocytopenia. He is hard of hearing. Appetite is good with no nausea, vomiting, with occasional epigastric discomfort but no dysphagia or odynophagia. He denies any diarrhea, melena, hematochezia, hematuria, constipation, fever, night sweats, abdominal distention, lower extremity swelling or redness, PND, orthopnea, palpitations, headache, or seizures.   MEDICAL HISTORY: Past Medical History  Diagnosis Date  . Gout   . GERD (gastroesophageal reflux disease)   . Cirrhosis     ?ETOH related, afp 02/05/12= 4.9, ct on 10/16/11,no  Hep A/B vaccines  . Colon cancer Dec 2011    Cecum  . ARF (acute renal failure)     hospitalization Jan 2012  . H/O ETOH abuse   . 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 S/P endoscopy Dec 2011    Schatzki's ring, Grade 1 esophageal varices, antral/body erosions  . Anemia of chronic disease 08/10/2010  . Renal insufficiency 10/24/2010  . Schatzki's ring   . Varices, esophageal     INTERIM HISTORY: has Alcoholic cirrhosis of liver; EPIGASTRIC PAIN; TRANSAMINASES, SERUM, ELEVATED; Personal history of colon cancer; Dehydration; Colon cancer; Anemia of chronic disease; Diarrhea; Weight loss, abnormal; Renal insufficiency; Retroperitoneal lymphadenopathy; Esophageal varices without bleeding; Chronic diarrhea; GERD (gastroesophageal reflux disease); Nausea alone; Hypersplenism  syndrome; and Thrombocytopenia due to hypersplenism on his problem list.   Stage I colon cancer and there was diagnosed in January 2012 . Following resection of the colon. Pathology showed T1 N0 with 18/18 lymph nodes negative for evidence of malignancy. Prior to this, patient was noted to have an cecal mass that was biopsied on 02/07/2010 which showed tubulovillous adenoma with high-grade dysplasia and focal area suspicious for invasive carcinoma.  Last surveillance colonoscopy on 04/02/2012 showed normal residual rectal or colonic mucosa and repeat surveillance colonoscopy in 3 years was recommended by Dr. Garfield Cornea. He had a CT scan in August of 2013 which did not show any evidence of malignancy  ALLERGIES:  is allergic to ace inhibitors; lorazepam; and percocet.  MEDICATIONS: has a current medication list which includes the following prescription(s): diphenoxylate-atropine, esomeprazole, hydrocodone-acetaminophen, metoclopramide, milk thistle, nadolol, and ondansetron.  SURGICAL HISTORY:  Past Surgical History  Procedure Laterality Date  . Colon surgery  02/24/2010    colon cancer (cecum)  . Exploratory laparotomy  03/03/2010    anastomotic leak, developed EC fistula  . Exploratory laparotomy w/ bowel resection  04/18/2010    ileostomy placed, (hx of EC fistula, anastomotic leak). About two feet of ileum removed.  . Colostomy    . Percutaneous drainage of intraabdominal abscess  07/2010 and 09/2010  . Partial colectomy  10/30/2010    Procedure: PARTIAL COLECTOMY;  Surgeon: Jamesetta So;  Location: [facility];  Service: General;  Laterality: N/A;  . Ileostomy closure  10/30/2010    Procedure: ILEOSTOMY TAKEDOWN;  Surgeon: Jamesetta So;  Location: [facility];  Service: General;  Laterality: N/A;  . Colonoscopy  Dec 2011  cecal mass, tubulovillous adenoma at splenic flexure  . Esophagogastroduodenoscopy  Dec 2011    Schatzki's ring, Grade 1 esophageal varices, antral/body erosions  .  Colonoscopy N/A 04/02/2012    WVP:XTGGYI residual rectal and colonic mucosa. Next colonoscopy in 03/2015.  Marland Kitchen Esophagogastroduodenoscopy N/A 08/06/2012    RMR: Grade 1 esophageal varices. Abnormal distal esophageal mucosa-3 short segment Barrett's-like appearance but negative biopsy for barrett's. mild erosive reflux esophagitis, non-critical Schatzki's ring, hiatal hernia, portal gastropathy.   . Esophageal banding N/A 08/06/2012    no banding   . Wound exploration N/A 02/25/2013    Procedure: EXCISION OF SUTURE GRANULOMA ABDOMINAL WALL;  Surgeon: Jamesetta So, MD;  Location: [facility];  Service: General;  Laterality: N/A;    FAMILY HISTORY: family history includes Cirrhosis in his father.  SOCIAL HISTORY:  reports that he has never smoked. His smokeless tobacco use includes Chew. He reports that he drinks alcohol. He reports that he does not use illicit drugs.  REVIEW OF SYSTEMS:  Other than that discussed above is noncontributory.  PHYSICAL EXAMINATION: ECOG PERFORMANCE STATUS: 1 - Symptomatic but completely ambulatory  There were no vitals taken for this visit.  GENERAL:alert, no distress and comfortable SKIN: skin color, texture, turgor are normal, no rashes or significant lesions EYES: PERLA; Conjunctiva are pink and non-injected, sclera clear OROPHARYNX:no exudate, no erythema on lips, buccal mucosa, or tongue. NECK: supple, thyroid normal size, non-tender, without nodularity. No masses CHEST: Increased AP diameter with spider angiomata and bilateral gynecomastia. LYMPH:  no palpable lymphadenopathy in the cervical, axillary or inguinal LUNGS: clear to auscultation and percussion with normal breathing effort HEART: regular rate & rhythm and no murmurs. ABDOMEN:abdomen soft, non-tender and normal bowel sounds. Spleen tip palpable. No free fluid wave or shifting dullness. MUSCULOSKELETAL:no cyanosis of digits and no clubbing. Range of motion normal.  NEURO: alert & oriented x 3 with  fluent speech, no focal motor/sensory deficits. Decreased hearing acuity. No evidence of asterixis.   LABORATORY DATA: Infusion on 04/17/2013  Component Date Value Ref Range Status  . WBC 04/17/2013 3.6* 4.0 - 10.5 K/uL Final  . RBC 04/17/2013 3.73* 4.22 - 5.81 MIL/uL Final  . Hemoglobin 04/17/2013 12.7* 13.0 - 17.0 g/dL Final  . HCT 04/17/2013 37.5* 39.0 - 52.0 % Final  . MCV 04/17/2013 100.5* 78.0 - 100.0 fL Final  . MCH 04/17/2013 34.0  26.0 - 34.0 pg Final  . MCHC 04/17/2013 33.9  30.0 - 36.0 g/dL Final  . RDW 04/17/2013 15.7* 11.5 - 15.5 % Final  . Platelets 04/17/2013 105* 150 - 400 K/uL Final   CONSISTENT WITH PREVIOUS RESULT  . Neutrophils Relative % 04/17/2013 53  43 - 77 % Final  . Neutro Abs 04/17/2013 1.9  1.7 - 7.7 K/uL Final  . Lymphocytes Relative 04/17/2013 34  12 - 46 % Final  . Lymphs Abs 04/17/2013 1.2  0.7 - 4.0 K/uL Final  . Monocytes Relative 04/17/2013 10  3 - 12 % Final  . Monocytes Absolute 04/17/2013 0.4  0.1 - 1.0 K/uL Final  . Eosinophils Relative 04/17/2013 2  0 - 5 % Final  . Eosinophils Absolute 04/17/2013 0.1  0.0 - 0.7 K/uL Final  . Basophils Relative 04/17/2013 1  0 - 1 % Final  . Basophils Absolute 04/17/2013 0.0  0.0 - 0.1 K/uL Final  . Sodium 04/17/2013 144  137 - 147 mEq/L Final  . Potassium 04/17/2013 3.1* 3.7 - 5.3 mEq/L Final  . Chloride 04/17/2013 100  96 - 112  mEq/L Final  . CO2 04/17/2013 31  19 - 32 mEq/L Final  . Glucose, Bld 04/17/2013 95  70 - 99 mg/dL Final  . BUN 04/17/2013 9  6 - 23 mg/dL Final  . Creatinine, Ser 04/17/2013 1.28  0.50 - 1.35 mg/dL Final  . Calcium 04/17/2013 9.0  8.4 - 10.5 mg/dL Final  . Total Protein 04/17/2013 7.3  6.0 - 8.3 g/dL Final  . Albumin 04/17/2013 3.9  3.5 - 5.2 g/dL Final  . AST 04/17/2013 137* 0 - 37 U/L Final  . ALT 04/17/2013 53  0 - 53 U/L Final  . Alkaline Phosphatase 04/17/2013 84  39 - 117 U/L Final  . Total Bilirubin 04/17/2013 1.7* 0.3 - 1.2 mg/dL Final  . GFR calc non Af Amer 04/17/2013  59* >90 mL/min Final  . GFR calc Af Amer 04/17/2013 69* >90 mL/min Final   Comment: (NOTE)                          The eGFR has been calculated using the CKD EPI equation.                          This calculation has not been validated in all clinical situations.                          eGFR's persistently <90 mL/min signify possible Chronic Kidney                          Disease.  . CEA 04/17/2013 3.4  0.0 - 5.0 ng/mL Final   Performed at [facility]: No new pathology. Peripheral smear failed to reveal evidence of platelet clumping.  Urinalysis    Component Value Date/Time   COLORURINE YELLOW 03/12/2010 1240   APPEARANCEUR CLEAR 03/12/2010 1240   LABSPEC 1.010 03/12/2010 1240   PHURINE 5.5 03/12/2010 1240   GLUCOSEU NEGATIVE 01/29/2010 1328   HGBUR TRACE* 03/12/2010 1240   BILIRUBINUR NEGATIVE 03/12/2010 1240   KETONESUR NEGATIVE 03/12/2010 1240   PROTEINUR NEGATIVE 03/12/2010 1240   UROBILINOGEN 0.2 03/12/2010 1240   NITRITE NEGATIVE 03/12/2010 1240   LEUKOCYTESUR NEGATIVE 03/12/2010 1240    RADIOGRAPHIC STUDIES:  EGD on 08/06/2012 showed Schatzki's ring, short segment Barrett's esophagus, and esophageal varices with erosive esophagitis. Colonoscopy on 04/02/2012 was normal.  ASSESSMENT:  #1. Stage I colon cancer, status post colon resection, no evidence of disease. #2. 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   PLAN:  #1. Watchful expectation. #2. Mylanta for breakthrough abdominal discomfort. #3. Continue current medications.  #4. Followup in 6 months with CBC, chem profile, and CEA.   All questions were answered. The patient knows to call the clinic with any problems, questions or concerns. We can certainly see the patient much sooner if necessary.   I spent 25 minutes counseling the patient face to face. The total time spent in the appointment was 30 minutes.    Doroteo Bradford, MD 04/20/2013 8:25 AM

## 2013-04-23 ENCOUNTER — Encounter: Payer: Self-pay | Admitting: Gastroenterology

## 2013-04-23 ENCOUNTER — Ambulatory Visit (INDEPENDENT_AMBULATORY_CARE_PROVIDER_SITE_OTHER): Payer: Medicaid Other | Admitting: Gastroenterology

## 2013-04-23 VITALS — BP 127/83 | HR 70 | Temp 98.4°F | Wt 160.2 lb

## 2013-04-23 DIAGNOSIS — K219 Gastro-esophageal reflux disease without esophagitis: Secondary | ICD-10-CM

## 2013-04-23 DIAGNOSIS — C189 Malignant neoplasm of colon, unspecified: Secondary | ICD-10-CM

## 2013-04-23 DIAGNOSIS — I8500 Esophageal varices without bleeding: Secondary | ICD-10-CM

## 2013-04-23 DIAGNOSIS — K7030 Alcoholic cirrhosis of liver without ascites: Secondary | ICD-10-CM

## 2013-04-23 MED ORDER — DEXLANSOPRAZOLE 60 MG PO CPDR: 60.0000 mg | DELAYED_RELEASE_CAPSULE | Freq: Every day | ORAL | Status: DC

## 2013-04-23 NOTE — Patient Instructions (Signed)
Please reduce the Nadolol to 1 tablet daily. Please call me if you are unable to tolerate it.   Take Zofran as needed for nausea. Limit Reglan to only if absolutely necessary.   I have changed you from Nexium to [place]. Stop Nexium. Start Dexilant once each morning. I have provided a voucher and refills.   We will see you back in 3 months! Please get the vaccinations completed as soon as you can! : )

## 2013-04-23 NOTE — Assessment & Plan Note (Signed)
Surveillance due Feb 2017.

## 2013-04-23 NOTE — Progress Notes (Signed)
Referring Provider: Lanette Hampshire, MD Primary Care Physician:  Lanette Hampshire, MD Primary GI: Dr. Gala Romney   Chief Complaint  Patient presents with  . Follow-up    HPI:   ARSEN MANGIONE presents today with a history of colon cancer, due for surveillance Feb 2017. ETOH cirrhosis, with last [place] of abdomen in Feb 2015  without evidence of [place]. Due in Aug  2015. Up-to-date on EGD for varices. Started on Nadolol 40 mg daily in Dec 2014. Hep A and B vaccination: hasn't completed.   Recently found to have gastroparesis. Started on low-dose Reglan BID. Sometimes skips Nexium then has reflux. Takes it before going to bed, which works for him. Feels like Nexium is causing him to be swimmy-headed. Felt weak with Nadolol 40 mg. Previously failed Inderal.   Drinking glass of borboun daily.   Past Medical History  Diagnosis Date  . Gout   . GERD (gastroesophageal reflux disease)   . Cirrhosis     ?ETOH related, afp 02/05/12= 4.9, ct on 10/16/11,no  Hep A/B vaccines  . Colon cancer Dec 2011    Cecum  . ARF (acute renal failure)     hospitalization Jan 2012  . H/O ETOH abuse   . 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 S/P endoscopy Dec 2011    Schatzki's ring, Grade 1 esophageal varices, antral/body erosions  . Anemia of chronic disease 08/10/2010  . Renal insufficiency 10/24/2010  . Schatzki's ring   . Varices, esophageal   . Gastroparesis 01/2013    Past Surgical History  Procedure Laterality Date  . Colon surgery  02/24/2010    colon cancer (cecum)  . Exploratory laparotomy  03/03/2010    anastomotic leak, developed EC fistula  . Exploratory laparotomy w/ bowel resection  04/18/2010    ileostomy placed, (hx of EC fistula, anastomotic leak). About two feet of ileum removed.  . Colostomy    . Percutaneous drainage of intraabdominal abscess  07/2010 and 09/2010  . Partial colectomy  10/30/2010    Procedure: PARTIAL COLECTOMY;  Surgeon: Jamesetta So;  Location: [facility];  Service: General;  Laterality: N/A;  . Ileostomy closure  10/30/2010    Procedure: ILEOSTOMY TAKEDOWN;  Surgeon: Jamesetta So;  Location: [facility];  Service: General;  Laterality: N/A;  . Colonoscopy  Dec 2011    cecal mass, tubulovillous adenoma at splenic flexure  . Esophagogastroduodenoscopy  Dec 2011    Schatzki's ring, Grade 1 esophageal varices, antral/body erosions  . Colonoscopy N/A 04/02/2012    MPN:TIRWER residual rectal and colonic mucosa. Next colonoscopy in 03/2015.  Marland Kitchen Esophagogastroduodenoscopy N/A 08/06/2012    RMR: Grade 1 esophageal varices. Abnormal distal esophageal mucosa-3 short segment Barrett's-like appearance but negative biopsy for barrett's. mild erosive reflux esophagitis, non-critical Schatzki's ring, hiatal hernia, portal gastropathy.   . Esophageal banding N/A 08/06/2012    no banding   . Wound exploration N/A 02/25/2013    Procedure: EXCISION OF SUTURE GRANULOMA ABDOMINAL WALL;  Surgeon: Jamesetta So, MD;  Location: [facility];  Service: General;  Laterality: N/A;    Current Outpatient Prescriptions  Medication Sig Dispense Refill  . diphenoxylate-atropine (LOMOTIL) 2.5-0.025 MG per tablet Take 1 tablet by mouth 3 (three) times daily as needed for diarrhea or loose stools.      Marland Kitchen esomeprazole (NEXIUM) 40 MG capsule Take 1 capsule (40 mg total) by mouth 2 (two) times daily before a meal.  60 capsule  5  . HYDROcodone-acetaminophen (NORCO) 5-325 MG per tablet Take 1 tablet by mouth every 4 (four) hours as needed for moderate pain.  40 tablet  0  . metoCLOPramide (REGLAN) 10 MG tablet Take 1 tablet (10 mg total) by mouth 2 (two) times daily. With breakfast and dinner.  60 tablet  1  . MILK THISTLE PO Take 240 mg by mouth daily.      . nadolol (CORGARD) 20 MG tablet Take 2 tablets (40 mg total) by mouth daily.  60 tablet  3  . ondansetron (ZOFRAN) 4 MG tablet Take 1 tablet (4 mg total) by mouth every 8 (eight) hours as needed for nausea or  vomiting.  30 tablet  1   No current facility-administered medications for this visit.    Allergies as of 04/23/2013 - Review Complete 04/23/2013  Allergen Reaction Noted  . Ace inhibitors  02/25/2013  . Lorazepam Other (See Comments) 08/01/2010  . Percocet [oxycodone-acetaminophen] Itching and Nausea Only 08/01/2010    Family History  Problem Relation Age of Onset  . Cirrhosis Father     deceased, secondary to ETOH    History   Social History  . Marital Status: Married    Spouse Name: N/A    Number of Children: N/A  . Years of Education: N/A   Social History Main Topics  . Smoking status: Never Smoker   . Smokeless tobacco: Current User    Types: Chew     Comment: Never smoked/ chews tobacco some  . Alcohol Use: Yes     Comment: continues to drink frequent but patient will not quantify  . Drug Use: No  . Sexual Activity: Yes    Birth Control/ Protection: None   Other Topics Concern  . None   Social History Narrative  . None    Review of Systems: As mentioned in HPI.   Physical Exam: BP 127/83  Pulse 102  Temp(Src) 98.4 F (36.9 C) (Oral)  Wt 160 lb 3.2 oz (72.666 kg) General:   Alert and oriented. No distress noted. Pleasant and cooperative.  Heart:  S1, S2 present without murmurs, rubs, or gallops. Regular rate and rhythm. Abdomen:  +BS, soft, non-tender and non-distended. Liver margin palpable 2 fingerbreadths below right subcostal margin Extremities:  Without edema. Neurologic:  Alert and  oriented x4;  grossly normal neurologically. Skin:  Intact without significant lesions or rashes. Psych:  Alert and cooperative. Normal mood and affect.  Lab Results  Component Value Date   WBC 3.6* 04/17/2013   HGB 12.7* 04/17/2013   HCT 37.5* 04/17/2013   MCV 100.5* 04/17/2013   PLT 105* 04/17/2013   Lab Results  Component Value Date   ALT 53 04/17/2013   AST 137* 04/17/2013   ALKPHOS 84 04/17/2013   BILITOT 1.7* 04/17/2013   Lab Results  Component Value  Date   CREATININE 1.28 04/17/2013   BUN 9 04/17/2013   NA 144 04/17/2013   K 3.1* 04/17/2013   CL 100 04/17/2013   CO2 31 04/17/2013

## 2013-04-23 NOTE — Assessment & Plan Note (Signed)
Stop Nexium due to ?side effect. Start Dexilant daily.

## 2013-04-23 NOTE — Assessment & Plan Note (Signed)
Up-to-date on variceal screening. Unable to tolerate Nadolol 40 mg. Decrease to 20 mg.  Originally pulse was 102 on physical exam; rechecked and found to be in 60s/70s. I have asked him to monitor this and call if unable to tolerate Nadolol dosage adjustment.

## 2013-04-23 NOTE — Assessment & Plan Note (Signed)
Well-compensated. [place] of abdomen up-to-date and due in Aug 2015. STILL NEEDS HEP A AND B VACCINATION. Discussed importance of this. [facility] Return in 3 months.

## 2013-04-27 NOTE — Progress Notes (Signed)
cc'd to pcp 

## 2013-05-18 ENCOUNTER — Other Ambulatory Visit (HOSPITAL_COMMUNITY): Payer: Self-pay | Admitting: Family Medicine

## 2013-05-18 DIAGNOSIS — R634 Abnormal weight loss: Secondary | ICD-10-CM

## 2013-05-19 ENCOUNTER — Ambulatory Visit (HOSPITAL_COMMUNITY)
Admission: RE | Admit: 2013-05-19 | Discharge: 2013-05-19 | Disposition: A | Discharge status: Home / Self Care | Payer: Medicaid Other | Source: Ambulatory Visit | Attending: Family Medicine | Admitting: Family Medicine

## 2013-05-19 DIAGNOSIS — R634 Abnormal weight loss: Secondary | ICD-10-CM

## 2013-05-21 ENCOUNTER — Ambulatory Visit (HOSPITAL_COMMUNITY)
Admission: RE | Admit: 2013-05-21 | Discharge: 2013-05-21 | Disposition: A | Discharge status: Home / Self Care | Payer: Medicaid Other | Source: Ambulatory Visit | Attending: Family Medicine | Admitting: Family Medicine

## 2013-05-21 DIAGNOSIS — R109 Unspecified abdominal pain: Secondary | ICD-10-CM | POA: Insufficient documentation

## 2013-05-21 DIAGNOSIS — K7689 Other specified diseases of liver: Secondary | ICD-10-CM | POA: Insufficient documentation

## 2013-05-21 DIAGNOSIS — R188 Other ascites: Secondary | ICD-10-CM | POA: Insufficient documentation

## 2013-05-21 DIAGNOSIS — R634 Abnormal weight loss: Secondary | ICD-10-CM | POA: Insufficient documentation

## 2013-05-21 DIAGNOSIS — K7460 Unspecified cirrhosis of liver: Secondary | ICD-10-CM | POA: Insufficient documentation

## 2013-05-21 DIAGNOSIS — Z85038 Personal history of other malignant neoplasm of large intestine: Secondary | ICD-10-CM | POA: Insufficient documentation

## 2013-05-21 MED ORDER — IOHEXOL 300 MG/ML  SOLN
100.0000 mL | Freq: Once | INTRAMUSCULAR | Status: AC | PRN
  Administered 2013-05-21: 100 mL via INTRAVENOUS

## 2013-07-10 ENCOUNTER — Ambulatory Visit (INDEPENDENT_AMBULATORY_CARE_PROVIDER_SITE_OTHER): Payer: Medicaid Other | Admitting: Urology

## 2013-07-10 ENCOUNTER — Other Ambulatory Visit: Payer: Self-pay | Admitting: Urology

## 2013-07-10 DIAGNOSIS — R972 Elevated prostate specific antigen [PSA]: Secondary | ICD-10-CM

## 2013-07-10 DIAGNOSIS — N401 Enlarged prostate with lower urinary tract symptoms: Secondary | ICD-10-CM

## 2013-07-24 ENCOUNTER — Other Ambulatory Visit (HOSPITAL_COMMUNITY): Payer: Medicaid Other

## 2013-08-02 ENCOUNTER — Encounter: Payer: Self-pay | Admitting: Internal Medicine

## 2013-08-14 ENCOUNTER — Encounter (HOSPITAL_COMMUNITY): Payer: Self-pay

## 2013-08-14 ENCOUNTER — Ambulatory Visit (HOSPITAL_COMMUNITY)
Admission: RE | Admit: 2013-08-14 | Discharge: 2013-08-14 | Disposition: A | Discharge status: Home / Self Care | Payer: Medicaid Other | Source: Ambulatory Visit | Attending: Urology | Admitting: Urology

## 2013-08-14 ENCOUNTER — Other Ambulatory Visit: Payer: Self-pay | Admitting: Urology

## 2013-08-14 VITALS — BP 120/77 | HR 87 | Temp 97.2°F | Resp 16

## 2013-08-14 DIAGNOSIS — R972 Elevated prostate specific antigen [PSA]: Secondary | ICD-10-CM

## 2013-08-14 HISTORY — PX: PROSTATE BIOPSY: SHX241

## 2013-08-14 MED ORDER — CEFTRIAXONE SODIUM 1 G IJ SOLR
1.0000 g | Freq: Once | INTRAMUSCULAR | Status: AC
  Administered 2013-08-14: 1000 mg via INTRAMUSCULAR
  Filled 2013-08-14: qty 10

## 2013-08-14 MED ORDER — CEFTRIAXONE SODIUM 1 G IJ SOLR
1.0000 g | INTRAMUSCULAR | Status: DC
  Filled 2013-08-14: qty 10

## 2013-08-14 MED ORDER — LIDOCAINE HCL (PF) 2 % IJ SOLN
10.0000 mL | Freq: Once | INTRAMUSCULAR | Status: AC
  Administered 2013-08-14: 10 mL

## 2013-08-14 MED ORDER — LIDOCAINE HCL (PF) 2 % IJ SOLN
INTRAMUSCULAR | Status: AC
  Administered 2013-08-14: 12:00:00 10 mL
  Filled 2013-08-14: qty 10

## 2013-08-14 MED ORDER — CEFTRIAXONE SODIUM 1 G IJ SOLR
INTRAMUSCULAR | Status: AC
  Administered 2013-08-14: 12:00:00 1000 mg via INTRAMUSCULAR
  Filled 2013-08-14: qty 10

## 2013-08-14 MED ORDER — LIDOCAINE HCL (PF) 1 % IJ SOLN
5.0000 mL | Freq: Once | INTRAMUSCULAR | Status: AC
  Administered 2013-08-14: 5 mL

## 2013-08-14 MED ORDER — LIDOCAINE HCL (PF) 1 % IJ SOLN
INTRAMUSCULAR | Status: AC
  Administered 2013-08-14: 5 mL
  Filled 2013-08-14: qty 5

## 2013-08-14 NOTE — Sedation Documentation (Signed)
Biopsy complete no signs of distress  

## 2013-08-14 NOTE — Procedures (Signed)
Mr. Mundt was given Levaquin po preprocedure and 1 gm of rocephin today.  He was placed in the left lateral decubitus position and the US probe was assembled and inserted.   The prostate volume is 64ml with a width of 4.6 cm, a length of 4.6 cm and a height of 3.3 cm.   There was a hypoechoic lesion in the left lateral prostate in the base and mid prostate with distortion of the capsule in the mid portion with possible ECE.   The TZ was small and unremarkable except for a few calcifications.       After the diagnostic scan a prostate block was done with 10cc of 2% lidocaine and the US guidance was used to obtain 12 cores in the standard configuration.  There were no complications.

## 2013-08-14 NOTE — Discharge Instructions (Signed)
Transrectal Ultrasound-Guided Biopsy °A transrectal ultrasound-guided biopsy is a procedure to take samples of tissue from your prostate. Ultrasound images are used to guide the procedure. It is usually done to check the prostate gland for cancer. °BEFORE THE PROCEDURE °· Do not eat or drink after midnight on the night before your procedure. °· Take medicines as your doctor tells you. °· Your doctor may have you stop taking some medicines 5-7 days before the procedure. °· You will be given an enema before your procedure. During an enema, a liquid is put into your butt (rectum) to clear out waste. °· You may have lab tests the day of your procedure. °· Make plans to have someone drive you home. °PROCEDURE °· You will be given medicine to help you relax before the procedure. An IV tube will be put into one of your veins. It will be used to give fluids and medicine. °· You will be given medicine to reduce the risk of infection (antibiotic). °· You will be placed on your side. °· A probe with gel will be put in your butt. This is used to take pictures of your prostate and the area around it. °· A medicine to numb the area is put into your prostate. °· A biopsy needle is then inserted and guided to your prostate. °· Samples of prostate tissue are taken. The needle is removed. °· The samples are sent to a lab to be checked. Results are usually back in 2-3 days. °AFTER THE PROCEDURE °· You will be taken to a room where you will be watched until you are doing okay. °· You may have some pain in the area around your butt. You will be given medicines for this. °· You may be able to go home the same day. Sometimes, an overnight stay in the hospital is needed. °Document Released: 01/24/2009 Document Revised: 02/10/2013 Document Reviewed: 09/24/2012 °ExitCare® Patient Information ©2015 [employer]. This information is not intended to replace advice given to you by your health care provider. Make sure you discuss any questions  you have with your health care provider. ° °

## 2013-08-28 ENCOUNTER — Ambulatory Visit: Payer: Medicaid Other | Admitting: Urology

## 2013-09-03 ENCOUNTER — Encounter: Payer: Self-pay | Admitting: Urology

## 2013-09-16 ENCOUNTER — Other Ambulatory Visit (HOSPITAL_COMMUNITY): Payer: Self-pay

## 2013-09-16 ENCOUNTER — Telehealth: Payer: Self-pay | Admitting: Internal Medicine

## 2013-09-16 ENCOUNTER — Encounter: Payer: Self-pay | Admitting: Internal Medicine

## 2013-09-16 DIAGNOSIS — C189 Malignant neoplasm of colon, unspecified: Secondary | ICD-10-CM

## 2013-09-16 DIAGNOSIS — D638 Anemia in other chronic diseases classified elsewhere: Secondary | ICD-10-CM

## 2013-09-16 NOTE — Telephone Encounter (Signed)
Letter has been mailed to patient

## 2013-09-16 NOTE — Telephone Encounter (Signed)
Pt is on July recall to have a repeat abd U/S in 6 months/cirrhosis

## 2013-09-28 ENCOUNTER — Other Ambulatory Visit: Payer: Self-pay

## 2013-09-28 DIAGNOSIS — K7460 Unspecified cirrhosis of liver: Secondary | ICD-10-CM

## 2013-09-28 NOTE — Telephone Encounter (Signed)
Pt is schedule for [place] on Friday August 14 at 7:45. Pt is aware of the appointment and not to eat or drink anything after midnight.

## 2013-10-02 ENCOUNTER — Encounter (HOSPITAL_COMMUNITY): Payer: Medicaid Other | Attending: Hematology and Oncology

## 2013-10-02 ENCOUNTER — Ambulatory Visit (HOSPITAL_COMMUNITY)
Admission: RE | Admit: 2013-10-02 | Discharge: 2013-10-02 | Disposition: A | Discharge status: Home / Self Care | Payer: Medicaid Other | Source: Ambulatory Visit | Attending: Internal Medicine | Admitting: Internal Medicine

## 2013-10-02 DIAGNOSIS — R197 Diarrhea, unspecified: Secondary | ICD-10-CM | POA: Diagnosis not present

## 2013-10-02 DIAGNOSIS — Z885 Allergy status to narcotic agent status: Secondary | ICD-10-CM | POA: Diagnosis not present

## 2013-10-02 DIAGNOSIS — C180 Malignant neoplasm of cecum: Secondary | ICD-10-CM | POA: Diagnosis not present

## 2013-10-02 DIAGNOSIS — K8020 Calculus of gallbladder without cholecystitis without obstruction: Secondary | ICD-10-CM | POA: Diagnosis not present

## 2013-10-02 DIAGNOSIS — R972 Elevated prostate specific antigen [PSA]: Secondary | ICD-10-CM | POA: Diagnosis not present

## 2013-10-02 DIAGNOSIS — F17200 Nicotine dependence, unspecified, uncomplicated: Secondary | ICD-10-CM | POA: Insufficient documentation

## 2013-10-02 DIAGNOSIS — Z888 Allergy status to other drugs, medicaments and biological substances status: Secondary | ICD-10-CM | POA: Diagnosis not present

## 2013-10-02 DIAGNOSIS — K222 Esophageal obstruction: Secondary | ICD-10-CM | POA: Insufficient documentation

## 2013-10-02 DIAGNOSIS — D638 Anemia in other chronic diseases classified elsewhere: Secondary | ICD-10-CM | POA: Diagnosis not present

## 2013-10-02 DIAGNOSIS — D61818 Other pancytopenia: Secondary | ICD-10-CM | POA: Diagnosis not present

## 2013-10-02 DIAGNOSIS — K219 Gastro-esophageal reflux disease without esophagitis: Secondary | ICD-10-CM | POA: Diagnosis not present

## 2013-10-02 DIAGNOSIS — C61 Malignant neoplasm of prostate: Secondary | ICD-10-CM | POA: Diagnosis present

## 2013-10-02 DIAGNOSIS — K7689 Other specified diseases of liver: Secondary | ICD-10-CM | POA: Insufficient documentation

## 2013-10-02 DIAGNOSIS — R634 Abnormal weight loss: Secondary | ICD-10-CM | POA: Diagnosis not present

## 2013-10-02 DIAGNOSIS — D731 Hypersplenism: Secondary | ICD-10-CM | POA: Insufficient documentation

## 2013-10-02 DIAGNOSIS — F1020 Alcohol dependence, uncomplicated: Secondary | ICD-10-CM | POA: Insufficient documentation

## 2013-10-02 DIAGNOSIS — M109 Gout, unspecified: Secondary | ICD-10-CM | POA: Insufficient documentation

## 2013-10-02 DIAGNOSIS — K7030 Alcoholic cirrhosis of liver without ascites: Secondary | ICD-10-CM | POA: Diagnosis present

## 2013-10-02 DIAGNOSIS — Z9889 Other specified postprocedural states: Secondary | ICD-10-CM | POA: Insufficient documentation

## 2013-10-02 DIAGNOSIS — I8500 Esophageal varices without bleeding: Secondary | ICD-10-CM | POA: Diagnosis not present

## 2013-10-02 DIAGNOSIS — K3184 Gastroparesis: Secondary | ICD-10-CM | POA: Insufficient documentation

## 2013-10-02 DIAGNOSIS — D696 Thrombocytopenia, unspecified: Secondary | ICD-10-CM | POA: Diagnosis not present

## 2013-10-02 DIAGNOSIS — E860 Dehydration: Secondary | ICD-10-CM | POA: Diagnosis not present

## 2013-10-02 DIAGNOSIS — T8189XA Other complications of procedures, not elsewhere classified, initial encounter: Secondary | ICD-10-CM | POA: Diagnosis not present

## 2013-10-02 DIAGNOSIS — K7460 Unspecified cirrhosis of liver: Secondary | ICD-10-CM | POA: Insufficient documentation

## 2013-10-02 DIAGNOSIS — R161 Splenomegaly, not elsewhere classified: Secondary | ICD-10-CM | POA: Insufficient documentation

## 2013-10-02 DIAGNOSIS — C189 Malignant neoplasm of colon, unspecified: Secondary | ICD-10-CM

## 2013-10-02 DIAGNOSIS — N189 Chronic kidney disease, unspecified: Secondary | ICD-10-CM | POA: Insufficient documentation

## 2013-10-02 LAB — CBC WITH DIFFERENTIAL/PLATELET
BASOS ABS: 0 10*3/uL (ref 0.0–0.1)
BASOS PCT: 0 % (ref 0–1)
Eosinophils Absolute: 0.1 10*3/uL (ref 0.0–0.7)
Eosinophils Relative: 1 % (ref 0–5)
HCT: 34.3 % — ABNORMAL LOW (ref 39.0–52.0)
Hemoglobin: 11.7 g/dL — ABNORMAL LOW (ref 13.0–17.0)
Lymphocytes Relative: 32 % (ref 12–46)
Lymphs Abs: 1.2 10*3/uL (ref 0.7–4.0)
MCH: 35.9 pg — ABNORMAL HIGH (ref 26.0–34.0)
MCHC: 34.1 g/dL (ref 30.0–36.0)
MCV: 105.2 fL — ABNORMAL HIGH (ref 78.0–100.0)
Monocytes Absolute: 0.3 10*3/uL (ref 0.1–1.0)
Monocytes Relative: 7 % (ref 3–12)
NEUTROS PCT: 59 % (ref 43–77)
Neutro Abs: 2.2 10*3/uL (ref 1.7–7.7)
Platelets: 75 10*3/uL — ABNORMAL LOW (ref 150–400)
RBC: 3.26 MIL/uL — ABNORMAL LOW (ref 4.22–5.81)
RDW: 14.2 % (ref 11.5–15.5)
WBC: 3.7 10*3/uL — ABNORMAL LOW (ref 4.0–10.5)

## 2013-10-02 LAB — COMPREHENSIVE METABOLIC PANEL
ALBUMIN: 3.4 g/dL — AB (ref 3.5–5.2)
ALT: 23 U/L (ref 0–53)
AST: 59 U/L — AB (ref 0–37)
Alkaline Phosphatase: 120 U/L — ABNORMAL HIGH (ref 39–117)
Anion gap: 15 (ref 5–15)
BILIRUBIN TOTAL: 2.1 mg/dL — AB (ref 0.3–1.2)
BUN: 8 mg/dL (ref 6–23)
CO2: 27 mEq/L (ref 19–32)
CREATININE: 0.93 mg/dL (ref 0.50–1.35)
Calcium: 8.9 mg/dL (ref 8.4–10.5)
Chloride: 100 mEq/L (ref 96–112)
GFR calc Af Amer: >90 mL/min (ref >90)
GFR, EST NON AFRICAN AMERICAN: 89 mL/min — AB (ref >90)
Glucose, Bld: 86 mg/dL (ref 70–99)
Potassium: 3.1 mEq/L — ABNORMAL LOW (ref 3.7–5.3)
Sodium: 142 mEq/L (ref 137–147)
Total Protein: 6.6 g/dL (ref 6.0–8.3)

## 2013-10-02 LAB — CEA: CEA: 4.2 ng/mL (ref 0.0–5.0)

## 2013-10-05 ENCOUNTER — Encounter (HOSPITAL_BASED_OUTPATIENT_CLINIC_OR_DEPARTMENT_OTHER): Payer: Medicaid Other

## 2013-10-05 ENCOUNTER — Encounter (HOSPITAL_COMMUNITY): Payer: Self-pay

## 2013-10-05 VITALS — BP 114/74 | HR 91 | Temp 97.5°F | Resp 16 | Wt 149.0 lb

## 2013-10-05 DIAGNOSIS — IMO0002 Reserved for concepts with insufficient information to code with codable children: Secondary | ICD-10-CM

## 2013-10-05 DIAGNOSIS — K7030 Alcoholic cirrhosis of liver without ascites: Secondary | ICD-10-CM | POA: Diagnosis not present

## 2013-10-05 DIAGNOSIS — K8020 Calculus of gallbladder without cholecystitis without obstruction: Secondary | ICD-10-CM

## 2013-10-05 DIAGNOSIS — S31109S Unspecified open wound of abdominal wall, unspecified quadrant without penetration into peritoneal cavity, sequela: Secondary | ICD-10-CM

## 2013-10-05 DIAGNOSIS — C189 Malignant neoplasm of colon, unspecified: Secondary | ICD-10-CM

## 2013-10-05 DIAGNOSIS — C61 Malignant neoplasm of prostate: Secondary | ICD-10-CM

## 2013-10-05 DIAGNOSIS — D731 Hypersplenism: Secondary | ICD-10-CM

## 2013-10-05 DIAGNOSIS — K7460 Unspecified cirrhosis of liver: Secondary | ICD-10-CM

## 2013-10-05 MED ORDER — PANCRELIPASE (LIP-PROT-AMYL) 24000-76000 UNITS PO CPEP: 24000.0000 [IU] | ORAL_CAPSULE | Freq: Three times a day (TID) | ORAL | Status: AC

## 2013-10-05 NOTE — Patient Instructions (Signed)
[facility] Discharge Instructions  RECOMMENDATIONS MADE BY THE CONSULTANT AND ANY TEST RESULTS WILL BE SENT TO YOUR REFERRING PHYSICIAN.  EXAM FINDINGS BY THE PHYSICIAN TODAY AND SIGNS OR SYMPTOMS TO REPORT TO CLINIC OR PRIMARY PHYSICIAN: Exam and findings as discussed by Dr. Barnet Glasgow. Concerned about your weight loss and will do CT scans of your chest, abdomen and pelvis and refer you back to Dr. Arnoldo Morale to look at your abdominal incision. Will see you back in 3 weeks to discuss results.  MEDICATIONS PRESCRIBED:  Creon take as directed  INSTRUCTIONS/FOLLOW-UP: CT scans of Chest, Abdomen and pelvis Referral back to Dr. Arnoldo Morale  Follow-up in 3 weeks.  Thank you for choosing [facility] to provide your oncology and hematology care.  To afford each patient quality time with our providers, please arrive at least 15 minutes before your scheduled appointment time.  With your help, our goal is to use those 15 minutes to complete the necessary work-up to ensure our physicians have the information they need to help with your evaluation and healthcare recommendations.    Effective January 1st, 2014, we ask that you re-schedule your appointment with our physicians should you arrive 10 or more minutes late for your appointment.  We strive to give you quality time with our providers, and arriving late affects you and other patients whose appointments are after yours.    Again, thank you for choosing [facility].  Our hope is that these requests will decrease the amount of time that you wait before being seen by our physicians.       _____________________________________________________________  Should you have questions after your visit to [facility], please contact our office at [phone] between the hours of 8:30 a.m. and 4:30 p.m.  Voicemails left after 4:30 p.m. will not be returned until the following business day.  For prescription  refill requests, have your pharmacy contact our office with your prescription refill request.    _______________________________________________________________  We hope that we have given you very good care.  You may receive a patient satisfaction survey in the mail, please complete it and return it as soon as possible.  We value your feedback!  _______________________________________________________________  Have you asked about our STAR program?  STAR stands for Survivorship Training and Rehabilitation, and this is a nationally recognized cancer care program that focuses on survivorship and rehabilitation.  Cancer and cancer treatments may cause problems, such as, pain, making you feel tired and keeping you from doing the things that you need or want to do. Cancer rehabilitation can help. Our goal is to reduce these troubling effects and help you have the best quality of life possible.  You may receive a survey from a nurse that asks questions about your current state of health.  Based on the survey results, all eligible patients will be referred to the [facility] program for an evaluation so we can better serve you!  A frequently asked questions sheet is available upon request.

## 2013-10-05 NOTE — Progress Notes (Signed)
[facility]  OFFICE PROGRESS NOTE  Lanette Hampshire, MD [address] [place] [address]  DIAGNOSIS: Alcoholic cirrhosis of liver without ascites - Plan: Soluble transferrin receptor  Colon cancer - Plan: CT Abdomen Pelvis W Contrast, CT Chest W Contrast, Consult to general surgery  Prostate cancer - Plan: PSA, PSA  Hypersplenism syndrome  Cirrhosis of liver without mention of alcohol  Calculus of gallbladder without cholecystitis without obstruction  Wound, open, abdominal wall, anterior, sequela  Chief Complaint  Patient presents with  . Colon Cancer    Follow-up  . Prostate Cancer  . Hypersplenism    CURRENT THERAPY: Watchful expectation  INTERVAL HISTORY: Peter [name] 63 y.o. male returns for followup of stage I colon cancer in the setting of cirrhosis of the liver with hypersplenism and mild thrombocytopenia. 08/14/2013 the patient underwent prostate ultrasound with multiple biopsies 2 of 12 showing evidence of adenocarcinoma, Gleason score 3+3=6  involving less than 5% of each core. Despite eating what he normally does, he has lost 12 pounds since his last visit. He denies any melena, or anesthesia, but does have diarrhea 3-4 times per day, not watery, and not bloody. He denies any abdominal distention, nausea, vomiting, lower extremity swelling or redness, chest pain, PND, orthopnea, palpitations, skin rash, joint pain, headache, or seizures.    MEDICAL HISTORY: Past Medical History  Diagnosis Date  . Gout   . GERD (gastroesophageal reflux disease)   . Cirrhosis     ?ETOH related, afp 02/05/12= 4.9, ct on 10/16/11,no  Hep A/B vaccines  . Colon cancer Dec 2011    Cecum  . ARF (acute renal failure)     hospitalization Jan 2012  . H/O ETOH abuse   . 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 S/P endoscopy Dec 2011    Schatzki's ring, Grade 1 esophageal varices,  antral/body erosions  . Anemia of chronic disease 08/10/2010  . Renal insufficiency 10/24/2010  . Schatzki's ring   . Varices, esophageal   . Gastroparesis 01/2013    INTERIM HISTORY: has Alcoholic cirrhosis of liver; EPIGASTRIC PAIN; TRANSAMINASES, SERUM, ELEVATED; Personal history of colon cancer; Dehydration; Colon cancer; Anemia of chronic disease; Diarrhea; Weight loss, abnormal; Renal insufficiency; Retroperitoneal lymphadenopathy; Esophageal varices without bleeding; Chronic diarrhea; GERD (gastroesophageal reflux disease); Nausea alone; Hypersplenism syndrome; Thrombocytopenia due to hypersplenism; and Elevated PSA on his problem list.    ALLERGIES:  is allergic to ace inhibitors; lorazepam; and percocet.  MEDICATIONS: has a current medication list which includes the following prescription(s): dexlansoprazole, diphenoxylate-atropine, esomeprazole, hydrocodone-acetaminophen, milk thistle, nadolol, ondansetron, metoclopramide, and pancrelipase (lip-prot-amyl).  SURGICAL HISTORY:  Past Surgical History  Procedure Laterality Date  . Colon surgery  02/24/2010    colon cancer (cecum)  . Exploratory laparotomy  03/03/2010    anastomotic leak, developed EC fistula  . Exploratory laparotomy w/ bowel resection  04/18/2010    ileostomy placed, (hx of EC fistula, anastomotic leak). About two feet of ileum removed.  . Colostomy    . Percutaneous drainage of intraabdominal abscess  07/2010 and 09/2010  . Partial colectomy  10/30/2010    Procedure: PARTIAL COLECTOMY;  Surgeon: Jamesetta So;  Location: [facility];  Service: General;  Laterality: N/A;  . Ileostomy closure  10/30/2010    Procedure: ILEOSTOMY TAKEDOWN;  Surgeon: Jamesetta So;  Location: [facility];  Service: General;  Laterality: N/A;  . Colonoscopy  Dec  2011    cecal mass, tubulovillous adenoma at splenic flexure  . Esophagogastroduodenoscopy  Dec 2011    Schatzki's ring, Grade 1 esophageal varices, antral/body erosions  . Colonoscopy N/A  04/02/2012    GYJ:EHUDJS residual rectal and colonic mucosa. Next colonoscopy in 03/2015.  Marland Kitchen Esophagogastroduodenoscopy N/A 08/06/2012    RMR: Grade 1 esophageal varices. Abnormal distal esophageal mucosa-3 short segment Barrett's-like appearance but negative biopsy for barrett's. mild erosive reflux esophagitis, non-critical Schatzki's ring, hiatal hernia, portal gastropathy.   . Esophageal banding N/A 08/06/2012    no banding   . Wound exploration N/A 02/25/2013    Procedure: EXCISION OF SUTURE GRANULOMA ABDOMINAL WALL;  Surgeon: Jamesetta So, MD;  Location: [facility];  Service: General;  Laterality: N/A;  . Prostate biopsy  08/14/13    FAMILY HISTORY: family history includes Cirrhosis in his father.  SOCIAL HISTORY:  reports that he has never smoked. His smokeless tobacco use includes Chew. He reports that he drinks alcohol. He reports that he does not use illicit drugs.  REVIEW OF SYSTEMS:  Other than that discussed above is noncontributory.  PHYSICAL EXAMINATION: ECOG PERFORMANCE STATUS: 1 - Symptomatic but completely ambulatory  Blood pressure 114/74, pulse 91, temperature 97.5 F (36.4 C), temperature source Oral, resp. rate 16, weight 149 lb (67.586 kg).  GENERAL:alert, no distress and comfortable SKIN: skin color, texture, turgor are normal, no rashes or significant lesions EYES: PERLA; Conjunctiva are pink and non-injected, sclera clear SINUSES: No redness or tenderness over maxillary or ethmoid sinuses OROPHARYNX:no exudate, no erythema on lips, buccal mucosa, or tongue. NECK: supple, thyroid normal size, non-tender, without nodularity. No masses CHEST: Normal AP diameter with no breast masses. LYMPH:  no palpable lymphadenopathy in the cervical, axillary or inguinal LUNGS: clear to auscultation and percussion with normal breathing effort HEART: regular rate & rhythm and no murmurs. ABDOMEN:abdomen soft, non-tender and normal bowel sounds. Nonhealing surgical wound with some  drainage in the lower portion of the incision. Liver and spleen both palpable. No free fluid wave or shifting dullness. MUSCULOSKELETAL:no cyanosis of digits and no clubbing. Range of motion normal.  NEURO: alert & oriented x 3 with fluent speech, no focal motor/sensory deficits   LABORATORY DATA: Appointment on 10/02/2013  Component Date Value Ref Range Status  . WBC 10/02/2013 3.7* 4.0 - 10.5 K/uL Final  . RBC 10/02/2013 3.26* 4.22 - 5.81 MIL/uL Final  . Hemoglobin 10/02/2013 11.7* 13.0 - 17.0 g/dL Final  . HCT 10/02/2013 34.3* 39.0 - 52.0 % Final  . MCV 10/02/2013 105.2* 78.0 - 100.0 fL Final  . MCH 10/02/2013 35.9* 26.0 - 34.0 pg Final  . MCHC 10/02/2013 34.1  30.0 - 36.0 g/dL Final  . RDW 10/02/2013 14.2  11.5 - 15.5 % Final  . Platelets 10/02/2013 75* 150 - 400 K/uL Final   Comment: SPECIMEN CHECKED FOR CLOTS                          CONSISTENT WITH PREVIOUS RESULT  . Neutrophils Relative % 10/02/2013 59  43 - 77 % Final  . Neutro Abs 10/02/2013 2.2  1.7 - 7.7 K/uL Final  . Lymphocytes Relative 10/02/2013 32  12 - 46 % Final  . Lymphs Abs 10/02/2013 1.2  0.7 - 4.0 K/uL Final  . Monocytes Relative 10/02/2013 7  3 - 12 % Final  . Monocytes Absolute 10/02/2013 0.3  0.1 - 1.0 K/uL Final  . Eosinophils Relative 10/02/2013 1  0 -  5 % Final  . Eosinophils Absolute 10/02/2013 0.1  0.0 - 0.7 K/uL Final  . Basophils Relative 10/02/2013 0  0 - 1 % Final  . Basophils Absolute 10/02/2013 0.0  0.0 - 0.1 K/uL Final  . Sodium 10/02/2013 142  137 - 147 mEq/L Final  . Potassium 10/02/2013 3.1* 3.7 - 5.3 mEq/L Final  . Chloride 10/02/2013 100  96 - 112 mEq/L Final  . CO2 10/02/2013 27  19 - 32 mEq/L Final  . Glucose, Bld 10/02/2013 86  70 - 99 mg/dL Final  . BUN 10/02/2013 8  6 - 23 mg/dL Final  . Creatinine, Ser 10/02/2013 0.93  0.50 - 1.35 mg/dL Final  . Calcium 10/02/2013 8.9  8.4 - 10.5 mg/dL Final  . Total Protein 10/02/2013 6.6  6.0 - 8.3 g/dL Final  . Albumin 10/02/2013 3.4* 3.5 - 5.2  g/dL Final  . AST 10/02/2013 59* 0 - 37 U/L Final  . ALT 10/02/2013 23  0 - 53 U/L Final  . Alkaline Phosphatase 10/02/2013 120* 39 - 117 U/L Final  . Total Bilirubin 10/02/2013 2.1* 0.3 - 1.2 mg/dL Final  . GFR calc non Af Amer 10/02/2013 89* >90 mL/min Final  . GFR calc Af Amer 10/02/2013 >90  >90 mL/min Final   Comment: (NOTE)                          The eGFR has been calculated using the CKD EPI equation.                          This calculation has not been validated in all clinical situations.                          eGFR's persistently <90 mL/min signify possible Chronic Kidney                          Disease.  . Anion gap 10/02/2013 15  5 - 15 Final  . CEA 10/02/2013 4.2  0.0 - 5.0 ng/mL Final   Performed at [facility]:     Urinalysis    Component Value Date/Time   COLORURINE YELLOW 03/12/2010 1240   APPEARANCEUR CLEAR 03/12/2010 1240   LABSPEC 1.010 03/12/2010 1240   PHURINE 5.5 03/12/2010 1240   GLUCOSEU NEGATIVE 01/29/2010 1328   HGBUR TRACE* 03/12/2010 1240   BILIRUBINUR NEGATIVE 03/12/2010 1240   KETONESUR NEGATIVE 03/12/2010 1240   PROTEINUR NEGATIVE 03/12/2010 1240   UROBILINOGEN 0.2 03/12/2010 1240   NITRITE NEGATIVE 03/12/2010 1240   LEUKOCYTESUR NEGATIVE 03/12/2010 1240    RADIOGRAPHIC STUDIES:  CT Abdomen Pelvis W Contrast Status: Final result         PACS Images    Show images for CT Abdomen Pelvis W Contrast         Study Result    CLINICAL DATA: Weight loss, abdominal pain. History of colon  cancer.  EXAM:  CT ABDOMEN AND PELVIS WITH CONTRAST  TECHNIQUE:  Multidetector CT imaging of the abdomen and pelvis was performed  using the standard protocol following bolus administration of  intravenous contrast.  CONTRAST: 159m OMNIPAQUE IOHEXOL 300 MG/ML SOLN  COMPARISON: 10/16/2011  FINDINGS:  Lung bases are clear.  Cirrhotic configuration. Superimposed hepatic steatosis. No  suspicious/enhancing hepatic lesions.    Spleen is at the upper limits of normal  for size, measuring 12.8 cm  in craniocaudal dimension.  Suspected small perigastric varices (series 2/ image 22). Portal  vein is patent.  Trace ascites along the inferior right hepatic lobe (series 2/image  38).  Pancreas and adrenal glands are within normal limits.  Gallbladder is contracted. No intrahepatic or extrahepatic ductal  dilatation.  13 mm medial interpolar left renal cyst (series 7/ image 24). Right  kidney is within normal limits. No hydronephrosis.  Prior right hemicolectomy. Two suture lines in the right mid abdomen  (series 2/ images 60 and 66). No evidence of bowel obstruction.  Suspected prior omentectomy. No findings to suggest peritoneal  disease.  No evidence of abdominal aortic aneurysm.  Mild hazy stranding along the celiac axis (series 2/image 27) and  SMA (series 2/image 31), grossly unchanged, nonspecific.  Small retroperitoneal nodes which do not meet pathologic CT size  criteria. No suspicious abdominopelvic lymphadenopathy.  Mild prostatomegaly.  Bladder is mildly thick-walled although underdistended.  Postsurgical changes in the anterior abdominal wall.  Mild degenerative changes of the visualized thoracolumbar spine.  IMPRESSION:  Status post right hemicolectomy.  No evidence of recurrent or metastatic disease in the  abdomen/pelvis.  Cirrhosis with superimposed hepatic steatosis. No  suspicious/enhancing hepatic lesions.  Spleen is at the upper limits of normal for size. Suspected small  perigastric varices and trace perihepatic ascites. Portal vein is  patent.  Electronically Signed  By: Julian Hy M.D.  On: 05/21/2013 15      US Abdomen Complete  10/02/2013   CLINICAL DATA:  Cirrhosis.  Followup.  EXAM: ULTRASOUND ABDOMEN COMPLETE  COMPARISON:  CT 05/21/2013.  Ultrasound 03/06/2013  FINDINGS: Gallbladder:  Contracted without evidence of stones. The wall is thick because of the contraction. No  Murphy sign.  Common bile duct:  Diameter: Normal at 2.7 mm  Liver:  Diffusely echogenic without evidence of focal lesion or biliary ductal dilatation. Some enlargement of liver subjectively.  IVC:  Normal  Pancreas:  Normal  Spleen:  Enlarged at 13.6 cm with a volume calculation of 755 cc  Right Kidney:  Length: 10.7 cm in length. Echogenicity within normal limits. No mass or hydronephrosis visualized.  Left Kidney:  Length: 10.1 cm in length.  1 cm cysts of the medial cortex.  Abdominal aorta:  Normal  Other findings:  No ascites visible today.  IMPRESSION: The liver shows fatty change and is enlarged. Previous imaging has suggested early cirrhosis, but I can not demonstrate that by today's ultrasound.  Splenomegaly.  Contracted gallbladder, otherwise unremarkable.   Electronically Signed   By: Nelson Chimes M.D.   On: 10/02/2013 09:11    ASSESSMENT:  #1. Stage I colon cancer, status post colon resection, no evidence of disease.  #2. Cirrhosis of the liver with pancytopenia and hypersplenism syndrome #3. Weight loss of 12 pounds over the past 6 months. #4. Nonhealing surgical wound. #5. Also the prostate, status post biopsy with 2 of 12 cores showing adenocarcinoma in less than 5% of each core, Gleason score 3+3= 6 #6. Possible pancreatic insufficiency.    PLAN:  #1. Referral to Dr. Arnoldo Morale for consideration of non-healing abdominal wound. #2. CT chest abdomen and pelvis to assess status of malignancies. #3. Trial of Creon with each meal. #4. Followup in 3 weeks with no labs.   All questions were answered. The patient knows to call the clinic with any problems, questions or concerns. We can certainly see the patient much sooner if necessary.   I spent 30 minutes counseling  the patient face to face. The total time spent in the appointment was 40 minutes.    Peter Bradford, MD 10/05/2013 10:04 AM  DISCLAIMER:  This note was dictated with voice recognition software.  Similar sounding  words can inadvertently be transcribed inaccurately and may not be corrected upon review.

## 2013-10-05 NOTE — Progress Notes (Signed)
Labs for psa

## 2013-10-06 LAB — PSA: PSA: 9.49 ng/mL — ABNORMAL HIGH (ref <=4.00)

## 2013-10-06 LAB — SOLUBLE TRANSFERRIN RECEPTOR: TRANSFERRIN RECEPTOR, SOLUBLE: 0.87 mg/L (ref 0.76–1.76)

## 2013-10-09 ENCOUNTER — Encounter (HOSPITAL_COMMUNITY): Payer: Self-pay

## 2013-10-09 ENCOUNTER — Other Ambulatory Visit (HOSPITAL_COMMUNITY): Payer: Self-pay | Admitting: Hematology and Oncology

## 2013-10-09 ENCOUNTER — Ambulatory Visit (HOSPITAL_COMMUNITY)
Admission: RE | Admit: 2013-10-09 | Discharge: 2013-10-09 | Disposition: A | Discharge status: Home / Self Care | Payer: Medicaid Other | Source: Ambulatory Visit | Attending: Hematology and Oncology | Admitting: Hematology and Oncology

## 2013-10-09 ENCOUNTER — Other Ambulatory Visit (HOSPITAL_COMMUNITY): Payer: Medicaid Other

## 2013-10-09 ENCOUNTER — Telehealth (HOSPITAL_COMMUNITY): Payer: Self-pay

## 2013-10-09 DIAGNOSIS — I2584 Coronary atherosclerosis due to calcified coronary lesion: Secondary | ICD-10-CM | POA: Insufficient documentation

## 2013-10-09 DIAGNOSIS — Z9049 Acquired absence of other specified parts of digestive tract: Secondary | ICD-10-CM | POA: Insufficient documentation

## 2013-10-09 DIAGNOSIS — K7689 Other specified diseases of liver: Secondary | ICD-10-CM | POA: Insufficient documentation

## 2013-10-09 DIAGNOSIS — C189 Malignant neoplasm of colon, unspecified: Secondary | ICD-10-CM

## 2013-10-09 MED ORDER — IOHEXOL 300 MG/ML  SOLN
100.0000 mL | Freq: Once | INTRAMUSCULAR | Status: AC | PRN
  Administered 2013-10-09: 100 mL via INTRAVENOUS

## 2013-10-09 NOTE — Telephone Encounter (Signed)
Message copied by Mellissa Kohut on Fri Oct 09, 2013 11:59 AM ------      Message from: Farrel Gobble A      Created: Fri Oct 09, 2013 11:05 AM       Because of an abnormality which I think is a hemangioma in his liver, and per recommendation of the imaging radiologist, am ordering an MRI of the liver with contrast to better characterize the lesion seen on CAT scan. Please notify the patient, get authorization, and have him call back the day after the studies done for discussion of the results. ------

## 2013-10-09 NOTE — Telephone Encounter (Signed)
Patient notified.  Will send letter with appointments to patient.

## 2013-10-22 ENCOUNTER — Ambulatory Visit (HOSPITAL_COMMUNITY)
Admission: RE | Admit: 2013-10-22 | Discharge: 2013-10-22 | Disposition: A | Discharge status: Home / Self Care | Payer: Medicaid Other | Source: Ambulatory Visit | Attending: Hematology and Oncology | Admitting: Hematology and Oncology

## 2013-10-22 DIAGNOSIS — K7689 Other specified diseases of liver: Secondary | ICD-10-CM

## 2013-10-22 MED ORDER — SODIUM CHLORIDE 0.9 % IJ SOLN
INTRAMUSCULAR | Status: AC
  Filled 2013-10-22: qty 3

## 2013-10-22 MED ORDER — GADOBENATE DIMEGLUMINE 529 MG/ML IV SOLN
13.0000 mL | Freq: Once | INTRAVENOUS | Status: AC | PRN
  Administered 2013-10-22: 13 mL via INTRAVENOUS

## 2013-10-23 ENCOUNTER — Ambulatory Visit (INDEPENDENT_AMBULATORY_CARE_PROVIDER_SITE_OTHER): Payer: Medicaid Other | Admitting: Urology

## 2013-10-23 ENCOUNTER — Other Ambulatory Visit (HOSPITAL_COMMUNITY): Payer: Self-pay | Admitting: Hematology and Oncology

## 2013-10-23 DIAGNOSIS — C61 Malignant neoplasm of prostate: Secondary | ICD-10-CM | POA: Insufficient documentation

## 2013-10-23 DIAGNOSIS — N401 Enlarged prostate with lower urinary tract symptoms: Secondary | ICD-10-CM

## 2013-10-30 ENCOUNTER — Encounter (HOSPITAL_BASED_OUTPATIENT_CLINIC_OR_DEPARTMENT_OTHER): Payer: Medicaid Other

## 2013-10-30 ENCOUNTER — Encounter (HOSPITAL_COMMUNITY): Payer: Self-pay

## 2013-10-30 ENCOUNTER — Encounter (HOSPITAL_COMMUNITY): Payer: Medicaid Other | Attending: Hematology and Oncology

## 2013-10-30 VITALS — BP 111/74 | HR 91 | Temp 97.9°F | Resp 18 | Wt 152.4 lb

## 2013-10-30 DIAGNOSIS — R972 Elevated prostate specific antigen [PSA]: Secondary | ICD-10-CM | POA: Diagnosis not present

## 2013-10-30 DIAGNOSIS — K7030 Alcoholic cirrhosis of liver without ascites: Secondary | ICD-10-CM | POA: Insufficient documentation

## 2013-10-30 DIAGNOSIS — Z888 Allergy status to other drugs, medicaments and biological substances status: Secondary | ICD-10-CM | POA: Diagnosis not present

## 2013-10-30 DIAGNOSIS — K219 Gastro-esophageal reflux disease without esophagitis: Secondary | ICD-10-CM | POA: Diagnosis not present

## 2013-10-30 DIAGNOSIS — K8020 Calculus of gallbladder without cholecystitis without obstruction: Secondary | ICD-10-CM | POA: Insufficient documentation

## 2013-10-30 DIAGNOSIS — R197 Diarrhea, unspecified: Secondary | ICD-10-CM | POA: Insufficient documentation

## 2013-10-30 DIAGNOSIS — F17200 Nicotine dependence, unspecified, uncomplicated: Secondary | ICD-10-CM | POA: Diagnosis not present

## 2013-10-30 DIAGNOSIS — K7460 Unspecified cirrhosis of liver: Secondary | ICD-10-CM

## 2013-10-30 DIAGNOSIS — D61818 Other pancytopenia: Secondary | ICD-10-CM | POA: Diagnosis not present

## 2013-10-30 DIAGNOSIS — D696 Thrombocytopenia, unspecified: Secondary | ICD-10-CM | POA: Diagnosis not present

## 2013-10-30 DIAGNOSIS — C189 Malignant neoplasm of colon, unspecified: Secondary | ICD-10-CM

## 2013-10-30 DIAGNOSIS — R634 Abnormal weight loss: Secondary | ICD-10-CM

## 2013-10-30 DIAGNOSIS — K3184 Gastroparesis: Secondary | ICD-10-CM | POA: Diagnosis not present

## 2013-10-30 DIAGNOSIS — D638 Anemia in other chronic diseases classified elsewhere: Secondary | ICD-10-CM | POA: Diagnosis not present

## 2013-10-30 DIAGNOSIS — D731 Hypersplenism: Secondary | ICD-10-CM | POA: Diagnosis not present

## 2013-10-30 DIAGNOSIS — C61 Malignant neoplasm of prostate: Secondary | ICD-10-CM | POA: Insufficient documentation

## 2013-10-30 DIAGNOSIS — Z9889 Other specified postprocedural states: Secondary | ICD-10-CM | POA: Diagnosis not present

## 2013-10-30 DIAGNOSIS — Z885 Allergy status to narcotic agent status: Secondary | ICD-10-CM | POA: Diagnosis not present

## 2013-10-30 DIAGNOSIS — M109 Gout, unspecified: Secondary | ICD-10-CM | POA: Insufficient documentation

## 2013-10-30 DIAGNOSIS — C180 Malignant neoplasm of cecum: Secondary | ICD-10-CM | POA: Insufficient documentation

## 2013-10-30 DIAGNOSIS — N189 Chronic kidney disease, unspecified: Secondary | ICD-10-CM | POA: Diagnosis not present

## 2013-10-30 DIAGNOSIS — E860 Dehydration: Secondary | ICD-10-CM | POA: Insufficient documentation

## 2013-10-30 DIAGNOSIS — K222 Esophageal obstruction: Secondary | ICD-10-CM | POA: Insufficient documentation

## 2013-10-30 DIAGNOSIS — F1020 Alcohol dependence, uncomplicated: Secondary | ICD-10-CM | POA: Insufficient documentation

## 2013-10-30 DIAGNOSIS — T8189XA Other complications of procedures, not elsewhere classified, initial encounter: Secondary | ICD-10-CM | POA: Insufficient documentation

## 2013-10-30 DIAGNOSIS — I8500 Esophageal varices without bleeding: Secondary | ICD-10-CM | POA: Diagnosis not present

## 2013-10-30 DIAGNOSIS — K8689 Other specified diseases of pancreas: Secondary | ICD-10-CM

## 2013-10-30 DIAGNOSIS — K8681 Exocrine pancreatic insufficiency: Secondary | ICD-10-CM

## 2013-10-30 LAB — CBC WITH DIFFERENTIAL/PLATELET
Basophils Absolute: 0 10*3/uL (ref 0.0–0.1)
Basophils Relative: 0 % (ref 0–1)
Eosinophils Absolute: 0 10*3/uL (ref 0.0–0.7)
Eosinophils Relative: 1 % (ref 0–5)
HCT: 32.9 % — ABNORMAL LOW (ref 39.0–52.0)
HEMOGLOBIN: 11.2 g/dL — AB (ref 13.0–17.0)
LYMPHS ABS: 1 10*3/uL (ref 0.7–4.0)
LYMPHS PCT: 32 % (ref 12–46)
MCH: 35.3 pg — ABNORMAL HIGH (ref 26.0–34.0)
MCHC: 34 g/dL (ref 30.0–36.0)
MCV: 103.8 fL — ABNORMAL HIGH (ref 78.0–100.0)
Monocytes Absolute: 0.3 10*3/uL (ref 0.1–1.0)
Monocytes Relative: 10 % (ref 3–12)
NEUTROS PCT: 56 % (ref 43–77)
Neutro Abs: 1.8 10*3/uL (ref 1.7–7.7)
PLATELETS: 75 10*3/uL — AB (ref 150–400)
RBC: 3.17 MIL/uL — AB (ref 4.22–5.81)
RDW: 14.2 % (ref 11.5–15.5)
WBC: 3.2 10*3/uL — ABNORMAL LOW (ref 4.0–10.5)

## 2013-10-30 MED ORDER — OXYCODONE HCL 10 MG PO TABS: ORAL_TABLET | ORAL | Status: DC

## 2013-10-30 NOTE — Progress Notes (Signed)
LABS FOR CBCD,SOTRAN

## 2013-10-30 NOTE — Progress Notes (Signed)
[place]  OFFICE PROGRESS NOTE  Lanette Hampshire, MD [address] [place] [address]  DIAGNOSIS: Hypersplenism syndrome - Plan: CBC with Differential, Soluble transferrin receptor  Cirrhosis of liver without mention of alcohol - Plan: CBC with Differential, Soluble transferrin receptor  Colon cancer  Prostate cancer  Exocrine pancreatic insufficiency  Chief Complaint  Patient presents with  . Colon Cancer  . Weight Loss  . Prostate Cancer  . Cirrhosis of the liver    CURRENT THERAPY: Watchful expectation  INTERVAL HISTORY: Peter [name] 63 y.o. male returns for followup of stage I colon cancer in the setting of cirrhosis of the liver with hypersplenism and mild thrombocytopenia.  08/14/2013 the patient underwent prostate ultrasound with multiple biopsies 2 of 12 showing evidence of adenocarcinoma, Gleason score 3+3=6 involving less than 5% of each core. Since his last visit he has undergone MRI the liver to further characterize previously noted abnormalities particularly in light of a 12 pound weight loss unexplained over the previous 6 months.. Diarrhea has improved since starting on Creon. He has gained 3 pounds since his last visit. He has had 1 episode of his lower abdominal wound bleeding associated with a 1 one are 1 fever. He has seen Dr. Arnoldo Morale who cauterized a portion of the wound. An area below the area that was cauterized as were the bleeding occurred. He denies any cough, sore throat, skin rash, lower extremity swelling or redness, pruritus, headache, or seizures.    MEDICAL HISTORY: Past Medical History  Diagnosis Date  . Gout   . GERD (gastroesophageal reflux disease)   . Cirrhosis     ?ETOH related, afp 02/05/12= 4.9, ct on 10/16/11,no  Hep A/B vaccines  . Colon cancer Dec 2011    Cecum  . ARF (acute renal failure)     hospitalization Jan 2012  . H/O ETOH abuse   . S/P colonoscopy Dec 2011    cecal  mass, tubulovillous adenoma at splenic flexure  . S/P endoscopy Dec 2011    Schatzki's ring, Grade 1 esophageal varices, antral/body erosions  . Anemia of chronic disease 08/10/2010  . Renal insufficiency 10/24/2010  . Schatzki's ring   . Varices, esophageal   . Gastroparesis 01/2013    INTERIM HISTORY: has Alcoholic cirrhosis of liver; EPIGASTRIC PAIN; TRANSAMINASES, SERUM, ELEVATED; Personal history of colon cancer; Dehydration; Colon cancer; Anemia of chronic disease; Diarrhea; Weight loss, abnormal; Renal insufficiency; Retroperitoneal lymphadenopathy; Esophageal varices without bleeding; Chronic diarrhea; GERD (gastroesophageal reflux disease); Nausea alone; Hypersplenism syndrome; Thrombocytopenia due to hypersplenism; Elevated PSA; and Prostate cancer on his problem list.    ALLERGIES:  is allergic to ace inhibitors; lorazepam; and percocet.  MEDICATIONS: has a current medication list which includes the following prescription(s): alprazolam, dexlansoprazole, diphenoxylate-atropine, esomeprazole, hydrocodone-acetaminophen, metoclopramide, milk thistle, nadolol, ondansetron, pancrelipase (lip-prot-amyl), and oxycodone hcl.  SURGICAL HISTORY:  Past Surgical History  Procedure Laterality Date  . Colon surgery  02/24/2010    colon cancer (cecum)  . Exploratory laparotomy  03/03/2010    anastomotic leak, developed EC fistula  . Exploratory laparotomy w/ bowel resection  04/18/2010    ileostomy placed, (hx of EC fistula, anastomotic leak). About two feet of ileum removed.  . Colostomy    . Percutaneous drainage of intraabdominal abscess  07/2010 and 09/2010  . Partial colectomy  10/30/2010    Procedure: PARTIAL COLECTOMY;  Surgeon: Jamesetta So;  Location: [facility];  Service: General;  Laterality: N/A;  .  Ileostomy closure  10/30/2010    Procedure: ILEOSTOMY TAKEDOWN;  Surgeon: Jamesetta So;  Location: [facility];  Service: General;  Laterality: N/A;  . Colonoscopy  Dec 2011    cecal mass,  tubulovillous adenoma at splenic flexure  . Esophagogastroduodenoscopy  Dec 2011    Schatzki's ring, Grade 1 esophageal varices, antral/body erosions  . Colonoscopy N/A 04/02/2012    TGP:QDIYME residual rectal and colonic mucosa. Next colonoscopy in 03/2015.  Marland Kitchen Esophagogastroduodenoscopy N/A 08/06/2012    RMR: Grade 1 esophageal varices. Abnormal distal esophageal mucosa-3 short segment Barrett's-like appearance but negative biopsy for barrett's. mild erosive reflux esophagitis, non-critical Schatzki's ring, hiatal hernia, portal gastropathy.   . Esophageal banding N/A 08/06/2012    no banding   . Wound exploration N/A 02/25/2013    Procedure: EXCISION OF SUTURE GRANULOMA ABDOMINAL WALL;  Surgeon: Jamesetta So, MD;  Location: [facility];  Service: General;  Laterality: N/A;  . Prostate biopsy  08/14/13    FAMILY HISTORY: family history includes Cirrhosis in his father.  SOCIAL HISTORY:  reports that he has never smoked. His smokeless tobacco use includes Chew. He reports that he drinks alcohol. He reports that he does not use illicit drugs.  REVIEW OF SYSTEMS:  Other than that discussed above is noncontributory.  PHYSICAL EXAMINATION: ECOG PERFORMANCE STATUS: 1 - Symptomatic but completely ambulatory  Blood pressure 111/74, pulse 91, temperature 97.9 F (36.6 C), temperature source Oral, resp. rate 18, weight 152 lb 6.4 oz (69.128 kg), SpO2 98.00%.  GENERAL:alert, no distress and comfortable SKIN: skin color, texture, turgor are normal, no rashes or significant lesions EYES: PERLA; Conjunctiva are pink and non-injected, sclera clear SINUSES: No redness or tenderness over maxillary or ethmoid sinuses OROPHARYNX:no exudate, no erythema on lips, buccal mucosa, or tongue. NECK: supple, thyroid normal size, non-tender, without nodularity. No masses CHEST: Increased AP diameter with no breast masses. LYMPH:  no palpable lymphadenopathy in the cervical, axillary or inguinal LUNGS: clear to  auscultation and percussion with normal breathing effort HEART: regular rate & rhythm and no murmurs. ABDOMEN:abdomen soft, non-tender and normal bowel sounds. Midline abdominal wound with minimal drainage but no hemorrhage. No free fluid wave or shifting dullness.  MUSCULOSKELETAL:no cyanosis of digits and no clubbing. Range of motion normal.  NEURO: alert & oriented x 3 with fluent speech, no focal motor/sensory deficits. No asterixis.    LABORATORY DATA: Lab on 10/30/2013  Component Date Value Ref Range Status  . WBC 10/30/2013 3.2* 4.0 - 10.5 K/uL Final  . RBC 10/30/2013 3.17* 4.22 - 5.81 MIL/uL Final  . Hemoglobin 10/30/2013 11.2* 13.0 - 17.0 g/dL Final  . HCT 10/30/2013 32.9* 39.0 - 52.0 % Final  . MCV 10/30/2013 103.8* 78.0 - 100.0 fL Final  . MCH 10/30/2013 35.3* 26.0 - 34.0 pg Final  . MCHC 10/30/2013 34.0  30.0 - 36.0 g/dL Final  . RDW 10/30/2013 14.2  11.5 - 15.5 % Final  . Platelets 10/30/2013 75* 150 - 400 K/uL Final   Comment: SPECIMEN CHECKED FOR CLOTS                          CONSISTENT WITH PREVIOUS RESULT  . Neutrophils Relative % 10/30/2013 56  43 - 77 % Final  . Neutro Abs 10/30/2013 1.8  1.7 - 7.7 K/uL Final  . Lymphocytes Relative 10/30/2013 32  12 - 46 % Final  . Lymphs Abs 10/30/2013 1.0  0.7 - 4.0 K/uL Final  . Monocytes Relative  10/30/2013 10  3 - 12 % Final  . Monocytes Absolute 10/30/2013 0.3  0.1 - 1.0 K/uL Final  . Eosinophils Relative 10/30/2013 1  0 - 5 % Final  . Eosinophils Absolute 10/30/2013 0.0  0.0 - 0.7 K/uL Final  . Basophils Relative 10/30/2013 0  0 - 1 % Final  . Basophils Absolute 10/30/2013 0.0  0.0 - 0.1 K/uL Final  Office Visit on 10/05/2013  Component Date Value Ref Range Status  . PSA 10/05/2013 9.49* <=4.00 ng/mL Final   Comment: (NOTE)                          Test Methodology: ECLIA PSA (Electrochemiluminescence Immunoassay)                          For PSA values from 2.5-4.0, particularly in younger men <60 years                           old, the AUA and NCCN suggest testing for % Free PSA (3515) and                          evaluation of the rate of increase in PSA (PSA velocity).                          Performed at [facility] on 10/02/2013  Component Date Value Ref Range Status  . WBC 10/02/2013 3.7* 4.0 - 10.5 K/uL Final  . RBC 10/02/2013 3.26* 4.22 - 5.81 MIL/uL Final  . Hemoglobin 10/02/2013 11.7* 13.0 - 17.0 g/dL Final  . HCT 10/02/2013 34.3* 39.0 - 52.0 % Final  . MCV 10/02/2013 105.2* 78.0 - 100.0 fL Final  . MCH 10/02/2013 35.9* 26.0 - 34.0 pg Final  . MCHC 10/02/2013 34.1  30.0 - 36.0 g/dL Final  . RDW 10/02/2013 14.2  11.5 - 15.5 % Final  . Platelets 10/02/2013 75* 150 - 400 K/uL Final   Comment: SPECIMEN CHECKED FOR CLOTS                          CONSISTENT WITH PREVIOUS RESULT  . Neutrophils Relative % 10/02/2013 59  43 - 77 % Final  . Neutro Abs 10/02/2013 2.2  1.7 - 7.7 K/uL Final  . Lymphocytes Relative 10/02/2013 32  12 - 46 % Final  . Lymphs Abs 10/02/2013 1.2  0.7 - 4.0 K/uL Final  . Monocytes Relative 10/02/2013 7  3 - 12 % Final  . Monocytes Absolute 10/02/2013 0.3  0.1 - 1.0 K/uL Final  . Eosinophils Relative 10/02/2013 1  0 - 5 % Final  . Eosinophils Absolute 10/02/2013 0.1  0.0 - 0.7 K/uL Final  . Basophils Relative 10/02/2013 0  0 - 1 % Final  . Basophils Absolute 10/02/2013 0.0  0.0 - 0.1 K/uL Final  . Sodium 10/02/2013 142  137 - 147 mEq/L Final  . Potassium 10/02/2013 3.1* 3.7 - 5.3 mEq/L Final  . Chloride 10/02/2013 100  96 - 112 mEq/L Final  . CO2 10/02/2013 27  19 - 32 mEq/L Final  . Glucose, Bld 10/02/2013 86  70 - 99 mg/dL Final  . BUN 10/02/2013 8  6 - 23 mg/dL Final  . Creatinine, Ser 10/02/2013 0.93  0.50 - 1.35 mg/dL  Final  . Calcium 10/02/2013 8.9  8.4 - 10.5 mg/dL Final  . Total Protein 10/02/2013 6.6  6.0 - 8.3 g/dL Final  . Albumin 10/02/2013 3.4* 3.5 - 5.2 g/dL Final  . AST 10/02/2013 59* 0 - 37 U/L Final  . ALT 10/02/2013 23  0 - 53 U/L  Final  . Alkaline Phosphatase 10/02/2013 120* 39 - 117 U/L Final  . Total Bilirubin 10/02/2013 2.1* 0.3 - 1.2 mg/dL Final  . GFR calc non Af Amer 10/02/2013 89* >90 mL/min Final  . GFR calc Af Amer 10/02/2013 >90  >90 mL/min Final   Comment: (NOTE)                          The eGFR has been calculated using the CKD EPI equation.                          This calculation has not been validated in all clinical situations.                          eGFR's persistently <90 mL/min signify possible Chronic Kidney                          Disease.  . Anion gap 10/02/2013 15  5 - 15 Final  . CEA 10/02/2013 4.2  0.0 - 5.0 ng/mL Final   Performed at [facility]  . Transferrin Receptor, Soluble 10/02/2013 0.87  0.76 - 1.76 mg/L Final   Performed at [place]: no new pathology.   Urinalysis    Component Value Date/Time   COLORURINE YELLOW 03/12/2010 1240   APPEARANCEUR CLEAR 03/12/2010 1240   LABSPEC 1.010 03/12/2010 1240   PHURINE 5.5 03/12/2010 1240   GLUCOSEU NEGATIVE 01/29/2010 1328   HGBUR TRACE* 03/12/2010 1240   BILIRUBINUR NEGATIVE 03/12/2010 1240   KETONESUR NEGATIVE 03/12/2010 1240   PROTEINUR NEGATIVE 03/12/2010 1240   UROBILINOGEN 0.2 03/12/2010 1240   NITRITE NEGATIVE 03/12/2010 1240   LEUKOCYTESUR NEGATIVE 03/12/2010 1240    RADIOGRAPHIC STUDIES: Ct Chest W Contrast  10/09/2013   CLINICAL DATA:  Colon cancer, cirrhosis.  EXAM: CT CHEST, ABDOMEN, AND PELVIS WITH CONTRAST  TECHNIQUE: Multidetector CT imaging of the chest, abdomen and pelvis was performed following the standard protocol during bolus administration of intravenous contrast.  CONTRAST:  152m OMNIPAQUE IOHEXOL 300 MG/ML  SOLN  COMPARISON:  CT abdomen pelvis 05/21/2013 and 10/16/2011.  FINDINGS: CT CHEST FINDINGS  No pathologically enlarged mediastinal, hilar or axillary lymph nodes. Heart size normal. Coronary artery calcification. No pericardial effusion.  Clustered peribronchovascular  nodularity in the right upper lobe is likely postinfectious in etiology. Lungs are otherwise clear. No pleural fluid. Airway is unremarkable.  CT ABDOMEN AND PELVIS FINDINGS  Liver is decreased in attenuation diffusely. An 8 mm low-attenuation lesion in the right hepatic lobe is not well seen on prior exams. A 9 mm lesion in the left hepatic lobe (series 2, image 44) is stable. Gallbladder is decompressed. Adrenal glands and right kidney are unremarkable. A 1.6 cm low-attenuation lesion in the medial left kidney is fluid in attenuation, stable and likely a cyst. Spleen is prominent. Pancreas, stomach and small bowel are unremarkable. Right hemicolectomy. Colon is otherwise unremarkable.  Calcifications are seen in the prostate. Bladder is fairly decompressed. Atherosclerotic calcification of the arterial vasculature without abdominal  aortic aneurysm. No pathologically enlarged lymph nodes. Scattered stranding in the small bowel mesentery and omentum, stable. Trace perihepatic ascites. No worrisome lytic or sclerotic lesions.  IMPRESSION: 1. Postoperative changes of right hemicolectomy without evidence of recurrent or metastatic disease. 2. Hepatic steatosis. 8 mm low-attenuation right hepatic lobe lesion appears new. If further evaluation is desired, MR abdomen without and with contrast is recommended. 3. Coronary artery calcification.   Electronically Signed   By: Lorin Picket M.D.   On: 10/09/2013 10:52   US Abdomen Complete  10/02/2013   CLINICAL DATA:  Cirrhosis.  Followup.  EXAM: ULTRASOUND ABDOMEN COMPLETE  COMPARISON:  CT 05/21/2013.  Ultrasound 03/06/2013  FINDINGS: Gallbladder:  Contracted without evidence of stones. The wall is thick because of the contraction. No Murphy sign.  Common bile duct:  Diameter: Normal at 2.7 mm  Liver:  Diffusely echogenic without evidence of focal lesion or biliary ductal dilatation. Some enlargement of liver subjectively.  IVC:  Normal  Pancreas:  Normal  Spleen:   Enlarged at 13.6 cm with a volume calculation of 755 cc  Right Kidney:  Length: 10.7 cm in length. Echogenicity within normal limits. No mass or hydronephrosis visualized.  Left Kidney:  Length: 10.1 cm in length.  1 cm cysts of the medial cortex.  Abdominal aorta:  Normal  Other findings:  No ascites visible today.  IMPRESSION: The liver shows fatty change and is enlarged. Previous imaging has suggested early cirrhosis, but I can not demonstrate that by today's ultrasound.  Splenomegaly.  Contracted gallbladder, otherwise unremarkable.   Electronically Signed   By: Nelson Chimes M.D.   On: 10/02/2013 09:11   Ct Abdomen Pelvis W Contrast  10/09/2013   CLINICAL DATA:  Colon cancer, cirrhosis.  EXAM: CT CHEST, ABDOMEN, AND PELVIS WITH CONTRAST  TECHNIQUE: Multidetector CT imaging of the chest, abdomen and pelvis was performed following the standard protocol during bolus administration of intravenous contrast.  CONTRAST:  130m OMNIPAQUE IOHEXOL 300 MG/ML  SOLN  COMPARISON:  CT abdomen pelvis 05/21/2013 and 10/16/2011.  FINDINGS: CT CHEST FINDINGS  No pathologically enlarged mediastinal, hilar or axillary lymph nodes. Heart size normal. Coronary artery calcification. No pericardial effusion.  Clustered peribronchovascular nodularity in the right upper lobe is likely postinfectious in etiology. Lungs are otherwise clear. No pleural fluid. Airway is unremarkable.  CT ABDOMEN AND PELVIS FINDINGS  Liver is decreased in attenuation diffusely. An 8 mm low-attenuation lesion in the right hepatic lobe is not well seen on prior exams. A 9 mm lesion in the left hepatic lobe (series 2, image 44) is stable. Gallbladder is decompressed. Adrenal glands and right kidney are unremarkable. A 1.6 cm low-attenuation lesion in the medial left kidney is fluid in attenuation, stable and likely a cyst. Spleen is prominent. Pancreas, stomach and small bowel are unremarkable. Right hemicolectomy. Colon is otherwise unremarkable.   Calcifications are seen in the prostate. Bladder is fairly decompressed. Atherosclerotic calcification of the arterial vasculature without abdominal aortic aneurysm. No pathologically enlarged lymph nodes. Scattered stranding in the small bowel mesentery and omentum, stable. Trace perihepatic ascites. No worrisome lytic or sclerotic lesions.  IMPRESSION: 1. Postoperative changes of right hemicolectomy without evidence of recurrent or metastatic disease. 2. Hepatic steatosis. 8 mm low-attenuation right hepatic lobe lesion appears new. If further evaluation is desired, MR abdomen without and with contrast is recommended. 3. Coronary artery calcification.   Electronically Signed   By: MLorin PicketM.D.   On: 10/09/2013 10:52   Mr Liver W Wo  Contrast  10/22/2013   CLINICAL DATA:  Indeterminate liver lesion on CT.  Colon carcinoma.  EXAM: MRI ABDOMEN WITHOUT AND WITH CONTRAST  TECHNIQUE: Multiplanar multisequence MR imaging of the abdomen was performed both before and after the administration of intravenous contrast.  CONTRAST:  62m MULTIHANCE GADOBENATE DIMEGLUMINE 529 MG/ML IV SOLN  COMPARISON:  CT on 10/09/2013  FINDINGS: Liver: Diffuse hepatic steatosis demonstrated on chemical shift imaging. There are several sub-cm lesions seen in both the right and left lobes which show T1 and T2 hypointensity and show probable mild peripheral rim enhancement (series 25). These are not well visualized on diffusion imaging. These are difficult to characterize given their small size, however hepatic metastases cannot be excluded.  Gallbladder/Biliary: Contracted gallbladder noted. No evidence of biliary ductal dilatation.  Pancreas: No mass, inflammatory changes, or other parenchymal abnormality identified.  Spleen:  Within normal limits in size and appearance.  Adrenal Glands:  No mass identified.  Kidneys: No masses identified. No evidence of hydronephrosis. Tiny left renal cyst noted.  Lymph Nodes:  No pathologically  enlarged lymph nodes identified.  Bowel: Postop changes again noted from recent right hemicolectomy.  Vascular:  No evidence of abdominal aortic aneurysm.  Musculoskeletal:  No suspicious bone lesions identified.  Other:  None.  IMPRESSION: Several tiny subcentimeter nonspecific lesions in both right and left hepatic lobes. These are difficult to characterize given their small size, and tiny hepatic metastases cannot be excluded. Consider continued followup by MRI in 3-6 months.  Hepatic steatosis.   Electronically Signed   By: JEarle GellM.D.   On: 10/22/2013 14:57    ASSESSMENT:  #1. Stage I colon cancer, status post colon resection, no evidence of disease.  #2. Cirrhosis of the liver with pancytopenia and hypersplenism syndrome  #3. Weight loss of 12 pounds over the past 6 months, 3 pound weight gain while on pancreatic enzyme replacement. #4. Nonhealing surgical wound.  #5. Carcinoma of the prostate, status post biopsy with 2 of 12 cores showing adenocarcinoma in less than 5% of each core, Gleason score 3+3= 6  #6. Pancreatic insufficiency    PLAN:  #1. Abdominal wound may heal better with packing. Patient was told to consult with Dr. JArnoldo Moraleagain. #2. Watchful expectation regarding prostate cancer. #3. Followup in 3 months with CBC, chem profile, CEA, PSA. #4. Oxycodone 10 mg to take 1 or 2 every 4 hours to control pain. Patient was told to call back in the week regarding his analgesic requirement so that longer acting probably could be used if required.    All questions were answered. The patient knows to call the clinic with any problems, questions or concerns. We can certainly see the patient much sooner if necessary.   I spent 30 minutes counseling the patient face to face. The total time spent in the appointment was 40 minutes.    FDoroteo Bradford MD 10/30/2013 9:49 AM  DISCLAIMER:  This note was dictated with voice recognition software.  Similar sounding words can  inadvertently be transcribed inaccurately and may not be corrected upon review.

## 2013-10-30 NOTE — Patient Instructions (Signed)
[place] Discharge Instructions  RECOMMENDATIONS MADE BY THE CONSULTANT AND ANY TEST RESULTS WILL BE SENT TO YOUR REFERRING PHYSICIAN.  EXAM FINDINGS BY THE PHYSICIAN TODAY AND SIGNS OR SYMPTOMS TO REPORT TO CLINIC OR PRIMARY PHYSICIAN: Exam and findings as discussed by Dr. Barnet Glasgow.  Scans were stable.  Continue creon.  If you find that you are having to take several of the oxycodone tablets each day, let us know and MD will put you on a long acting pain medication.  See surgeon about the bleeding.  Call next week and let us know how you are doing.  MEDICATIONS PRESCRIBED:  Oxycodone - take as directed  INSTRUCTIONS/FOLLOW-UP: Labs and office visit in 3 months.  Thank you for choosing [facility] to provide your oncology and hematology care.  To afford each patient quality time with our providers, please arrive at least 15 minutes before your scheduled appointment time.  With your help, our goal is to use those 15 minutes to complete the necessary work-up to ensure our physicians have the information they need to help with your evaluation and healthcare recommendations.    Effective January 1st, 2014, we ask that you re-schedule your appointment with our physicians should you arrive 10 or more minutes late for your appointment.  We strive to give you quality time with our providers, and arriving late affects you and other patients whose appointments are after yours.    Again, thank you for choosing [facility].  Our hope is that these requests will decrease the amount of time that you wait before being seen by our physicians.       _____________________________________________________________  Should you have questions after your visit to [facility], please contact our office at [phone] between the hours of 8:30 a.m. and 4:30 p.m.  Voicemails left after 4:30 p.m. will not be returned until the following business day.  For  prescription refill requests, have your pharmacy contact our office with your prescription refill request.    _______________________________________________________________  We hope that we have given you very good care.  You may receive a patient satisfaction survey in the mail, please complete it and return it as soon as possible.  We value your feedback!  _______________________________________________________________  Have you asked about our STAR program?  STAR stands for Survivorship Training and Rehabilitation, and this is a nationally recognized cancer care program that focuses on survivorship and rehabilitation.  Cancer and cancer treatments may cause problems, such as, pain, making you feel tired and keeping you from doing the things that you need or want to do. Cancer rehabilitation can help. Our goal is to reduce these troubling effects and help you have the best quality of life possible.  You may receive a survey from a nurse that asks questions about your current state of health.  Based on the survey results, all eligible patients will be referred to the [facility] program for an evaluation so we can better serve you!  A frequently asked questions sheet is available upon request.

## 2013-11-02 ENCOUNTER — Telehealth (HOSPITAL_COMMUNITY): Payer: Self-pay

## 2013-11-02 NOTE — Telephone Encounter (Signed)
Call from wife. "Peter [name] took the oxycodone 10 mg on Friday and again on Saturday.  He said it relieved his pain but has made him unable to function.  He can't hardly move and has been a little confused at times also.  He doesn't want to take it because of the way it makes him feel.  I wanted to know if there was something that was a little stronger than the hydrocodone that he was taking but not quite as strong as the oxycodone?"

## 2013-11-02 NOTE — Telephone Encounter (Signed)
Wife notified and verbalized understanding of instructions. 

## 2013-11-02 NOTE — Telephone Encounter (Signed)
Message copied by Mellissa Kohut on Mon Nov 02, 2013 10:30 AM ------      Message from: Farrel Gobble A      Created: Mon Nov 02, 2013  9:45 AM       Antony Haste to try taking half tablet of oxycodone 10. ------

## 2013-11-03 ENCOUNTER — Other Ambulatory Visit (HOSPITAL_COMMUNITY): Payer: Self-pay | Admitting: Hematology and Oncology

## 2013-11-03 ENCOUNTER — Telehealth (HOSPITAL_COMMUNITY): Payer: Self-pay

## 2013-11-03 LAB — SOLUBLE TRANSFERRIN RECEPTOR: TRANSFERRIN RECEPTOR, SOLUBLE: 0.8 mg/L (ref 0.76–1.76)

## 2013-11-03 MED ORDER — TRAMADOL HCL 50 MG PO TABS: ORAL_TABLET | ORAL | Status: AC

## 2013-11-03 NOTE — Telephone Encounter (Signed)
Call from wife stating Peter [name] tried taking 1/2 tablet of the oxycodone 10 mg and [name] is not able to tolerate.  Took 1/2 tablet yesterday and has not been able to get out of the bed since 4 pm yesterday.  Wants to know if there is something else he could try.

## 2013-11-16 ENCOUNTER — Emergency Department (HOSPITAL_COMMUNITY)
Admission: EM | Admit: 2013-11-16 | Discharge: 2013-11-16 | Disposition: A | Discharge status: Home / Self Care | Payer: Medicaid Other | Attending: Emergency Medicine | Admitting: Emergency Medicine

## 2013-11-16 ENCOUNTER — Encounter (HOSPITAL_COMMUNITY): Payer: Self-pay | Admitting: Emergency Medicine

## 2013-11-16 DIAGNOSIS — R5381 Other malaise: Secondary | ICD-10-CM | POA: Insufficient documentation

## 2013-11-16 DIAGNOSIS — D72819 Decreased white blood cell count, unspecified: Secondary | ICD-10-CM

## 2013-11-16 DIAGNOSIS — Z79899 Other long term (current) drug therapy: Secondary | ICD-10-CM | POA: Insufficient documentation

## 2013-11-16 DIAGNOSIS — E860 Dehydration: Secondary | ICD-10-CM

## 2013-11-16 DIAGNOSIS — Z9089 Acquired absence of other organs: Secondary | ICD-10-CM | POA: Diagnosis not present

## 2013-11-16 DIAGNOSIS — Z862 Personal history of diseases of the blood and blood-forming organs and certain disorders involving the immune mechanism: Secondary | ICD-10-CM | POA: Diagnosis not present

## 2013-11-16 DIAGNOSIS — Z85038 Personal history of other malignant neoplasm of large intestine: Secondary | ICD-10-CM | POA: Insufficient documentation

## 2013-11-16 DIAGNOSIS — Z933 Colostomy status: Secondary | ICD-10-CM | POA: Insufficient documentation

## 2013-11-16 DIAGNOSIS — R110 Nausea: Secondary | ICD-10-CM | POA: Insufficient documentation

## 2013-11-16 DIAGNOSIS — Z87448 Personal history of other diseases of urinary system: Secondary | ICD-10-CM | POA: Diagnosis not present

## 2013-11-16 DIAGNOSIS — R42 Dizziness and giddiness: Secondary | ICD-10-CM | POA: Diagnosis not present

## 2013-11-16 DIAGNOSIS — K7460 Unspecified cirrhosis of liver: Secondary | ICD-10-CM | POA: Insufficient documentation

## 2013-11-16 DIAGNOSIS — Z9889 Other specified postprocedural states: Secondary | ICD-10-CM | POA: Diagnosis not present

## 2013-11-16 DIAGNOSIS — R5383 Other fatigue: Secondary | ICD-10-CM | POA: Diagnosis not present

## 2013-11-16 DIAGNOSIS — K3184 Gastroparesis: Secondary | ICD-10-CM | POA: Insufficient documentation

## 2013-11-16 DIAGNOSIS — K219 Gastro-esophageal reflux disease without esophagitis: Secondary | ICD-10-CM | POA: Diagnosis not present

## 2013-11-16 LAB — URINALYSIS, ROUTINE W REFLEX MICROSCOPIC
Glucose, UA: 100 mg/dL — AB
Hgb urine dipstick: NEGATIVE
KETONES UR: 15 mg/dL — AB
Leukocytes, UA: NEGATIVE
Nitrite: POSITIVE — AB
SPECIFIC GRAVITY, URINE: 1.01 (ref 1.005–1.030)
pH: 6.5 (ref 5.0–8.0)

## 2013-11-16 LAB — CBC WITH DIFFERENTIAL/PLATELET
BASOS ABS: 0 10*3/uL (ref 0.0–0.1)
Basophils Relative: 0 % (ref 0–1)
EOS PCT: 1 % (ref 0–5)
Eosinophils Absolute: 0 10*3/uL (ref 0.0–0.7)
HCT: 29.9 % — ABNORMAL LOW (ref 39.0–52.0)
HEMOGLOBIN: 10.2 g/dL — AB (ref 13.0–17.0)
LYMPHS ABS: 0.6 10*3/uL — AB (ref 0.7–4.0)
LYMPHS PCT: 24 % (ref 12–46)
MCH: 35.5 pg — ABNORMAL HIGH (ref 26.0–34.0)
MCHC: 34.1 g/dL (ref 30.0–36.0)
MCV: 104.2 fL — ABNORMAL HIGH (ref 78.0–100.0)
MONOS PCT: 10 % (ref 3–12)
Monocytes Absolute: 0.2 10*3/uL (ref 0.1–1.0)
NEUTROS ABS: 1.5 10*3/uL — AB (ref 1.7–7.7)
Neutrophils Relative %: 65 % (ref 43–77)
Platelets: 45 10*3/uL — ABNORMAL LOW (ref 150–400)
RBC: 2.87 MIL/uL — AB (ref 4.22–5.81)
RDW: 14.9 % (ref 11.5–15.5)
Smear Review: DECREASED
WBC: 2.3 10*3/uL — AB (ref 4.0–10.5)

## 2013-11-16 LAB — COMPREHENSIVE METABOLIC PANEL
ALBUMIN: 2.9 g/dL — AB (ref 3.5–5.2)
ALK PHOS: 154 U/L — AB (ref 39–117)
ALT: 30 U/L (ref 0–53)
ANION GAP: 11 (ref 5–15)
AST: 87 U/L — ABNORMAL HIGH (ref 0–37)
BILIRUBIN TOTAL: 3.7 mg/dL — AB (ref 0.3–1.2)
BUN: 9 mg/dL (ref 6–23)
CHLORIDE: 94 meq/L — AB (ref 96–112)
CO2: 34 meq/L — AB (ref 19–32)
Calcium: 8.6 mg/dL (ref 8.4–10.5)
Creatinine, Ser: 1.08 mg/dL (ref 0.50–1.35)
GFR calc Af Amer: 84 mL/min — ABNORMAL LOW (ref >90)
GFR, EST NON AFRICAN AMERICAN: 72 mL/min — AB (ref >90)
GLUCOSE: 111 mg/dL — AB (ref 70–99)
POTASSIUM: 3.7 meq/L (ref 3.7–5.3)
Sodium: 139 mEq/L (ref 137–147)
Total Protein: 6.1 g/dL (ref 6.0–8.3)

## 2013-11-16 LAB — URINE MICROSCOPIC-ADD ON

## 2013-11-16 LAB — CBG MONITORING, ED: Glucose-Capillary: 104 mg/dL — ABNORMAL HIGH (ref 70–99)

## 2013-11-16 LAB — AMMONIA: Ammonia: 49 umol/L (ref 11–60)

## 2013-11-16 MED ORDER — SODIUM CHLORIDE 0.9 % IV BOLUS (SEPSIS)
500.0000 mL | Freq: Once | INTRAVENOUS | Status: AC
  Administered 2013-11-16: 500 mL via INTRAVENOUS

## 2013-11-16 MED ORDER — PROMETHAZINE HCL 25 MG PO TABS: 25.0000 mg | ORAL_TABLET | Freq: Four times a day (QID) | ORAL | Status: AC | PRN

## 2013-11-16 NOTE — Discharge Instructions (Signed)
Dehydration, Adult Dehydration means your body does not have as much fluid as it needs. Your kidneys, brain, and heart will not work properly without the right amount of fluids and salt.  HOME CARE  Ask your doctor how to replace body fluid losses (rehydrate).  Drink enough fluids to keep your pee (urine) clear or pale yellow.  Drink small amounts of fluids often if you feel sick to your stomach (nauseous) or throw up (vomit).  Eat like you normally do.  Avoid:  Foods or drinks high in sugar.  Bubbly (carbonated) drinks.  Juice.  Very hot or cold fluids.  Drinks with caffeine.  Fatty, greasy foods.  Alcohol.  Tobacco.  Eating too much.  Gelatin desserts.  Wash your hands to avoid spreading germs (bacteria, viruses).  Only take medicine as told by your doctor.  Keep all doctor visits as told. GET HELP RIGHT AWAY IF:   You cannot drink something without throwing up.  You get worse even with treatment.  Your vomit has blood in it or looks greenish.  Your poop (stool) has blood in it or looks black and tarry.  You have not peed in 6 to 8 hours.  You pee a small amount of very dark pee.  You have a fever.  You pass out (faint).  You have belly (abdominal) pain that gets worse or stays in one spot (localizes).  You have a rash, stiff neck, or bad headache.  You get easily annoyed, sleepy, or are hard to wake up.  You feel weak, dizzy, or very thirsty. MAKE SURE YOU:   Understand these instructions.  Will watch your condition.  Will get help right away if you are not doing well or get worse. Document Released: 12/02/2008 Document Revised: 04/30/2011 Document Reviewed: 09/25/2010 [facility] Patient Information 2015 [place], [place]. This information is not intended to replace advice given to you by your health care provider. Make sure you discuss any questions you have with your health care provider.   Your caregiver has seen you today because you are  having problems with feelings of weakness, dizziness, and/or fatigue. Weakness has many different causes, some of which are common and others are very rare. Your caregiver has considered some of the most common causes of weakness and feels it is safe for you to go home and be observed. Not every illness or injury can be identified during an emergency department visit, thus follow-up with your primary healthcare provider is important. Medical conditions can also worsen, so it is also important to return immediately as directed below, or if you have other serious concerns develop.  RETURN IMMEDIATELY IF  you develop new shortness of breath, chest pain, fever, have difficulty moving parts of your body (new weakness, numbness, or incoordination), sudden change in speech, vision, swallowing, or understanding, faint or develop new dizziness, severe headache, become poorly responsive or have an altered mental status compared to baseline for you, new rash, abdominal pain, or bloody stools,  Return sooner also if you develop new problems for which you have not talked to your caregiver but you feel may be emergency medical conditions.

## 2013-11-16 NOTE — ED Notes (Signed)
C/o persistent nausea and decreased appetite for appox 1 week

## 2013-11-16 NOTE — ED Provider Notes (Signed)
CSN: [id number]     Arrival date & time 11/16/13  1004 History  This chart was scribed for Sharyon Cable, MD by Molli Posey, ED Scribe. This patient was seen in room APA01/APA01 and the patient's care was started 10:35 AM.    Chief Complaint  Patient presents with  . Nausea    Patient is a 63 y.o. male presenting with general illness. The history is provided by the patient and the spouse. No language interpreter was used.  Illness Severity:  Moderate Onset quality:  Gradual Duration:  1 week Timing:  Constant Progression:  Worsening Associated symptoms: abdominal pain and nausea   Associated symptoms: no chest pain, no diarrhea, no headaches, no loss of consciousness, no shortness of breath and no vomiting    HPI Comments: Peter [name] is a 63 y.o. male who presents to the Emergency Department complaining of constant nausea and generalized weakness for the past week. His spouse reports he has not been eating or drinking well and has associated dizziness that worsened this morning. She reports he has chronic abdominal pain. She reports a PMHx of colon CA 2 years ago, and cirrhosis of liver. She states he does not have a history of MI or CVA. She denies syncope, CP, SOB, dysuria, diarrhea, vomiting or HA.   PCP [place]    Past Medical History  Diagnosis Date  . Gout   . GERD (gastroesophageal reflux disease)   . Cirrhosis     ?ETOH related, afp 02/05/12= 4.9, ct on 10/16/11,no  Hep A/B vaccines  . Colon cancer Dec 2011    [place]  . ARF (acute renal failure)     hospitalization Jan 2012  . H/O ETOH abuse   . 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 S/P endoscopy Dec 2011    Schatzki's ring, Grade 1 esophageal varices, antral/body erosions  . Anemia of chronic disease 08/10/2010  . Renal insufficiency 10/24/2010  . Schatzki's ring   . Varices, esophageal   . Gastroparesis 01/2013   Past Surgical History  Procedure Laterality  Date  . Colon surgery  02/24/2010    colon cancer (cecum)  . Exploratory laparotomy  03/03/2010    anastomotic leak, developed EC fistula  . Exploratory laparotomy w/ bowel resection  04/18/2010    ileostomy placed, (hx of EC fistula, anastomotic leak). About two feet of ileum removed.  . Colostomy    . Percutaneous drainage of intraabdominal abscess  07/2010 and 09/2010  . Partial colectomy  10/30/2010    Procedure: PARTIAL COLECTOMY;  Surgeon: Jamesetta So;  Location: [facility];  Service: General;  Laterality: N/A;  . Ileostomy closure  10/30/2010    Procedure: ILEOSTOMY TAKEDOWN;  Surgeon: Jamesetta So;  Location: [facility];  Service: General;  Laterality: N/A;  . Colonoscopy  Dec 2011    cecal mass, tubulovillous adenoma at splenic flexure  . Esophagogastroduodenoscopy  Dec 2011    Schatzki's ring, Grade 1 esophageal varices, antral/body erosions  . Colonoscopy N/A 04/02/2012    IRC:VELFYB residual rectal and colonic mucosa. Next colonoscopy in 03/2015.  Marland Kitchen Esophagogastroduodenoscopy N/A 08/06/2012    RMR: Grade 1 esophageal varices. Abnormal distal esophageal mucosa-3 short segment Barrett's-like appearance but negative biopsy for barrett's. mild erosive reflux esophagitis, non-critical Schatzki's ring, hiatal hernia, portal gastropathy.   . Esophageal banding N/A 08/06/2012    no banding   . Wound exploration N/A 02/25/2013    Procedure: EXCISION OF SUTURE  GRANULOMA ABDOMINAL WALL;  Surgeon: Jamesetta So, MD;  Location: [facility];  Service: General;  Laterality: N/A;  . Prostate biopsy  08/14/13   Family History  Problem Relation Age of Onset  . Cirrhosis Father     deceased, secondary to ETOH   History  Substance Use Topics  . Smoking status: Never Smoker   . Smokeless tobacco: Current User    Types: Chew     Comment: Never smoked/ chews tobacco some  . Alcohol Use: Yes     Comment: continues to drink frequent but patient will not quantify    Review of Systems  Respiratory: Negative  for shortness of breath.   Cardiovascular: Negative for chest pain.  Gastrointestinal: Positive for nausea and abdominal pain. Negative for vomiting and diarrhea.  Genitourinary: Negative for dysuria.  Neurological: Negative for loss of consciousness, syncope and headaches.  All other systems reviewed and are negative.     Allergies  Ace inhibitors; Lorazepam; and Percocet  Home Medications   Prior to Admission medications   Medication Sig Start Date End Date Taking? Authorizing Provider  ALPRAZolam (XANAX) 0.25 MG tablet Take 0.25 mg by mouth 3 (three) times daily as needed for anxiety.    Historical Provider, MD  dexlansoprazole (DEXILANT) 60 MG capsule Take 1 capsule (60 mg total) by mouth daily. 04/23/13   Orvil Feil, NP  diphenoxylate-atropine (LOMOTIL) 2.5-0.025 MG per tablet Take 1 tablet by mouth 3 (three) times daily as needed for diarrhea or loose stools. 07/24/12   Mahala Menghini, PA-C  esomeprazole (NEXIUM) 40 MG capsule Take 1 capsule (40 mg total) by mouth 2 (two) times daily before a meal. 02/02/13   Orvil Feil, NP  HYDROcodone-acetaminophen (NORCO) 5-325 MG per tablet Take 1 tablet by mouth every 4 (four) hours as needed for moderate pain. 02/25/13   Jamesetta So, MD  metoCLOPramide (REGLAN) 10 MG tablet Take 1 tablet (10 mg total) by mouth 2 (two) times daily. With breakfast and dinner. 02/09/13   Orvil Feil, NP  MILK THISTLE PO Take 240 mg by mouth daily.    Historical Provider, MD  nadolol (CORGARD) 20 MG tablet Take 2 tablets (40 mg total) by mouth daily. 02/02/13   Orvil Feil, NP  ondansetron (ZOFRAN) 4 MG tablet Take 1 tablet (4 mg total) by mouth every 8 (eight) hours as needed for nausea or vomiting. 02/02/13   Orvil Feil, NP  Oxycodone HCl (ROXICODONE) 10 MG TABS Take 1 or 2 tablets every 4 hours to control pain. 10/30/13   Farrel Gobble, MD  Pancrelipase, Lip-Prot-Amyl, (CREON) 24000 UNITS CPEP Take 1 capsule (24,000 Units total) by mouth 3 (three) times daily  with meals. 10/05/13   Farrel Gobble, MD  traMADol Veatrice Bourbon) 50 MG tablet Take 1 or 2 tablets up to every 6 hours as needed for pain 11/03/13   Farrel Gobble, MD   BP 97/60  Pulse 112  Temp(Src) 98.2 F (36.8 C) (Oral)  Resp 14  SpO2 96%  Physical Exam  Nursing note and vitals reviewed. CONSTITUTIONAL: Disheveled, smells ketotic HEAD: Normocephalic/atraumatic EYES: EOMI/PERRL ENMT: Mucous membranes dry NECK: supple no meningeal signs SPINE:entire spine nontender CV: S1/S2 noted LUNGS: Lungs are clear to auscultation bilaterally, no apparent distress ABDOMEN: soft, nontender, no rebound or guarding GU:no cva tenderness NEURO: Pt is awake/alert, moves all extremitiesx4, no arm or leg drift, no facial droop, no past pointing, no ataxia  EXTREMITIES: pulses normal, full ROM SKIN: warm, color normal  PSYCH: no abnormalities of mood noted   ED Course  Procedures  DIAGNOSTIC STUDIES: Oxygen Saturation is 96% on RA, normal by my interpretation.    COORDINATION OF CARE: 10:40 AM Discussed treatment plan with pt at bedside and pt agreed to plan. Administer IV fluids.    Pt improved with IV fluids He had no focal neuro deficits - he could ambulate He had no signs of acute abdominal emergency He denied CP Suspect this from worsening cirrhosis and labs are worse when compared to prior He feels well enough for d/c home Discussed need for close outpatient followup Will stop zofran and advise to phenergen (pt has mildly prolonged QT) Urine culture pending (denied dysuria, ?uti on labs) BP 124/81  Pulse 85  Temp(Src) 98.2 F (36.8 C) (Oral)  Resp 13  SpO2 97%   Labs Review Labs Reviewed  CBC WITH DIFFERENTIAL - Abnormal; Notable for the following:    WBC 2.3 (*)    RBC 2.87 (*)    Hemoglobin 10.2 (*)    HCT 29.9 (*)    MCV 104.2 (*)    MCH 35.5 (*)    Platelets 45 (*)    Neutro Abs 1.5 (*)    Lymphs Abs 0.6 (*)    All other components within normal limits   COMPREHENSIVE METABOLIC PANEL - Abnormal; Notable for the following:    Chloride 94 (*)    CO2 34 (*)    Glucose, Bld 111 (*)    Albumin 2.9 (*)    AST 87 (*)    Alkaline Phosphatase 154 (*)    Total Bilirubin 3.7 (*)    GFR calc non Af Amer 72 (*)    GFR calc Af Amer 84 (*)    All other components within normal limits  URINALYSIS, ROUTINE W REFLEX MICROSCOPIC - Abnormal; Notable for the following:    Color, Urine RED (*)    Glucose, UA 100 (*)    Bilirubin Urine LARGE (*)    Ketones, ur 15 (*)    Protein, ur TRACE (*)    Urobilinogen, UA >8.0 (*)    Nitrite POSITIVE (*)    All other components within normal limits  URINE MICROSCOPIC-ADD ON - Abnormal; Notable for the following:    Bacteria, UA FEW (*)    All other components within normal limits  CBG MONITORING, ED - Abnormal; Notable for the following:    Glucose-Capillary 104 (*)    All other components within normal limits  URINE CULTURE  AMMONIA      EKG Interpretation   Date/Time:  Monday November 16 2013 10:57:38 EDT Ventricular Rate:  93 PR Interval:  152 QRS Duration: 94 QT Interval:  408 QTC Calculation: 507 R Axis:   40 Text Interpretation:  Normal sinus rhythm Prolonged QT Abnormal ECG  Confirmed by Christy Gentles  MD, Elenore Rota ([id number]) on 11/16/2013 11:07:34 AM      MDM   Final diagnoses:  Dehydration  Hepatic cirrhosis, unspecified hepatic cirrhosis type  Leukopenia    Nursing notes including past medical history and social history reviewed and considered in documentation Labs/vital reviewed and considered   I personally performed the services described in this documentation, which was scribed in my presence. The recorded information has been reviewed and is accurate.      Sharyon Cable, MD 11/16/13 [id number]

## 2013-11-16 NOTE — ED Notes (Signed)
Weak for the last 7 days.  Not eating or drinking any fluids/

## 2013-11-17 LAB — URINE CULTURE
Colony Count: NO GROWTH
Culture: NO GROWTH

## 2013-11-20 ENCOUNTER — Other Ambulatory Visit (HOSPITAL_COMMUNITY): Payer: Self-pay | Admitting: Oncology

## 2013-11-20 ENCOUNTER — Telehealth (HOSPITAL_COMMUNITY): Payer: Self-pay

## 2013-11-20 ENCOUNTER — Other Ambulatory Visit (HOSPITAL_COMMUNITY): Payer: Self-pay

## 2013-11-20 DIAGNOSIS — C61 Malignant neoplasm of prostate: Secondary | ICD-10-CM

## 2013-11-20 DIAGNOSIS — R630 Anorexia: Secondary | ICD-10-CM

## 2013-11-20 DIAGNOSIS — C189 Malignant neoplasm of colon, unspecified: Secondary | ICD-10-CM

## 2013-11-20 DIAGNOSIS — K7030 Alcoholic cirrhosis of liver without ascites: Secondary | ICD-10-CM

## 2013-11-20 DIAGNOSIS — R634 Abnormal weight loss: Secondary | ICD-10-CM

## 2013-11-20 MED ORDER — MEGESTROL ACETATE 400 MG/10ML PO SUSP: 800.0000 mg | Freq: Every day | ORAL | Status: AC

## 2013-11-20 NOTE — Telephone Encounter (Signed)
Call from wife stating "Dianne is still not eating.  Doesn't have an appetite.  Is there anything that he can take for his appetite?"

## 2013-11-20 NOTE — Telephone Encounter (Signed)
Message sent to Laban Emperor, NP (GI) regarding bilirubin elevation at 3.7 when in ED on 9/28 compared to 1.7 in March 2015.  Not sure if GI needs to see patient with this change given his diagnosis of cirrhosis.  GI office closed at this time.  I do not see an absolute contraindication to [name] therapy for appetite stimulation.  Will prescribe 800 mg of [name] daily.  Medication escribed to pharmacy.  He should come in to the clinic for a TSH next week to rule out thyroid disease as a cause of his complaints.  Will defer GI work-up to GI if indicated.  KEFALAS,THOMAS 11/20/2013

## 2013-11-20 NOTE — Telephone Encounter (Signed)
Baird Cancer, PA-C at 11/20/2013 2:16 PM     Status: Signed        Message sent to Laban Emperor, NP (GI) regarding bilirubin elevation at 3.7 when in ED on 9/28 compared to 1.7 in March 2015. Not sure if GI needs to see patient with this change given his diagnosis of cirrhosis. GI office closed at this time. I do not see an absolute contraindication to [name] therapy for appetite stimulation. Will prescribe 800 mg of [name] daily. Medication escribed to pharmacy. He should come in to the clinic for a TSH next week to rule out thyroid disease as a cause of his complaints. Will defer GI work-up to GI if indicated.  KEFALAS,THOMAS  11/20/2013    Wife notified regarding message to Laban Emperor, NP and that prescription for megace was e-scribed to their pharmacy.

## 2013-11-23 NOTE — Telephone Encounter (Signed)
Need to have patient return in follow-up to see me.

## 2013-11-24 ENCOUNTER — Encounter: Payer: Self-pay | Admitting: Internal Medicine

## 2013-11-24 NOTE — Telephone Encounter (Signed)
APPT MADE AND LETTER SENT  °

## 2013-11-25 ENCOUNTER — Encounter (HOSPITAL_COMMUNITY): Payer: Medicaid Other | Attending: Hematology and Oncology

## 2013-11-25 ENCOUNTER — Telehealth: Payer: Self-pay | Admitting: Internal Medicine

## 2013-11-25 DIAGNOSIS — R630 Anorexia: Secondary | ICD-10-CM

## 2013-11-25 DIAGNOSIS — R634 Abnormal weight loss: Secondary | ICD-10-CM

## 2013-11-25 DIAGNOSIS — D731 Hypersplenism: Secondary | ICD-10-CM | POA: Diagnosis present

## 2013-11-25 DIAGNOSIS — K7460 Unspecified cirrhosis of liver: Secondary | ICD-10-CM | POA: Insufficient documentation

## 2013-11-25 DIAGNOSIS — Z85038 Personal history of other malignant neoplasm of large intestine: Secondary | ICD-10-CM

## 2013-11-25 LAB — TSH: TSH: 3.76 u[IU]/mL (ref 0.350–4.500)

## 2013-11-25 NOTE — Telephone Encounter (Signed)
Pt's wife LMOM that patient is sick and needs to be seen today or ASAP. No other info given. Please advise if he needs an URG visit. [phone]

## 2013-11-25 NOTE — Progress Notes (Signed)
Labs drawn for tsh

## 2013-11-25 NOTE — Telephone Encounter (Signed)
Let's see how it goes with [name] and supportive care. Have him do boost or ensure on days he doesn't feel like eating. Otherwise, needs to follow the gastroparesis diet. HISTORY OF ETOH ABUSE. Is he drinking? Needs to stop. Progress report next week. If no better, can be seen in an urgent.

## 2013-11-25 NOTE — Telephone Encounter (Signed)
Spoke with the pts wife- pt has not had an appetite for about 3 weeks. He told her that the smell of food makes him nauseous and he is getting full quickly when he does eat. He is feeling weak, but no vomiting. Yesterday all he ate all day was one small cheeseburger. Cancer center is checking his thyroid and they put him on megace 5 days ago. He is still taking his nexium and is taking something for nausea. He has had a recent ct and mri at [facility]. Pt is scheduled to come in 12/24/13 and see AS. Do you feel like we can get him in sooner or do you have any recommendations for him until he can come in.

## 2013-11-25 NOTE — Telephone Encounter (Signed)
pts wife is aware. No alcohol that she is aware of.

## 2013-11-26 ENCOUNTER — Telehealth: Payer: Self-pay | Admitting: Internal Medicine

## 2013-11-26 NOTE — Telephone Encounter (Signed)
PLEASE CALL [facility] REGARDING PRESCRIPTION.  PRIOR AUTH NEEDED. [place] [phone]

## 2013-11-26 NOTE — Telephone Encounter (Signed)
Spoke with [facility], PA is for Nexium. They will send over PA request.

## 2013-12-09 NOTE — Telephone Encounter (Signed)
PA for nexium 40mg  bid has been approved by [facility] medicaid. Approval number is [id number]. All approval paperwork has been faxed to [place].

## 2013-12-16 ENCOUNTER — Telehealth: Payer: Self-pay | Admitting: Internal Medicine

## 2013-12-16 ENCOUNTER — Other Ambulatory Visit: Payer: Self-pay

## 2013-12-16 DIAGNOSIS — R197 Diarrhea, unspecified: Secondary | ICD-10-CM

## 2013-12-16 NOTE — Telephone Encounter (Signed)
Wife is going to come by to pick up cup for C-diff

## 2013-12-16 NOTE — Telephone Encounter (Signed)
Pt has been having diarrhea every couple of hours for the past week. He is taking lomotil, but its not helping. He has not been on any antibiotics recently.  He is eating ok now, since starting the megace. I advised her to have him do clear liquids for now and to advance to full liquids as tolerated until she hears back from me. Pt has an ov with AS on 12/24/13.   Peter [name], is there anything else he can try until his ov?

## 2013-12-16 NOTE — Telephone Encounter (Signed)
Patient's wife called to ask if we could call something else [facility] for patient's diarrhea. What he has been taking isn't working for him. He has OV coming up next week with [place]. Please advise.

## 2013-12-16 NOTE — Telephone Encounter (Signed)
Lab order done and container is at the front desk for them to pick up.

## 2013-12-16 NOTE — Telephone Encounter (Signed)
Check Cdiff.

## 2013-12-16 NOTE — Addendum Note (Signed)
Addended by: Claudina Lick on: 12/16/2013 02:32 PM   Modules accepted: Orders

## 2013-12-18 LAB — CLOSTRIDIUM DIFFICILE BY PCR: Toxigenic C. Difficile by PCR: DETECTED — CR

## 2013-12-21 ENCOUNTER — Telehealth: Payer: Self-pay | Admitting: Gastroenterology

## 2013-12-21 ENCOUNTER — Telehealth: Payer: Self-pay

## 2013-12-21 MED ORDER — METRONIDAZOLE 500 MG PO TABS: 500.0000 mg | ORAL_TABLET | Freq: Three times a day (TID) | ORAL | Status: DC

## 2013-12-21 NOTE — Progress Notes (Signed)
Quick Note:  Per Ginger, she reviewed the guidelines with pt's wife and I am putting the instructions in the mail. ______

## 2013-12-21 NOTE — Telephone Encounter (Signed)
Please see result note. I sent to [facility] I believe.

## 2013-12-21 NOTE — Telephone Encounter (Signed)
Positive Cdiff.  Flagyl sent to pharmacy.

## 2013-12-21 NOTE — Telephone Encounter (Signed)
Talked with patients wife and she is aware

## 2013-12-21 NOTE — Telephone Encounter (Signed)
The lab called this morning to inform us that his C-Diff was positive.

## 2013-12-21 NOTE — Progress Notes (Signed)
Quick Note:  Positive Cdiff.  I have sent in Flagyl TID X 14 days.  Stop PPI.  Start a probiotic daily Follow-up in 2 weeks Please review sanitary guidelines and send Cdiff instructions in mail. ______

## 2013-12-24 ENCOUNTER — Ambulatory Visit: Payer: Medicaid Other | Admitting: Gastroenterology

## 2013-12-29 MED ORDER — VANCOMYCIN HCL 125 MG PO CAPS: 125.0000 mg | ORAL_CAPSULE | Freq: Four times a day (QID) | ORAL | Status: DC

## 2013-12-29 NOTE — Telephone Encounter (Signed)
As he is failing flagyl, let's do vancomycin. STOP FLAGYL.   Start Vancomycin 125 mg oral four times a day for 10 days.   How many loose stools is he having? Need to make sure he is not having profuse diarrhea or becoming dehydrated. If he is, then we need to check a CMP.

## 2013-12-29 NOTE — Addendum Note (Signed)
Addended by: Orvil Feil on: 12/29/2013 11:50 AM   Modules accepted: Orders

## 2013-12-29 NOTE — Telephone Encounter (Signed)
Patient's wife called and stated he isn't feeling any better while taking his Abx.  He is not taking his Abx consistently because he stomach starts hurting and swelling.  He still has diarrhea and she was wondering if there was an alternative, like admitting him to the hospital or having home health to come out and administer IV Abx.

## 2013-12-29 NOTE — Telephone Encounter (Signed)
I spoke with Tamela Oddi and she will contact the patient

## 2013-12-29 NOTE — Telephone Encounter (Signed)
I spoke to pt's wife and informed her. ( [facility] and [facility] will not have the Vanco til this afternoon or tomorrow.)   Peter [name] said he had about 4 episodes of diarrhea last night and 3 today so far. There is not a lot coming out, but it is just very watery and she said he is very weak.  She said she would let him continue the Flagyl today since pharmacist told them it will be tomorrow before he can start the Vanco and she will call if he worsens.

## 2014-01-05 ENCOUNTER — Emergency Department (HOSPITAL_COMMUNITY)
Admission: EM | Admit: 2014-01-05 | Discharge: 2014-01-05 | Disposition: A | Discharge status: Home / Self Care | Payer: Medicaid Other | Attending: Emergency Medicine | Admitting: Emergency Medicine

## 2014-01-05 ENCOUNTER — Encounter (HOSPITAL_COMMUNITY): Payer: Self-pay | Admitting: Emergency Medicine

## 2014-01-05 DIAGNOSIS — Z85038 Personal history of other malignant neoplasm of large intestine: Secondary | ICD-10-CM | POA: Diagnosis not present

## 2014-01-05 DIAGNOSIS — K219 Gastro-esophageal reflux disease without esophagitis: Secondary | ICD-10-CM | POA: Insufficient documentation

## 2014-01-05 DIAGNOSIS — Z792 Long term (current) use of antibiotics: Secondary | ICD-10-CM | POA: Insufficient documentation

## 2014-01-05 DIAGNOSIS — R109 Unspecified abdominal pain: Secondary | ICD-10-CM | POA: Diagnosis present

## 2014-01-05 DIAGNOSIS — D649 Anemia, unspecified: Secondary | ICD-10-CM | POA: Insufficient documentation

## 2014-01-05 DIAGNOSIS — Z87448 Personal history of other diseases of urinary system: Secondary | ICD-10-CM | POA: Insufficient documentation

## 2014-01-05 DIAGNOSIS — Z79899 Other long term (current) drug therapy: Secondary | ICD-10-CM | POA: Diagnosis not present

## 2014-01-05 DIAGNOSIS — Z8739 Personal history of other diseases of the musculoskeletal system and connective tissue: Secondary | ICD-10-CM | POA: Diagnosis not present

## 2014-01-05 DIAGNOSIS — Q394 Esophageal web: Secondary | ICD-10-CM | POA: Insufficient documentation

## 2014-01-05 DIAGNOSIS — Z9889 Other specified postprocedural states: Secondary | ICD-10-CM | POA: Insufficient documentation

## 2014-01-05 DIAGNOSIS — Z8679 Personal history of other diseases of the circulatory system: Secondary | ICD-10-CM | POA: Insufficient documentation

## 2014-01-05 DIAGNOSIS — K7460 Unspecified cirrhosis of liver: Secondary | ICD-10-CM | POA: Insufficient documentation

## 2014-01-05 LAB — COMPREHENSIVE METABOLIC PANEL
ALBUMIN: 2.6 g/dL — AB (ref 3.5–5.2)
ALT: 11 U/L (ref 0–53)
ANION GAP: 15 (ref 5–15)
AST: 45 U/L — ABNORMAL HIGH (ref 0–37)
Alkaline Phosphatase: 119 U/L — ABNORMAL HIGH (ref 39–117)
BUN: 7 mg/dL (ref 6–23)
CO2: 23 mEq/L (ref 19–32)
Calcium: 8.1 mg/dL — ABNORMAL LOW (ref 8.4–10.5)
Chloride: 101 mEq/L (ref 96–112)
Creatinine, Ser: 0.98 mg/dL (ref 0.50–1.35)
GFR calc Af Amer: >90 mL/min (ref >90)
GFR calc non Af Amer: 87 mL/min — ABNORMAL LOW (ref >90)
Glucose, Bld: 97 mg/dL (ref 70–99)
POTASSIUM: 3.1 meq/L — AB (ref 3.7–5.3)
SODIUM: 139 meq/L (ref 137–147)
TOTAL PROTEIN: 6.3 g/dL (ref 6.0–8.3)
Total Bilirubin: 3 mg/dL — ABNORMAL HIGH (ref 0.3–1.2)

## 2014-01-05 LAB — CBC WITH DIFFERENTIAL/PLATELET
BASOS PCT: 0 % (ref 0–1)
Basophils Absolute: 0 10*3/uL (ref 0.0–0.1)
Eosinophils Absolute: 0.1 10*3/uL (ref 0.0–0.7)
Eosinophils Relative: 1 % (ref 0–5)
HEMATOCRIT: 28.4 % — AB (ref 39.0–52.0)
HEMOGLOBIN: 9.9 g/dL — AB (ref 13.0–17.0)
LYMPHS ABS: 1.3 10*3/uL (ref 0.7–4.0)
Lymphocytes Relative: 22 % (ref 12–46)
MCH: 35.6 pg — ABNORMAL HIGH (ref 26.0–34.0)
MCHC: 34.9 g/dL (ref 30.0–36.0)
MCV: 102.2 fL — ABNORMAL HIGH (ref 78.0–100.0)
MONO ABS: 0.5 10*3/uL (ref 0.1–1.0)
MONOS PCT: 8 % (ref 3–12)
NEUTROS ABS: 4.1 10*3/uL (ref 1.7–7.7)
Neutrophils Relative %: 69 % (ref 43–77)
Platelets: 121 10*3/uL — ABNORMAL LOW (ref 150–400)
RBC: 2.78 MIL/uL — ABNORMAL LOW (ref 4.22–5.81)
RDW: 15.3 % (ref 11.5–15.5)
WBC: 6 10*3/uL (ref 4.0–10.5)

## 2014-01-05 LAB — PROTIME-INR
INR: 2.16 — ABNORMAL HIGH (ref 0.00–1.49)
Prothrombin Time: 24.2 seconds — ABNORMAL HIGH (ref 11.6–15.2)

## 2014-01-05 NOTE — ED Notes (Signed)
PT had colon cancer surgery with midline incision 2 years ago and since the surgery pt has area that opens up and bleeds moderately. PT has been seen and had curarization of area by Dr. Arnoldo Morale approx a month ago. PT reports bleeding increasing to 3-4 a week. PT has nodule with pain at site. Bleeding controlled at this time with drsg from home present.

## 2014-01-05 NOTE — Discharge Instructions (Signed)
Anemia, Nonspecific Anemia is a condition in which the concentration of red blood cells or hemoglobin in the blood is below normal. Hemoglobin is a substance in red blood cells that carries oxygen to the tissues of the body. Anemia results in not enough oxygen reaching these tissues.  CAUSES  Common causes of anemia include:   Excessive bleeding. Bleeding may be internal or external. This includes excessive bleeding from periods (in women) or from the intestine.   Poor nutrition.   Chronic kidney, thyroid, and liver disease.  Bone marrow disorders that decrease red blood cell production.  Cancer and treatments for cancer.  HIV, AIDS, and their treatments.  Spleen problems that increase red blood cell destruction.  Blood disorders.  Excess destruction of red blood cells due to infection, medicines, and autoimmune disorders. SIGNS AND SYMPTOMS   Minor weakness.   Dizziness.   Headache.  Palpitations.   Shortness of breath, especially with exercise.   Paleness.  Cold sensitivity.  Indigestion.  Nausea.  Difficulty sleeping.  Difficulty concentrating. Symptoms may occur suddenly or they may develop slowly.  DIAGNOSIS  Additional blood tests are often needed. These help your health care provider determine the best treatment. Your health care provider will check your stool for blood and look for other causes of blood loss.  TREATMENT  Treatment varies depending on the cause of the anemia. Treatment can include:   Supplements of iron, vitamin O27, or folic acid.   Hormone medicines.   A blood transfusion. This may be needed if blood loss is severe.   Hospitalization. This may be needed if there is significant continual blood loss.   Dietary changes.  Spleen removal. HOME CARE INSTRUCTIONS Keep all follow-up appointments. It often takes many weeks to correct anemia, and having your health care provider check on your condition and your response to  treatment is very important. SEEK IMMEDIATE MEDICAL CARE IF:   You develop extreme weakness, shortness of breath, or chest pain.   You become dizzy or have trouble concentrating.  You develop heavy vaginal bleeding.   You develop a rash.   You have bloody or black, tarry stools.   You faint.   You vomit up blood.   You vomit repeatedly.   You have abdominal pain.  You have a fever or persistent symptoms for more than 2-3 days.   You have a fever and your symptoms suddenly get worse.   You are dehydrated.  MAKE SURE YOU:  Understand these instructions.  Will watch your condition.  Will get help right away if you are not doing well or get worse. Document Released: 03/15/2004 Document Revised: 10/08/2012 Document Reviewed: 08/01/2012 [facility] Patient Information 2015 [place], [place]. This information is not intended to replace advice given to you by your health care provider. Make sure you discuss any questions you have with your health care provider.  Cirrhosis Cirrhosis is a condition of scarring of the liver which is caused when the liver has tried repairing itself following damage. This damage may come from a previous infection such as one of the forms of hepatitis (usually hepatitis C), or the damage may come from being injured by toxins. The main toxin that causes this damage is alcohol. The scarring of the liver from use of alcohol is irreversible. That means the liver cannot return to normal even though alcohol is not used any more. The main danger of hepatitis C infection is that it may cause long-lasting (chronic) liver disease, and this also may lead  to cirrhosis. This complication is progressive and irreversible. CAUSES  Prior to available blood tests, hepatitis C could be contracted by blood transfusions. Since testing of blood has improved, this is now unlikely. This infection can also be contracted through intravenous drug use and the sharing of needles.  It can also be contracted through sexual relationships. The injury caused by alcohol comes from too much use. It is not a few drinks that poison the liver, but years of misuse. Usually there will be some signs and symptoms early with scarring of the liver that suggest the development of better habits. Alcohol should never be used while using acetaminophen. A small dose of both taken together may cause irreversible damage to the liver. HOME CARE INSTRUCTIONS  There is no specific treatment for cirrhosis. However, there are things you can do to avoid making the condition worse.  Rest as needed.  Eat a well-balanced diet. Your caregiver can help you with suggestions.  Vitamin supplements including vitamins A, K, D, and thiamine can help.  A low-salt diet, water restriction, or diuretic medicine may be needed to reduce fluid retention.  Avoid alcohol. This can be extremely toxic if combined with acetaminophen.  Avoid drugs which are toxic to the liver. Some of these include isoniazid, methyldopa, acetaminophen, anabolic steroids (muscle-building drugs), erythromycin, and oral contraceptives (birth control pills). Check with your caregiver to make sure medicines you are presently taking will not be harmful.  Periodic blood tests may be required. Follow your caregiver's advice regarding the timing of these.  Milk thistle is an herbal remedy which does protect the liver against toxins. However, it will not help once the liver has been scarred. SEEK MEDICAL CARE IF:  You have increasing fatigue or weakness.  You develop swelling of the hands, feet, legs, or face.  You vomit bright red blood, or a coffee ground appearing material.  You have blood in your stools, or the stools turn black and tarry.  You have a fever.  You develop loss of appetite, or have nausea and vomiting.  You develop jaundice.  You develop easy bruising or bleeding.  You have worsening of any of the problems you are  concerned about. Document Released: 02/05/2005 Document Revised: 04/30/2011 Document Reviewed: 09/24/2007 [facility] Patient Information 2015 [place], [place]. This information is not intended to replace advice given to you by your health care provider. Make sure you discuss any questions you have with your health care provider.

## 2014-01-05 NOTE — ED Provider Notes (Signed)
CSN: [id number]     Arrival date & time 01/05/14  1451 History  This chart was scribed for Dorie Rank, MD by Lowella Petties, ED Scribe. The patient was seen in room APA04/APA04. Patient's care was started at 5:19 PM.    Chief Complaint  Patient presents with  . Abdominal Pain   The history is provided by the patient and the spouse. No language interpreter was used.   HPI Comments: Peter [name] is a 63 y.o. male with a history of cirrhosis and colon cancer who presents to the Emergency Department complaining of abdominal distension and severe, intermittent, bleeding from a nodule on an old surgical scar on his abdomen 3 or 4 times in the past 7 days. Pt reports a painful nodule at the site of the bleeding. He additionally reports a reduced appetite and eating only 1 cup of jello per day. He reports that he saw Dr. Arnoldo Morale one month ago for cauterization of his abdominal surgical scar with minimal relief. He states that he has regular scans to check for cancer recurrence.   Past Medical History  Diagnosis Date  . Gout   . GERD (gastroesophageal reflux disease)   . Cirrhosis     ?ETOH related, afp 02/05/12= 4.9, ct on 10/16/11,no  Hep A/B vaccines  . Colon cancer Dec 2011    Cecum  . ARF (acute renal failure)     hospitalization Jan 2012  . H/O ETOH abuse   . 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 S/P endoscopy Dec 2011    Schatzki's ring, Grade 1 esophageal varices, antral/body erosions  . Anemia of chronic disease 08/10/2010  . Renal insufficiency 10/24/2010  . Schatzki's ring   . Varices, esophageal   . Gastroparesis 01/2013   Past Surgical History  Procedure Laterality Date  . Colon surgery  02/24/2010    colon cancer (cecum)  . Exploratory laparotomy  03/03/2010    anastomotic leak, developed EC fistula  . Exploratory laparotomy w/ bowel resection  04/18/2010    ileostomy placed, (hx of EC fistula, anastomotic leak). About two feet of  ileum removed.  . Colostomy    . Percutaneous drainage of intraabdominal abscess  07/2010 and 09/2010  . Partial colectomy  10/30/2010    Procedure: PARTIAL COLECTOMY;  Surgeon: Jamesetta So;  Location: [facility];  Service: General;  Laterality: N/A;  . Ileostomy closure  10/30/2010    Procedure: ILEOSTOMY TAKEDOWN;  Surgeon: Jamesetta So;  Location: [facility];  Service: General;  Laterality: N/A;  . Colonoscopy  Dec 2011    cecal mass, tubulovillous adenoma at splenic flexure  . Esophagogastroduodenoscopy  Dec 2011    Schatzki's ring, Grade 1 esophageal varices, antral/body erosions  . Colonoscopy N/A 04/02/2012    HWE:XHBZJI residual rectal and colonic mucosa. Next colonoscopy in 03/2015.  Marland Kitchen Esophagogastroduodenoscopy N/A 08/06/2012    RMR: Grade 1 esophageal varices. Abnormal distal esophageal mucosa-3 short segment Barrett's-like appearance but negative biopsy for barrett's. mild erosive reflux esophagitis, non-critical Schatzki's ring, hiatal hernia, portal gastropathy.   . Esophageal banding N/A 08/06/2012    no banding   . Wound exploration N/A 02/25/2013    Procedure: EXCISION OF SUTURE GRANULOMA ABDOMINAL WALL;  Surgeon: Jamesetta So, MD;  Location: [facility];  Service: General;  Laterality: N/A;  . Prostate biopsy  08/14/13   Family History  Problem Relation Age of Onset  . Cirrhosis Father  deceased, secondary to ETOH   History  Substance Use Topics  . Smoking status: Never Smoker   . Smokeless tobacco: Current User    Types: Chew     Comment: Never smoked/ chews tobacco some  . Alcohol Use: 1.2 oz/week    2 Shots of liquor per week     Comment: daily    Review of Systems  Gastrointestinal: Positive for abdominal pain (bleeding from abdomen) and abdominal distention.  A complete 10 system review of systems was obtained and all systems are negative except as noted in the HPI and PMH.  Allergies  Ace inhibitors; Lorazepam; and Percocet  Home Medications   Prior to  Admission medications   Medication Sig Start Date End Date Taking? Authorizing Provider  ALPRAZolam (XANAX) 0.25 MG tablet Take 0.25 mg by mouth 3 (three) times daily as needed for anxiety.    Historical Provider, MD  diphenoxylate-atropine (LOMOTIL) 2.5-0.025 MG per tablet Take 1 tablet by mouth 3 (three) times daily as needed for diarrhea or loose stools. 07/24/12   Mahala Menghini, PA-C  esomeprazole (NEXIUM) 40 MG capsule Take 1 capsule (40 mg total) by mouth 2 (two) times daily before a meal. 02/02/13   Orvil Feil, NP  megestrol ([name]) 400 MG/10ML suspension Take 20 mLs (800 mg total) by mouth daily. 11/20/13   Baird Cancer, PA-C  metroNIDAZOLE (FLAGYL) 500 MG tablet Take 1 tablet (500 mg total) by mouth 3 (three) times daily. 12/21/13   Orvil Feil, NP  Pancrelipase, Lip-Prot-Amyl, (CREON) 24000 UNITS CPEP Take 1 capsule (24,000 Units total) by mouth 3 (three) times daily with meals. 10/05/13   Farrel Gobble, MD  promethazine (PHENERGAN) 25 MG tablet Take 1 tablet (25 mg total) by mouth every 6 (six) hours as needed for nausea or vomiting. 11/16/13   Sharyon Cable, MD  traMADol Veatrice Bourbon) 50 MG tablet Take 1 or 2 tablets up to every 6 hours as needed for pain 11/03/13   Farrel Gobble, MD  vancomycin (VANCOCIN) 125 MG capsule Take 1 capsule (125 mg total) by mouth 4 (four) times daily. 12/29/13   Orvil Feil, NP   Triage Vitals: BP 104/63 mmHg  Pulse 114  Temp(Src) 98 F (36.7 C) (Oral)  Resp 18  Ht 5\' 10"  (1.778 m)  Wt 142 lb (64.411 kg)  BMI 20.37 kg/m2  SpO2 100% Physical Exam  Constitutional: He appears well-developed and well-nourished. No distress.  Thin, underweight   HENT:  Head: Normocephalic and atraumatic.  Right Ear: External ear normal.  Left Ear: External ear normal.  Eyes: Conjunctivae are normal. Right eye exhibits no discharge. Left eye exhibits no discharge. No scleral icterus.  Neck: Neck supple. No tracheal deviation present.  Cardiovascular: Normal rate,  regular rhythm and intact distal pulses.   Pulmonary/Chest: Effort normal and breath sounds normal. No stridor. No respiratory distress. He has no wheezes. He has no rales.  Abdominal: Soft. Bowel sounds are normal. He exhibits no distension, no ascites and no mass. There is no tenderness. There is no rebound and no guarding.  Small scab inferior aspect of old surgical scar, no induration, no bleeding.   Musculoskeletal: He exhibits no edema or tenderness.  Neurological: He is alert. He has normal strength. No cranial nerve deficit (no facial droop, extraocular movements intact, no slurred speech) or sensory deficit. He exhibits normal muscle tone. He displays no seizure activity. Coordination normal.  Skin: Skin is warm and dry. No rash noted.  Psychiatric: He  has a normal mood and affect.  Nursing note and vitals reviewed.   ED Course  Procedures (including critical care time) DIAGNOSTIC STUDIES: Oxygen Saturation is 100% on room air, normal by my interpretation.    COORDINATION OF CARE: 5:26 PM-Discussed treatment plan which includes lab work with pt at bedside and pt agreed to plan.    Labs Review Labs Reviewed  COMPREHENSIVE METABOLIC PANEL  CBC WITH DIFFERENTIAL  PROTIME-INR    MDM   Final diagnoses:  Cirrhosis of liver without ascites, unspecified hepatic cirrhosis type  Anemia, unspecified anemia type    The patient's family was concerned about the possibility of a mass underlying his incision. I reviewed the patient's old records. He had an MRI of his abdomen in September of this year. There were tiny nodules noted in the liver but otherwise no mass noted.  Patient's laboratory test do show that he has an anemia that seems to be gradually progressing over time. He is not having any acute bleeding here in the emergency department. That should be followed up by his primary care doctor.  Patient's INR is elevated associated with his liver disease and this is likely  contributing to these episodes of bleeding. Patient should follow-up with his primary doctor and hepatologist..  At this time there does not appear to be any evidence of an acute emergency medical condition and the patient appears stable for discharge with appropriate outpatient follow up.   I personally performed the services described in this documentation, which was scribed in my presence.  The recorded information has been reviewed and is accurate.     Dorie Rank, MD 01/01/2014 [phone]

## 2014-01-05 NOTE — ED Notes (Signed)
MD at bedside. 

## 2014-01-19 ENCOUNTER — Encounter: Payer: Self-pay | Admitting: Gastroenterology

## 2014-01-19 ENCOUNTER — Encounter: Payer: Self-pay | Admitting: Internal Medicine

## 2014-01-19 ENCOUNTER — Other Ambulatory Visit: Payer: Self-pay

## 2014-01-19 ENCOUNTER — Ambulatory Visit (INDEPENDENT_AMBULATORY_CARE_PROVIDER_SITE_OTHER): Payer: Medicaid Other | Admitting: Nurse Practitioner

## 2014-01-19 VITALS — BP 114/70 | HR 108 | Temp 97.6°F | Ht 67.0 in | Wt 147.2 lb

## 2014-01-19 DIAGNOSIS — K7030 Alcoholic cirrhosis of liver without ascites: Secondary | ICD-10-CM

## 2014-01-19 DIAGNOSIS — I8500 Esophageal varices without bleeding: Secondary | ICD-10-CM

## 2014-01-19 LAB — COMPREHENSIVE METABOLIC PANEL
ALBUMIN: 2.5 g/dL — AB (ref 3.5–5.2)
ALT: 15 U/L (ref 0–53)
AST: 67 U/L — ABNORMAL HIGH (ref 0–37)
Alkaline Phosphatase: 150 U/L — ABNORMAL HIGH (ref 39–117)
BUN: 8 mg/dL (ref 6–23)
CALCIUM: 8.2 mg/dL — AB (ref 8.4–10.5)
CHLORIDE: 102 meq/L (ref 96–112)
CO2: 23 mEq/L (ref 19–32)
Creat: 0.98 mg/dL (ref 0.50–1.35)
GLUCOSE: 94 mg/dL (ref 70–99)
POTASSIUM: 3.5 meq/L (ref 3.5–5.3)
Sodium: 139 mEq/L (ref 135–145)
TOTAL PROTEIN: 5.9 g/dL — AB (ref 6.0–8.3)
Total Bilirubin: 2.1 mg/dL — ABNORMAL HIGH (ref 0.3–1.2)

## 2014-01-19 LAB — CBC
HEMATOCRIT: 27.5 % — AB (ref 39.0–52.0)
Hemoglobin: 9.3 g/dL — ABNORMAL LOW (ref 13.0–17.0)
MCH: 35.1 pg — ABNORMAL HIGH (ref 26.0–34.0)
MCHC: 33.8 g/dL (ref 30.0–36.0)
MCV: 103.8 fL — ABNORMAL HIGH (ref 78.0–100.0)
MPV: 10.3 fL (ref 9.4–12.4)
Platelets: 121 10*3/uL — ABNORMAL LOW (ref 150–400)
RBC: 2.65 MIL/uL — AB (ref 4.22–5.81)
RDW: 14.9 % (ref 11.5–15.5)
WBC: 6.3 10*3/uL (ref 4.0–10.5)

## 2014-01-19 LAB — PROTIME-INR
INR: 1.52 — AB (ref <1.50)
Prothrombin Time: 18.5 seconds — ABNORMAL HIGH (ref 11.6–15.2)

## 2014-01-19 LAB — AFP TUMOR MARKER

## 2014-01-19 NOTE — Patient Instructions (Signed)
1. Stop drinking 2. Recheck labs and we can call you with the results. 3. Repeat the MRI 4. Start taking the Nadolol again, it's very important! 5. Return in 4 weeks for re-evaluation 6. Call us with any changes or problems

## 2014-01-19 NOTE — Assessment & Plan Note (Addendum)
Patient with worsening weakness. Diarrhea and GERD symtoms resolved. MRI showed several tiny sub-centimeter lesions of both hepatic lobes unable to be classified. Patient having new mild abdominal swelling and new 2-3+ pitting bilateral LE edema of the feet and ankles. Continues to drink ETOH (0.5-1 pint hard liquor/day) which is likely culprit for decompensation of liver function.  Will check labs today: CBC, CMP, PT/INR. Will also check afp in light of MRI with concerning lesions. Will order repeat MRI for follow-up based on recommendations.  Follow-up office visit in 4 weeks for re-evaluation and further plan.

## 2014-01-19 NOTE — Assessment & Plan Note (Signed)
Grade 1 esophageal varices on last EGD, stopped taking Nadolol a few months ago but wife states she'll get him to start taking again.  Restart Nadolol, call us if any tolerance problems. No need to repeat EGD if he stays on Nadolol prophylaxis.

## 2014-01-19 NOTE — Progress Notes (Signed)
Referring Provider: Lanette Hampshire, MD Primary Care Physician:  Lanette Hampshire, MD  Primary GI: Dr. Gala Romney  Chief Complaint  Patient presents with  . Edema    bleeding    HPI:   63 year old male presents for office follow-up. History of colon cancer and alcoholic cirrhosis. Up to date on colonoscopy surveillance, next due Feb 2017. Had CT A/P 10/09/13 which showed 23mm low-attenuation right hepatic lobe lesion that appeared new. Follow-up MRI found several tiny subcentimeter nonspecific lesions in both right and left hepatic lobes which were difficult to characterize given their small size but tiny hepatic metastases cannot be excluded. Recommended MRI follow-up in 3-6 months. Last EGD done 08/06/12 and showed non-critical Schatzki's ring, grade 1 esophageal varices, Barrett's esophagus which was biopsied, mild erosive esophagitis, hiatal hernia, portal gastropathy, and gastric erythema which was biopsied. Both the stomach and esophageal biopsies were benign. Started on Nadalol 40mg  which he could not tolerate and was subsequently decreased to 20mg . Also with Gastroparesis for which he was prescribed low dose Reglan tid prn. Per patient's wife he is still consuming about 0.5-1 pint of hard liquor a day.  States he's been feeling "a little on the weak side." Lack of energy. Stopped taking his Nadolol approximately a couple months ago. Wife states he "sometimes gets in these moods where he doesn't like taking pills and will stop them." Wife states she will try and get him back on it. Has had a couple recurrent episodes of the incisional bleeding as noted in his history and previous visits. Occasionally cauterized by Dr. Arnoldo Morale.  Admits occasional chills but no fever. GERD symptoms improved, stopped taking Nexium while on CDiff antibiotic treatments and has not had to restart. Denies vomiting, occasional nausea usually 2-3 times a week which is relieved by Phenergan. Takes Reglan about three times a  day. Denies constipation or diarrhea, has about 3 BMs a day. Occasional lower abdominal pain associated with oozing surgical wound. Denies hematochezia or melena. Has started having LE edema to the feet and ankles x 2-3 weeks. Also admits some abdominal swelling, "worse than what it used to be" per the patient's wife.  Past Medical History  Diagnosis Date  . Gout   . GERD (gastroesophageal reflux disease)   . Cirrhosis     ?ETOH related, afp 02/05/12= 4.9, ct on 10/16/11,no  Hep A/B vaccines  . Colon cancer Dec 2011    Cecum  . ARF (acute renal failure)     hospitalization Jan 2012  . H/O ETOH abuse   . 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 S/P endoscopy Dec 2011    Schatzki's ring, Grade 1 esophageal varices, antral/body erosions  . Anemia of chronic disease 08/10/2010  . Renal insufficiency 10/24/2010  . Schatzki's ring   . Varices, esophageal   . Gastroparesis 01/2013    Past Surgical History  Procedure Laterality Date  . Colon surgery  02/24/2010    colon cancer (cecum)  . Exploratory laparotomy  03/03/2010    anastomotic leak, developed EC fistula  . Exploratory laparotomy w/ bowel resection  04/18/2010    ileostomy placed, (hx of EC fistula, anastomotic leak). About two feet of ileum removed.  . Colostomy    . Percutaneous drainage of intraabdominal abscess  07/2010 and 09/2010  . Partial colectomy  10/30/2010    Procedure: PARTIAL COLECTOMY;  Surgeon: Jamesetta So;  Location: [facility];  Service: General;  Laterality: N/A;  . Ileostomy closure  10/30/2010    Procedure: ILEOSTOMY TAKEDOWN;  Surgeon: Jamesetta So;  Location: [facility];  Service: General;  Laterality: N/A;  . Colonoscopy  Dec 2011    cecal mass, tubulovillous adenoma at splenic flexure  . Esophagogastroduodenoscopy  Dec 2011    Schatzki's ring, Grade 1 esophageal varices, antral/body erosions  . Colonoscopy N/A 04/02/2012    ERD:EYCXKG residual rectal and colonic mucosa.  Next colonoscopy in 03/2015.  Marland Kitchen Esophagogastroduodenoscopy N/A 08/06/2012    RMR: Grade 1 esophageal varices. Abnormal distal esophageal mucosa-3 short segment Barrett's-like appearance but negative biopsy for barrett's. mild erosive reflux esophagitis, non-critical Schatzki's ring, hiatal hernia, portal gastropathy.   . Esophageal banding N/A 08/06/2012    no banding   . Wound exploration N/A 02/25/2013    Procedure: EXCISION OF SUTURE GRANULOMA ABDOMINAL WALL;  Surgeon: Jamesetta So, MD;  Location: [facility];  Service: General;  Laterality: N/A;  . Prostate biopsy  08/14/13    Current Outpatient Prescriptions  Medication Sig Dispense Refill  . ALPRAZolam (XANAX) 0.5 MG tablet every 4 (four) hours as needed for anxiety.   3  . diphenoxylate-atropine (LOMOTIL) 2.5-0.025 MG per tablet Take 1 tablet by mouth 3 (three) times daily as needed for diarrhea or loose stools.    Marland Kitchen esomeprazole (NEXIUM) 40 MG capsule Take 1 capsule (40 mg total) by mouth 2 (two) times daily before a meal. 60 capsule 5  . megestrol ([name]) 400 MG/10ML suspension Take 20 mLs (800 mg total) by mouth daily. 240 mL 3  . Pancrelipase, Lip-Prot-Amyl, (CREON) 24000 UNITS CPEP Take 1 capsule (24,000 Units total) by mouth 3 (three) times daily with meals. 180 capsule 6  . Probiotic Product (PROBIOTIC DAILY PO) Take 1 tablet by mouth daily.    . promethazine (PHENERGAN) 25 MG tablet Take 1 tablet (25 mg total) by mouth every 6 (six) hours as needed for nausea or vomiting. 15 tablet 0  . traMADol (ULTRAM) 50 MG tablet Take 1 or 2 tablets up to every 6 hours as needed for pain 30 tablet 5  . metroNIDAZOLE (FLAGYL) 500 MG tablet Take 1 tablet (500 mg total) by mouth 3 (three) times daily. (Patient not taking: Reported on 01/19/2014) 42 tablet 0  . vancomycin (VANCOCIN) 125 MG capsule Take 1 capsule (125 mg total) by mouth 4 (four) times daily. (Patient not taking: Reported on 01/19/2014) 40 capsule 0   No current facility-administered  medications for this visit.    Allergies as of 01/19/2014 - Review Complete 01/19/2014  Allergen Reaction Noted  . Ace inhibitors  02/25/2013  . Lorazepam Other (See Comments) 08/01/2010  . Percocet [oxycodone-acetaminophen] Itching and Nausea Only 08/01/2010    Family History  Problem Relation Age of Onset  . Cirrhosis Father     deceased, secondary to ETOH    History   Social History  . Marital Status: Married    Spouse Name: N/A    Number of Children: N/A  . Years of Education: N/A   Social History Main Topics  . Smoking status: Never Smoker   . Smokeless tobacco: Current User    Types: Chew     Comment: Never smoked/ chews tobacco some  . Alcohol Use: 1.2 oz/week    2 Shots of liquor per week     Comment: daily  . Drug Use: No  . Sexual Activity: Yes    Birth Control/ Protection: None   Other Topics Concern  . None  Social History Narrative    Review of Systems: Gen: See HPI. Denies fever, anorexia. Admits fatigue and weakness. Appetitie improved. CV: Denies chest pain, palpitations. LE peripheral edema as per HPI. Resp: Denies dyspnea at rest, cough, wheezing, coughing up blood, and pleurisy. GI: See HPI. Denies vomiting blood, jaundice. Denies dysphagia or odynophagia. Derm: Denies rash, itching, dry skin Psych: Denies depression, anxiety, memory loss, confusion.  Heme: Denies bruising. Admits bleeding of abdominal wound site per HPI.   Physical Exam: BP 114/70 mmHg  Pulse 108  Temp(Src) 97.6 F (36.4 C) (Oral)  Ht 5\' 7"  (1.702 m)  Wt 147 lb 3.2 oz (66.769 kg)  BMI 23.05 kg/m2 General:   Alert and oriented. No distress noted. Pleasant and cooperative.  Head:  Normocephalic and atraumatic. Eyes:  Conjuctiva clear without scleral icterus. Neck:  Supple, without mass or thyromegaly. Heart:  S1, S2 present without murmurs, rubs, or gallops. Regular rate and rhythm. Abdomen:  +BS, soft, non-tender. Mild fullness to periumbilical area but no tense  ascites. No rebound or guarding. No HSM or masses noted. Midline abdominal incision scar noted. Lower abdominal pinpoint wound noted with no active bleeding, covered with a bandage Msk:  Symmetrical without gross deformities. Normal posture. Pulses:  2+ DP noted bilaterally Extremities:  2-3+ pitting edema of bilateral ankles and feet. Neurologic:  Alert and  oriented x4;  grossly normal neurologically. Skin:  Intact without significant lesions or rashes. Psych:  Alert and cooperative. Normal mood and affect.    01/19/2014 11:27 AM

## 2014-01-19 DEATH — deceased

## 2014-01-20 NOTE — Progress Notes (Signed)
Pt is set up for MRI on 01/22/14 @ 11:00. The PA # is [id number]

## 2014-01-22 ENCOUNTER — Ambulatory Visit (HOSPITAL_COMMUNITY)
Admission: RE | Admit: 2014-01-22 | Discharge: 2014-01-22 | Disposition: A | Discharge status: Home / Self Care | Payer: Medicaid Other | Source: Ambulatory Visit | Attending: Nurse Practitioner | Admitting: Nurse Practitioner

## 2014-01-22 ENCOUNTER — Other Ambulatory Visit: Payer: Self-pay | Admitting: Nurse Practitioner

## 2014-01-22 DIAGNOSIS — Z85038 Personal history of other malignant neoplasm of large intestine: Secondary | ICD-10-CM | POA: Diagnosis not present

## 2014-01-22 DIAGNOSIS — K769 Liver disease, unspecified: Secondary | ICD-10-CM | POA: Insufficient documentation

## 2014-01-22 DIAGNOSIS — K7030 Alcoholic cirrhosis of liver without ascites: Secondary | ICD-10-CM | POA: Insufficient documentation

## 2014-01-22 MED ORDER — GADOBENATE DIMEGLUMINE 529 MG/ML IV SOLN
13.0000 mL | Freq: Once | INTRAVENOUS | Status: AC | PRN
  Administered 2014-01-22: 13 mL via INTRAVENOUS

## 2014-01-22 MED ORDER — SPIRONOLACTONE 50 MG PO TABS: 100.0000 mg | ORAL_TABLET | Freq: Every day | ORAL | Status: AC

## 2014-01-22 MED ORDER — FUROSEMIDE 20 MG PO TABS: 40.0000 mg | ORAL_TABLET | Freq: Every day | ORAL | Status: DC

## 2014-01-25 ENCOUNTER — Telehealth: Payer: Self-pay

## 2014-01-25 ENCOUNTER — Other Ambulatory Visit: Payer: Self-pay

## 2014-01-25 NOTE — Progress Notes (Signed)
Quick Note:  Tried to call pt- LMOM ______ 

## 2014-01-25 NOTE — Telephone Encounter (Signed)
I spoke with the patients wife about his blood work results and she was asking about his MRI results.  She also wanted me to let Randall Hiss know that she restarted the nadolol and when he takes 2 of them his blood pressure drops to about 88/60 and he can barely get out of bed, when she gives him 1 his blood pressure is 103/70, and he seems to do ok. She said his pulse was in the 80's.  She wants to know if she can give him just one a day?

## 2014-01-26 ENCOUNTER — Telehealth: Payer: Self-pay | Admitting: Internal Medicine

## 2014-01-26 MED ORDER — DIPHENOXYLATE-ATROPINE 2.5-0.025 MG PO TABS: 1.0000 | ORAL_TABLET | Freq: Three times a day (TID) | ORAL | Status: DC | PRN

## 2014-01-26 NOTE — Telephone Encounter (Signed)
LMOM to call back

## 2014-01-26 NOTE — Telephone Encounter (Signed)
LMOM for a return call. Also, [name] has the prescription ready to pick up for the Lomotil.

## 2014-01-26 NOTE — Telephone Encounter (Signed)
Please call back on mri from last week.  Also patient blood pressure is low

## 2014-01-26 NOTE — Telephone Encounter (Signed)
Ginger tried to call pts wife back- see other phone note.

## 2014-01-26 NOTE — Telephone Encounter (Signed)
Pt's wife called early this morning and spoke to [place] and was transferred into JL VM.  Patient's wife called again at noon asking for JL and I told her that JL would not be in until later today and we were not sure what time that would be. I offered to put her in the VM, but she said she had already left a message from this morning and she would just wait until later today to call back. Please return her call at [phone]

## 2014-01-26 NOTE — Telephone Encounter (Signed)
MRI results show resolution of two of the four lesions that were found on ultrasound. The remaining two that are still seen on MRI are stable, which is reassuring. Recommend a repeat MRI in 3 months given his medical history and past history of colon cancer. As far as the Nadalol, if he is not tolerating 2 it's ok to try one and see how well he tolerates that in the long term. If he has continued problems please let us know.

## 2014-01-27 NOTE — Telephone Encounter (Signed)
ON RECALL FOR MRI  °

## 2014-01-27 NOTE — Telephone Encounter (Signed)
rx has been faxed to [facility]. Pts wife is aware.

## 2014-01-27 NOTE — Telephone Encounter (Signed)
pts wife is aware. She is still worried about his blood pressure. Advised her to keep track of it and let us know how he does and if it gets too low and he feels he may pass out she should take him to the ED. She agreed with the plan.  Erline Levine, please nic MRI in 3 months.

## 2014-01-29 ENCOUNTER — Encounter (HOSPITAL_BASED_OUTPATIENT_CLINIC_OR_DEPARTMENT_OTHER): Payer: Medicaid Other

## 2014-01-29 ENCOUNTER — Encounter (HOSPITAL_COMMUNITY): Payer: Medicaid Other | Attending: Hematology and Oncology

## 2014-01-29 ENCOUNTER — Encounter (HOSPITAL_COMMUNITY): Payer: Self-pay

## 2014-01-29 VITALS — BP 105/67 | HR 84 | Temp 97.7°F | Resp 18 | Wt 139.0 lb

## 2014-01-29 DIAGNOSIS — Z85038 Personal history of other malignant neoplasm of large intestine: Secondary | ICD-10-CM

## 2014-01-29 DIAGNOSIS — C189 Malignant neoplasm of colon, unspecified: Secondary | ICD-10-CM

## 2014-01-29 DIAGNOSIS — K7460 Unspecified cirrhosis of liver: Secondary | ICD-10-CM | POA: Diagnosis present

## 2014-01-29 DIAGNOSIS — K8020 Calculus of gallbladder without cholecystitis without obstruction: Secondary | ICD-10-CM

## 2014-01-29 DIAGNOSIS — K8681 Exocrine pancreatic insufficiency: Secondary | ICD-10-CM

## 2014-01-29 DIAGNOSIS — C61 Malignant neoplasm of prostate: Secondary | ICD-10-CM

## 2014-01-29 DIAGNOSIS — D731 Hypersplenism: Secondary | ICD-10-CM | POA: Diagnosis not present

## 2014-01-29 DIAGNOSIS — M549 Dorsalgia, unspecified: Secondary | ICD-10-CM

## 2014-01-29 DIAGNOSIS — K7030 Alcoholic cirrhosis of liver without ascites: Secondary | ICD-10-CM

## 2014-01-29 LAB — COMPREHENSIVE METABOLIC PANEL
ALT: 24 U/L (ref 0–53)
AST: 84 U/L — ABNORMAL HIGH (ref 0–37)
Albumin: 2.7 g/dL — ABNORMAL LOW (ref 3.5–5.2)
Alkaline Phosphatase: 195 U/L — ABNORMAL HIGH (ref 39–117)
Anion gap: 15 (ref 5–15)
BUN: 9 mg/dL (ref 6–23)
CO2: 27 mEq/L (ref 19–32)
CREATININE: 1.16 mg/dL (ref 0.50–1.35)
Calcium: 8.5 mg/dL (ref 8.4–10.5)
Chloride: 97 mEq/L (ref 96–112)
GFR calc Af Amer: 77 mL/min — ABNORMAL LOW (ref >90)
GFR calc non Af Amer: 66 mL/min — ABNORMAL LOW (ref >90)
Glucose, Bld: 104 mg/dL — ABNORMAL HIGH (ref 70–99)
Potassium: 3.5 mEq/L — ABNORMAL LOW (ref 3.7–5.3)
SODIUM: 139 meq/L (ref 137–147)
TOTAL PROTEIN: 6.7 g/dL (ref 6.0–8.3)
Total Bilirubin: 2.8 mg/dL — ABNORMAL HIGH (ref 0.3–1.2)

## 2014-01-29 LAB — CEA: CEA: 11.7 ng/mL — ABNORMAL HIGH (ref 0.0–5.0)

## 2014-01-29 LAB — CBC WITH DIFFERENTIAL/PLATELET
BASOS ABS: 0 10*3/uL (ref 0.0–0.1)
Basophils Relative: 0 % (ref 0–1)
EOS ABS: 0.1 10*3/uL (ref 0.0–0.7)
Eosinophils Relative: 1 % (ref 0–5)
HCT: 30.7 % — ABNORMAL LOW (ref 39.0–52.0)
Hemoglobin: 10.6 g/dL — ABNORMAL LOW (ref 13.0–17.0)
Lymphocytes Relative: 33 % (ref 12–46)
Lymphs Abs: 2.5 10*3/uL (ref 0.7–4.0)
MCH: 35.2 pg — AB (ref 26.0–34.0)
MCHC: 34.5 g/dL (ref 30.0–36.0)
MCV: 102 fL — ABNORMAL HIGH (ref 78.0–100.0)
Monocytes Absolute: 0.5 10*3/uL (ref 0.1–1.0)
Monocytes Relative: 6 % (ref 3–12)
NEUTROS ABS: 4.6 10*3/uL (ref 1.7–7.7)
NEUTROS PCT: 60 % (ref 43–77)
Platelets: 131 10*3/uL — ABNORMAL LOW (ref 150–400)
RBC: 3.01 MIL/uL — ABNORMAL LOW (ref 4.22–5.81)
RDW: 14.5 % (ref 11.5–15.5)
WBC: 7.7 10*3/uL (ref 4.0–10.5)

## 2014-01-29 MED ORDER — OXYCODONE HCL 5 MG PO TABS: ORAL_TABLET | ORAL | Status: DC

## 2014-01-29 NOTE — Patient Instructions (Signed)
[place] Discharge Instructions  RECOMMENDATIONS MADE BY THE CONSULTANT AND ANY TEST RESULTS WILL BE SENT TO YOUR REFERRING PHYSICIAN.  EXAM FINDINGS BY THE PHYSICIAN TODAY AND SIGNS OR SYMPTOMS TO REPORT TO CLINIC OR PRIMARY PHYSICIAN: Exam and findings as discussed by Dr.Formanek.  MEDICATIONS PRESCRIBED:  You were given a [name] prescription today for Oxycodone for pain. Continue other medications as prescribed.  INSTRUCTIONS/FOLLOW-UP: Return to clinic in 3 months for lab work and MD appointment. Report any issues/concerns to clinic as needed prior to appointments.  Thank you for choosing [name] to provide your oncology and hematology care.  To afford each patient quality time with our providers, please arrive at least 15 minutes before your scheduled appointment time.  With your help, our goal is to use those 15 minutes to complete the necessary work-up to ensure our physicians have the information they need to help with your evaluation and healthcare recommendations.    Effective January 1st, 2014, we ask that you re-schedule your appointment with our physicians should you arrive 10 or more minutes late for your appointment.  We strive to give you quality time with our providers, and arriving late affects you and other patients whose appointments are after yours.    Again, thank you for choosing [facility].  Our hope is that these requests will decrease the amount of time that you wait before being seen by our physicians.       _____________________________________________________________  Should you have questions after your visit to [facility], please contact our office at [phone] between the hours of 8:30 a.m. and 4:30 p.m.  Voicemails left after 4:30 p.m. will not be returned until the following business day.  For prescription refill requests, have your pharmacy contact our office with your prescription refill  request.    _______________________________________________________________  We hope that we have given you very good care.  You may receive a patient satisfaction survey in the mail, please complete it and return it as soon as possible.  We value your feedback!  _______________________________________________________________  Have you asked about our STAR program?  STAR stands for Survivorship Training and Rehabilitation, and this is a nationally recognized cancer care program that focuses on survivorship and rehabilitation.  Cancer and cancer treatments may cause problems, such as, pain, making you feel tired and keeping you from doing the things that you need or want to do. Cancer rehabilitation can help. Our goal is to reduce these troubling effects and help you have the best quality of life possible.  You may receive a survey from a nurse that asks questions about your current state of health.  Based on the survey results, all eligible patients will be referred to the [facility] program for an evaluation so we can better serve you!  A frequently asked questions sheet is available upon request.

## 2014-01-29 NOTE — Progress Notes (Signed)
LABS FOR PSA,CEA,CBCD,CMP

## 2014-01-29 NOTE — Progress Notes (Signed)
[facility]  OFFICE PROGRESS NOTE  Lanette Hampshire, MD [place] [address]  DIAGNOSIS: Colon cancer  Hypersplenism syndrome  Exocrine pancreatic insufficiency  Calculus of gallbladder without cholecystitis without obstruction  Chief Complaint  Patient presents with  . Hypersplenism  . Stage I colon cancer    CURRENT THERAPY: Watchful expectation.  INTERVAL HISTORY: [name] 63 y.o. male returns for followup of stage I colon cancer in the setting of cirrhosis of the liver with hypersplenism and mild thrombocytopenia.  08/14/2013 the patient underwent prostate ultrasound with multiple biopsies 2 of 12 showing evidence of adenocarcinoma, Gleason score 3+3=6 involving less than 5% of each core. No treatment recommended. Since his last visit he has undergone MRI of the liver on 10/22/2013 to further characterize previously noted abnormalities particularly in light of a 12 pound weight loss unexplained over the previous 6 months. The study was nondiagnostic and it was decided to repeat the MRI of the liver on 01/22/2014. 2 of the lesions have resolved and the others are unchanged making it less likely that metastatic disease is the culprit. Splenomegaly and fatty change in the liver was also documented. He continues on pancreatic enzyme supplements and was started recently on spironolactone for abdominal swelling and pain. Generalized weakness persists. He denies any nausea, vomiting, but does have chronic diarrhea without melena, or hematochezia. He denies any urinary hesitancy or incontinence. Back pain is severe not relieved by tramadol. He denies any worsening lower extremity swelling and has noticed decrease in abdominal swelling since the institution of additional diuretic therapy.  MEDICAL HISTORY: Past Medical History  Diagnosis Date  . Gout   . GERD (gastroesophageal reflux disease)   . Cirrhosis     ?ETOH  related, afp 02/05/12= 4.9, ct on 10/16/11,no  Hep A/B vaccines  . Colon cancer Dec 2011    Cecum  . ARF (acute renal failure)     hospitalization Jan 2012  . H/O ETOH abuse   . 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 S/P endoscopy Dec 2011    Schatzki's ring, Grade 1 esophageal varices, antral/body erosions  . Anemia of chronic disease 08/10/2010  . Renal insufficiency 10/24/2010  . Schatzki's ring   . Varices, esophageal   . Gastroparesis 01/2013    INTERIM HISTORY: has Alcoholic cirrhosis of liver; EPIGASTRIC PAIN; TRANSAMINASES, SERUM, ELEVATED; Personal history of colon cancer; Dehydration; Colon cancer; Anemia of chronic disease; Diarrhea; Weight loss, abnormal; Renal insufficiency; Retroperitoneal lymphadenopathy; Esophageal varices without bleeding; Chronic diarrhea; GERD (gastroesophageal reflux disease); Nausea alone; Hypersplenism syndrome; Thrombocytopenia due to hypersplenism; Elevated PSA; and Prostate cancer on his problem list.    ALLERGIES:  is allergic to ace inhibitors; lorazepam; and percocet.  MEDICATIONS: has a current medication list which includes the following prescription(s): alprazolam, diphenoxylate-atropine, esomeprazole, furosemide, megestrol, nadolol, pancrelipase (lip-prot-amyl), promethazine, spironolactone, tramadol, oxycodone, and probiotic product.  SURGICAL HISTORY:  Past Surgical History  Procedure Laterality Date  . Colon surgery  02/24/2010    colon cancer (cecum)  . Exploratory laparotomy  03/03/2010    anastomotic leak, developed EC fistula  . Exploratory laparotomy w/ bowel resection  04/18/2010    ileostomy placed, (hx of EC fistula, anastomotic leak). About two feet of ileum removed.  . Colostomy    . Percutaneous drainage of intraabdominal abscess  07/2010 and 09/2010  . Partial colectomy  10/30/2010    Procedure:  PARTIAL COLECTOMY;  Surgeon: Jamesetta So;  Location: [facility];  Service: General;   Laterality: N/A;  . Ileostomy closure  10/30/2010    Procedure: ILEOSTOMY TAKEDOWN;  Surgeon: Jamesetta So;  Location: [facility];  Service: General;  Laterality: N/A;  . Colonoscopy  Dec 2011    cecal mass, tubulovillous adenoma at splenic flexure  . Esophagogastroduodenoscopy  Dec 2011    Schatzki's ring, Grade 1 esophageal varices, antral/body erosions  . Colonoscopy N/A 04/02/2012    TXM:IWOEHO residual rectal and colonic mucosa. Next colonoscopy in 03/2015.  Marland Kitchen Esophagogastroduodenoscopy N/A 08/06/2012    RMR: Grade 1 esophageal varices. Abnormal distal esophageal mucosa-3 short segment Barrett's-like appearance but negative biopsy for barrett's. mild erosive reflux esophagitis, non-critical Schatzki's ring, hiatal hernia, portal gastropathy.   . Esophageal banding N/A 08/06/2012    no banding   . Wound exploration N/A 02/25/2013    Procedure: EXCISION OF SUTURE GRANULOMA ABDOMINAL WALL;  Surgeon: Jamesetta So, MD;  Location: [facility];  Service: General;  Laterality: N/A;  . Prostate biopsy  08/14/13    FAMILY HISTORY: family history includes Cirrhosis in his father. There is no history of Colon cancer.  SOCIAL HISTORY:  reports that he has never smoked. His smokeless tobacco use includes Chew. He reports that he drinks about 1.2 oz of alcohol per week. He reports that he does not use illicit drugs.  REVIEW OF SYSTEMS:  Other than that discussed above is noncontributory.  PHYSICAL EXAMINATION: ECOG PERFORMANCE STATUS: 1 - Symptomatic but completely ambulatory  Blood pressure 105/67, pulse 84, temperature 97.7 F (36.5 C), temperature source Oral, resp. rate 18, weight 139 lb (63.05 kg), SpO2 100 %.  GENERAL:alert, no distress and comfortable SKIN: skin color, texture, turgor are normal, no rashes or significant lesions EYES: PERLA; Conjunctiva are pink and non-injected, sclera clear SINUSES: No redness or tenderness over maxillary or ethmoid sinuses OROPHARYNX:no exudate, no erythema on  lips, buccal mucosa, or tongue. NECK: supple, thyroid normal size, non-tender, without nodularity. No masses CHEST: Increased AP diameter with bilateral gynecomastia and evidence of spider angiomata on the anterior chest wall. LYMPH:  no palpable lymphadenopathy in the cervical, axillary or inguinal LUNGS: clear to auscultation and percussion with normal breathing effort HEART: regular rate & rhythm and no murmurs. ABDOMEN:abdomen soft, non-tender and normal bowel sounds. Positive fluid wave and shifting dullness. Spleen tip palpable. MUSCULOSKELETAL:no cyanosis of digits and no clubbing. Range of motion normal. +2 lower extremity edema. NEURO: alert & oriented x 3 with fluent speech, no focal motor/sensory deficits. No evidence of asterixis.   LABORATORY DATA: Lab on 01/29/2014  Component Date Value Ref Range Status  . WBC 01/29/2014 7.7  4.0 - 10.5 K/uL Final  . RBC 01/29/2014 3.01* 4.22 - 5.81 MIL/uL Final  . Hemoglobin 01/29/2014 10.6* 13.0 - 17.0 g/dL Final  . HCT 01/29/2014 30.7* 39.0 - 52.0 % Final  . MCV 01/29/2014 102.0* 78.0 - 100.0 fL Final  . MCH 01/29/2014 35.2* 26.0 - 34.0 pg Final  . MCHC 01/29/2014 34.5  30.0 - 36.0 g/dL Final  . RDW 01/29/2014 14.5  11.5 - 15.5 % Final  . Platelets 01/29/2014 131* 150 - 400 K/uL Final  . Neutrophils Relative % 01/29/2014 60  43 - 77 % Final  . Neutro Abs 01/29/2014 4.6  1.7 - 7.7 K/uL Final  . Lymphocytes Relative 01/29/2014 33  12 - 46 % Final  . Lymphs Abs 01/29/2014 2.5  0.7 - 4.0 K/uL Final  . Monocytes  Relative 01/29/2014 6  3 - 12 % Final  . Monocytes Absolute 01/29/2014 0.5  0.1 - 1.0 K/uL Final  . Eosinophils Relative 01/29/2014 1  0 - 5 % Final  . Eosinophils Absolute 01/29/2014 0.1  0.0 - 0.7 K/uL Final  . Basophils Relative 01/29/2014 0  0 - 1 % Final  . Basophils Absolute 01/29/2014 0.0  0.0 - 0.1 K/uL Final  . Sodium 01/29/2014 139  137 - 147 mEq/L Final  . Potassium 01/29/2014 3.5* 3.7 - 5.3 mEq/L Final  . Chloride  01/29/2014 97  96 - 112 mEq/L Final  . CO2 01/29/2014 27  19 - 32 mEq/L Final  . Glucose, Bld 01/29/2014 104* 70 - 99 mg/dL Final  . BUN 01/29/2014 9  6 - 23 mg/dL Final  . Creatinine, Ser 01/29/2014 1.16  0.50 - 1.35 mg/dL Final  . Calcium 01/29/2014 8.5  8.4 - 10.5 mg/dL Final  . Total Protein 01/29/2014 6.7  6.0 - 8.3 g/dL Final  . Albumin 01/29/2014 2.7* 3.5 - 5.2 g/dL Final  . AST 01/29/2014 84* 0 - 37 U/L Final  . ALT 01/29/2014 24  0 - 53 U/L Final  . Alkaline Phosphatase 01/29/2014 195* 39 - 117 U/L Final  . Total Bilirubin 01/29/2014 2.8* 0.3 - 1.2 mg/dL Final  . GFR calc non Af Amer 01/29/2014 66* >90 mL/min Final  . GFR calc Af Amer 01/29/2014 77* >90 mL/min Final   Comment: (NOTE) The eGFR has been calculated using the CKD EPI equation. This calculation has not been validated in all clinical situations. eGFR's persistently <90 mL/min signify possible Chronic Kidney Disease.   . Anion gap 01/29/2014 15  5 - 15 Final  Office Visit on 01/19/2014  Component Date Value Ref Range Status  . WBC 01/19/2014 6.3  4.0 - 10.5 K/uL Final  . RBC 01/19/2014 2.65* 4.22 - 5.81 MIL/uL Final  . Hemoglobin 01/19/2014 9.3* 13.0 - 17.0 g/dL Final  . HCT 01/19/2014 27.5* 39.0 - 52.0 % Final  . MCV 01/19/2014 103.8* 78.0 - 100.0 fL Final  . MCH 01/19/2014 35.1* 26.0 - 34.0 pg Final  . MCHC 01/19/2014 33.8  30.0 - 36.0 g/dL Final  . RDW 01/19/2014 14.9  11.5 - 15.5 % Final  . Platelets 01/19/2014 121* 150 - 400 K/uL Final  . MPV 01/19/2014 10.3  9.4 - 12.4 fL Final  . Sodium 01/19/2014 139  135 - 145 mEq/L Final  . Potassium 01/19/2014 3.5  3.5 - 5.3 mEq/L Final  . Chloride 01/19/2014 102  96 - 112 mEq/L Final  . CO2 01/19/2014 23  19 - 32 mEq/L Final  . Glucose, Bld 01/19/2014 94  70 - 99 mg/dL Final  . BUN 01/19/2014 8  6 - 23 mg/dL Final  . Creat 01/19/2014 0.98  0.50 - 1.35 mg/dL Final  . Total Bilirubin 01/19/2014 2.1* 0.3 - 1.2 mg/dL Final  . Alkaline Phosphatase 01/19/2014 150* 39  - 117 U/L Final  . AST 01/19/2014 67* 0 - 37 U/L Final  . ALT 01/19/2014 15  0 - 53 U/L Final  . Total Protein 01/19/2014 5.9* 6.0 - 8.3 g/dL Final  . Albumin 01/19/2014 2.5* 3.5 - 5.2 g/dL Final  . Calcium 01/19/2014 8.2* 8.4 - 10.5 mg/dL Final  . Prothrombin Time 01/19/2014 18.5* 11.6 - 15.2 seconds Final  . INR 01/19/2014 1.52* <1.50 Final   Comment: The INR is of principal utility in following patients on stable doses of oral anticoagulants.  The therapeutic range  is generally 2.0 to 3.0, but may be 3.0 to 4.0 in patients with mechanical cardiac valves, recurrent embolisms and antiphospholipid antibodies (including lupus inhibitors).   Admission on 01/05/2014, Discharged on 01/05/2014  Component Date Value Ref Range Status  . Sodium 01/05/2014 139  137 - 147 mEq/L Final  . Potassium 01/05/2014 3.1* 3.7 - 5.3 mEq/L Final  . Chloride 01/05/2014 101  96 - 112 mEq/L Final  . CO2 01/05/2014 23  19 - 32 mEq/L Final  . Glucose, Bld 01/05/2014 97  70 - 99 mg/dL Final  . BUN 01/05/2014 7  6 - 23 mg/dL Final  . Creatinine, Ser 01/05/2014 0.98  0.50 - 1.35 mg/dL Final  . Calcium 01/05/2014 8.1* 8.4 - 10.5 mg/dL Final  . Total Protein 01/05/2014 6.3  6.0 - 8.3 g/dL Final  . Albumin 01/05/2014 2.6* 3.5 - 5.2 g/dL Final  . AST 01/05/2014 45* 0 - 37 U/L Final  . ALT 01/05/2014 11  0 - 53 U/L Final  . Alkaline Phosphatase 01/05/2014 119* 39 - 117 U/L Final  . Total Bilirubin 01/05/2014 3.0* 0.3 - 1.2 mg/dL Final  . GFR calc non Af Amer 01/05/2014 87* >90 mL/min Final  . GFR calc Af Amer 01/05/2014 >90  >90 mL/min Final   Comment: (NOTE) The eGFR has been calculated using the CKD EPI equation. This calculation has not been validated in all clinical situations. eGFR's persistently <90 mL/min signify possible Chronic Kidney Disease.   . Anion gap 01/05/2014 15  5 - 15 Final  . WBC 01/05/2014 6.0  4.0 - 10.5 K/uL Final  . RBC 01/05/2014 2.78* 4.22 - 5.81 MIL/uL Final  . Hemoglobin 01/05/2014  9.9* 13.0 - 17.0 g/dL Final  . HCT 01/05/2014 28.4* 39.0 - 52.0 % Final  . MCV 01/05/2014 102.2* 78.0 - 100.0 fL Final  . MCH 01/05/2014 35.6* 26.0 - 34.0 pg Final  . MCHC 01/05/2014 34.9  30.0 - 36.0 g/dL Final  . RDW 01/05/2014 15.3  11.5 - 15.5 % Final  . Platelets 01/05/2014 121* 150 - 400 K/uL Final  . Neutrophils Relative % 01/05/2014 69  43 - 77 % Final  . Neutro Abs 01/05/2014 4.1  1.7 - 7.7 K/uL Final  . Lymphocytes Relative 01/05/2014 22  12 - 46 % Final  . Lymphs Abs 01/05/2014 1.3  0.7 - 4.0 K/uL Final  . Monocytes Relative 01/05/2014 8  3 - 12 % Final  . Monocytes Absolute 01/05/2014 0.5  0.1 - 1.0 K/uL Final  . Eosinophils Relative 01/05/2014 1  0 - 5 % Final  . Eosinophils Absolute 01/05/2014 0.1  0.0 - 0.7 K/uL Final  . Basophils Relative 01/05/2014 0  0 - 1 % Final  . Basophils Absolute 01/05/2014 0.0  0.0 - 0.1 K/uL Final  . Prothrombin Time 01/05/2014 24.2* 11.6 - 15.2 seconds Final  . INR 01/05/2014 2.16* 0.00 - 1.49 Final    PATHOLOGY: No new pathology.  Urinalysis    Component Value Date/Time   COLORURINE RED* 11/16/2013 1138   APPEARANCEUR CLEAR 11/16/2013 1138   LABSPEC 1.010 11/16/2013 1138   PHURINE 6.5 11/16/2013 1138   GLUCOSEU 100* 11/16/2013 1138   HGBUR NEGATIVE 11/16/2013 1138   BILIRUBINUR LARGE* 11/16/2013 1138   KETONESUR 15* 11/16/2013 1138   PROTEINUR TRACE* 11/16/2013 1138   UROBILINOGEN >8.0* 11/16/2013 1138   NITRITE POSITIVE* 11/16/2013 1138   LEUKOCYTESUR NEGATIVE 11/16/2013 1138    RADIOGRAPHIC STUDIES: Mr Liver W Wo Contrast  09-Feb-2014   CLINICAL DATA:  Subsequent encounter for liver lesion in a patient with a history of colon cancer.  EXAM: MRI ABDOMEN WITHOUT AND WITH CONTRAST  TECHNIQUE: Multiplanar multisequence MR imaging of the abdomen was performed both before and after the administration of intravenous contrast.  CONTRAST:  49m MULTIHANCE GADOBENATE DIMEGLUMINE 529 MG/ML IV SOLN  COMPARISON:  10/22/2013.  FINDINGS: Lower  chest: Small right pleural effusion is new in the interval. There is some compressive atelectasis along the dome of the liver.  Hepatobiliary: On the previous study, four small indeterminate liver lesions were evident. On today's exam, only 2 of these lesions are visible. 1 is in the extreme tip of the lateral segment left liver, anterior to the spleen (see image 17 series 5009). This measures 10 mm and is stable.  The second lesion is seen in the posterior right liver (image 32 series 5009) and measures 8 mm which is unchanged.  Tiny stones are seen in the neck of the gallbladder (image 26 series 21). No intra or extrahepatic biliary duct dilatation.  Pancreas: No focal mass lesion. No dilatation of the main duct. No intraparenchymal cyst. No peripancreatic edema.  Spleen: No splenomegaly. No focal mass lesion.  Adrenals/Urinary Tract: No adrenal nodule or mass. Right kidney is unremarkable. 17 mm simple cyst identified in the interpolar left kidney.  Stomach/Bowel: No evidence for small bowel obstruction.  Vascular/Lymphatic: No abdominal aortic aneurysm. Portal vein and superior mesenteric vein are patent. Splenic vein is patent.  No lymphadenopathy in the abdomen.  Other: The does appear to be a small amount of mesenteric edema, as seen previously.  Musculoskeletal: Several small T1 hyperintense, T2 hyperintense lesions are identified within the thoracolumbar spine, suggesting cavernous hemangiomata.  IMPRESSION: Interval resolution of 2 of the indeterminate liver lesion seen previously with the remaining 2 lesions stable in the interval. These imaging findings are reassuring. Followup MRI in 3 months recommended to re-evaluate, given the patient's history of colon cancer.   Electronically Signed   By: EMisty StanleyM.D.   On: 01/22/2014 13:38    ASSESSMENT:  #1. Stage I colon cancer, status post colon resection, no evidence of disease.  #2. 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 #3. Weight loss of 12 pounds over the past 6 months, 3 pound weight gain while on pancreatic enzyme replacement. #4. Nonhealing surgical wound, resolved. #5. Carcinoma of the prostate, status post biopsy with 2 of 12 cores showing adenocarcinoma in less than 5% of each core, Gleason score 3+3= 6  #6. Pancreatic insufficiency, on treatment. #7. Inadequate pain control.   PLAN:  #1. Oxycodone 5-10 mg every 4 hours to control pain. #2. Continue current medications. #3. Follow-up in 3 months with CBC, chem profile, ferritin.   All questions were answered. The patient knows to call the clinic with any problems, questions or concerns. We can certainly see the patient much sooner if necessary.   I spent 25 minutes counseling the patient face to face. The total time spent in the appointment was 30 minutes.    FDoroteo Bradford MD 01/29/2014 9:42 AM  DISCLAIMER:  This note was dictated with voice recognition software.  Similar sounding words can inadvertently be transcribed inaccurately and may not be corrected upon review.

## 2014-01-29 NOTE — Addendum Note (Signed)
Addended by: Berneta Levins on: 01/29/2014 09:44 AM   Modules accepted: Orders

## 2014-01-30 LAB — PSA: PSA: 3.19 ng/mL (ref <=4.00)

## 2014-02-22 ENCOUNTER — Other Ambulatory Visit (HOSPITAL_COMMUNITY): Payer: Self-pay | Admitting: Hematology and Oncology

## 2014-03-03 ENCOUNTER — Encounter: Payer: Self-pay | Admitting: Gastroenterology

## 2014-03-03 ENCOUNTER — Ambulatory Visit (INDEPENDENT_AMBULATORY_CARE_PROVIDER_SITE_OTHER): Payer: Medicaid Other | Admitting: Gastroenterology

## 2014-03-03 VITALS — BP 106/68 | HR 97 | Temp 97.8°F | Ht 70.0 in | Wt 141.8 lb

## 2014-03-03 DIAGNOSIS — Z85038 Personal history of other malignant neoplasm of large intestine: Secondary | ICD-10-CM

## 2014-03-03 DIAGNOSIS — K7030 Alcoholic cirrhosis of liver without ascites: Secondary | ICD-10-CM

## 2014-03-03 LAB — COMPREHENSIVE METABOLIC PANEL
ALBUMIN: 3.4 g/dL — AB (ref 3.5–5.2)
ALT: 16 U/L (ref 0–53)
AST: 44 U/L — AB (ref 0–37)
Alkaline Phosphatase: 129 U/L — ABNORMAL HIGH (ref 39–117)
BUN: 11 mg/dL (ref 6–23)
CHLORIDE: 100 meq/L (ref 96–112)
CO2: 25 mEq/L (ref 19–32)
CREATININE: 1.11 mg/dL (ref 0.50–1.35)
Calcium: 9 mg/dL (ref 8.4–10.5)
Glucose, Bld: 78 mg/dL (ref 70–99)
POTASSIUM: 3.9 meq/L (ref 3.5–5.3)
Sodium: 136 mEq/L (ref 135–145)
Total Bilirubin: 2.7 mg/dL — ABNORMAL HIGH (ref 0.2–1.2)
Total Protein: 6.7 g/dL (ref 6.0–8.3)

## 2014-03-03 MED ORDER — PROPRANOLOL HCL 20 MG PO TABS: 20.0000 mg | ORAL_TABLET | Freq: Two times a day (BID) | ORAL | Status: AC

## 2014-03-03 NOTE — Progress Notes (Signed)
Referring Provider: Lanette Hampshire, MD Primary Care Physician:  Lanette Hampshire, MD  Primary GI: Dr. Gala Romney   Chief Complaint  Patient presents with  . Cirrhosis    HPI:   Peter [name] is a 64 y.o. male presenting today with a history of ETOH cirrhosis, colon cancer, up-to-date on colonoscopy surveillance with next due in Feb 2017. Last EGD done 08/06/12 and showed non-critical Schatzki's ring, grade 1 esophageal varices, mild erosive esophagitis, hiatal hernia, portal gastropathy, and gastric erythema which was biopsied. Both the stomach and esophageal biopsies were benign. Started on Nadalol 40mg  which he could not tolerate and was subsequently decreased to 20mg . Last visit in Dec 2015 was advised to restart Nadolol, as he had stopped this. Noted worsening lower extremity edema and non-tense ascites in the setting of continued ETOH use. Started on Lasix 40 mg and Aldactone 100 mg. Repeat MRI completed due to questionable liver lesions in Sept 2015. This showed interval resolution of the 2 indeterminate liver lesions, with 2 remaining lesions stable. Needs repeat MRI in March 2016.    Stopped taking Nadolol. Didn't like how it made him feel. Notes hypotensive even off of Nadolol. Swelling much improved. Was 147 Jan 19, 2014, now 141. No abdominal pain. No energy. Drinks a swig of alcohol daily.    Past Medical History  Diagnosis Date  . Gout   . GERD (gastroesophageal reflux disease)   . Cirrhosis     ?ETOH related, afp 02/05/12= 4.9, ct on 10/16/11,no  Hep A/B vaccines  . Colon cancer Dec 2011    Cecum  . ARF (acute renal failure)     hospitalization Jan 2012  . H/O ETOH abuse   . 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 S/P endoscopy Dec 2011    Schatzki's ring, Grade 1 esophageal varices, antral/body erosions  . Anemia of chronic disease 08/10/2010  . Renal insufficiency 10/24/2010  . Schatzki's ring   . Varices, esophageal   .  Gastroparesis 01/2013    Past Surgical History  Procedure Laterality Date  . Colon surgery  02/24/2010    colon cancer (cecum)  . Exploratory laparotomy  03/03/2010    anastomotic leak, developed EC fistula  . Exploratory laparotomy w/ bowel resection  04/18/2010    ileostomy placed, (hx of EC fistula, anastomotic leak). About two feet of ileum removed.  . Colostomy    . Percutaneous drainage of intraabdominal abscess  07/2010 and 09/2010  . Partial colectomy  10/30/2010    Procedure: PARTIAL COLECTOMY;  Surgeon: Jamesetta So;  Location: [facility];  Service: General;  Laterality: N/A;  . Ileostomy closure  10/30/2010    Procedure: ILEOSTOMY TAKEDOWN;  Surgeon: Jamesetta So;  Location: [facility];  Service: General;  Laterality: N/A;  . Colonoscopy  Dec 2011    cecal mass, tubulovillous adenoma at splenic flexure  . Esophagogastroduodenoscopy  Dec 2011    Schatzki's ring, Grade 1 esophageal varices, antral/body erosions  . Colonoscopy N/A 04/02/2012    IRS:WNIOEV residual rectal and colonic mucosa. Next colonoscopy in 03/2015.  Marland Kitchen Esophagogastroduodenoscopy N/A 08/06/2012    RMR: Grade 1 esophageal varices. Abnormal distal esophageal mucosa-3 short segment Barrett's-like appearance but negative biopsy for barrett's. mild erosive reflux esophagitis, non-critical Schatzki's ring, hiatal hernia, portal gastropathy.   . Esophageal banding N/A 08/06/2012    no banding   . Wound exploration N/A 02/25/2013    Procedure: EXCISION OF SUTURE GRANULOMA  ABDOMINAL WALL;  Surgeon: Jamesetta So, MD;  Location: [facility];  Service: General;  Laterality: N/A;  . Prostate biopsy  08/14/13    Current Outpatient Prescriptions  Medication Sig Dispense Refill  . ALPRAZolam (XANAX) 0.5 MG tablet every 4 (four) hours as needed for anxiety.   3  . diphenoxylate-atropine (LOMOTIL) 2.5-0.025 MG per tablet Take 1 tablet by mouth 3 (three) times daily as needed for diarrhea or loose stools. 30 tablet 0  . esomeprazole (NEXIUM) 40  MG capsule Take 1 capsule (40 mg total) by mouth 2 (two) times daily before a meal. 60 capsule 5  . furosemide (LASIX) 20 MG tablet Take 2 tablets (40 mg total) by mouth daily. 60 tablet 3  . megestrol ([name]) 400 MG/10ML suspension Take 20 mLs (800 mg total) by mouth daily. 240 mL 3  . nadolol (CORGARD) 20 MG tablet Take 40 mg by mouth daily. Take two 20 mg tablets daily    . Pancrelipase, Lip-Prot-Amyl, (CREON) 24000 UNITS CPEP Take 1 capsule (24,000 Units total) by mouth 3 (three) times daily with meals. 180 capsule 6  . Probiotic Product (PROBIOTIC DAILY PO) Take 1 tablet by mouth daily.    . promethazine (PHENERGAN) 25 MG tablet Take 1 tablet (25 mg total) by mouth every 6 (six) hours as needed for nausea or vomiting. 15 tablet 0  . spironolactone (ALDACTONE) 50 MG tablet Take 2 tablets (100 mg total) by mouth daily. 60 tablet 3  . traMADol (ULTRAM) 50 MG tablet Take 1 or 2 tablets up to every 6 hours as needed for pain 30 tablet 5  . oxyCODONE (OXY IR/ROXICODONE) 5 MG immediate release tablet Take 1 or 2 tablets every 4 hours to control pain. (Patient not taking: Reported on 03/03/2014) 100 tablet 0   No current facility-administered medications for this visit.    Allergies as of 03/03/2014 - Review Complete 03/03/2014  Allergen Reaction Noted  . Ace inhibitors  02/25/2013  . Lorazepam Other (See Comments) 08/01/2010  . Percocet [oxycodone-acetaminophen] Itching and Nausea Only 08/01/2010    Family History  Problem Relation Age of Onset  . Cirrhosis Father     deceased, secondary to ETOH  . Colon cancer Neg Hx     History   Social History  . Marital Status: Married    Spouse Name: N/A    Number of Children: N/A  . Years of Education: N/A   Social History Main Topics  . Smoking status: Never Smoker   . Smokeless tobacco: Current User    Types: Chew     Comment: Never smoked/ chews tobacco some  . Alcohol Use: 1.2 oz/week    2 Shots of liquor per week     Comment: daily   . Drug Use: No  . Sexual Activity: Yes    Birth Control/ Protection: None   Other Topics Concern  . None   Social History Narrative    Review of Systems: As mentioned in HPI  Physical Exam: BP 106/68 mmHg  Pulse 97  Temp(Src) 97.8 F (36.6 C) (Oral)  Ht 5\' 10"  (1.778 m)  Wt 141 lb 12.8 oz (64.32 kg)  BMI 20.35 kg/m2 General:   Alert and oriented. No distress noted. Appears chronically ill Head:  Normocephalic and atraumatic. Eyes:  Conjuctiva clear without scleral icterus. Abdomen:  +BS, soft, rounded, mild fullness but no evidence of non-tense ascites Msk:  Symmetrical without gross deformities. Normal posture. Extremities:  Without edema. Neurologic:  Alert and  oriented x4;  grossly normal neurologically. Skin:  Intact without significant lesions or rashes. Psych:  Alert and cooperative. Normal mood and affect.  Lab Results  Component Value Date   WBC 7.7 01/29/2014   HGB 10.6* 01/29/2014   HCT 30.7* 01/29/2014   MCV 102.0* 01/29/2014   PLT 131* 01/29/2014

## 2014-03-03 NOTE — Patient Instructions (Signed)
In place of Nadolol, I would like to try propanolol 20 mg twice a day.   Decrease Lasix to 20 mg each morning and aldactone 50 mg each morning.   Call me if the top number in your blood pressure is below 100 consistently or if you feel worsening fatigue.   Please complete the blood work today.   It is very important that you quit drinking alcohol.   We will see you in 6 weeks.

## 2014-03-04 ENCOUNTER — Telehealth: Payer: Self-pay | Admitting: Internal Medicine

## 2014-03-04 NOTE — Telephone Encounter (Signed)
Patient wife called with question about lasix.  Could not cut the ones he has in half because they are so small and want to know if a 10 mg can be called in

## 2014-03-04 NOTE — Telephone Encounter (Signed)
Routing to AS 

## 2014-03-05 MED ORDER — FUROSEMIDE 20 MG PO TABS: 20.0000 mg | ORAL_TABLET | Freq: Every day | ORAL | Status: AC

## 2014-03-05 NOTE — Addendum Note (Signed)
Addended by: Orvil Feil on: 03/05/2014 11:06 AM   Modules accepted: Orders

## 2014-03-05 NOTE — Telephone Encounter (Signed)
Actually, let me send in the 20 mg Lasix tablet. He also needs to take aldactone 50 mg daily. Was he able to cut that tablet in half? Please also let them know his blood work looks good.

## 2014-03-08 NOTE — Progress Notes (Signed)
Quick Note:  Labs: Tbili 2.7 (stable). Alk phos improved to 129. AST 44, ALT normal. Albumin improved to 3.4 (was 2.7).  Please let patient know renal function looks good. Keep follow-up for 6 weeks. ______

## 2014-03-08 NOTE — Assessment & Plan Note (Signed)
Has continued to drink ETOH although reports significant reduction in intake. Repeat MRI on file with stable findings; needs repeat MRI in March 2016. Lower extremity edema resolved with lasix 40 mg and aldactone 100 mg; however, notes persistent hypotension and fatigue. With improvement in edema, reduce dosing to lasix 20 mg and aldactone 50 mg daily. Check CMP now. Stop Nadolol due to side effects. Trial of propanolol 20 mg BID. May not be able to tolerate prophylaxis therapy. Instructed to call with any worsening of symptoms. Return for close follow-up in 2 weeks. Absolute avoidance of ETOH discussed with patient.

## 2014-03-08 NOTE — Assessment & Plan Note (Signed)
Due for surveillance Feb 2017.

## 2014-03-09 NOTE — Progress Notes (Signed)
cc'ed to pcp °

## 2014-03-09 NOTE — Telephone Encounter (Signed)
Pts wife- Manuela Schwartz, is aware. She is going to keep him on lasix 20mg  and aldactone 50mg 

## 2014-03-30 ENCOUNTER — Other Ambulatory Visit: Payer: Self-pay

## 2014-03-30 MED ORDER — DIPHENOXYLATE-ATROPINE 2.5-0.025 MG PO TABS: 1.0000 | ORAL_TABLET | Freq: Three times a day (TID) | ORAL | Status: DC | PRN

## 2014-04-20 ENCOUNTER — Encounter: Payer: Self-pay | Admitting: Gastroenterology

## 2014-04-20 ENCOUNTER — Other Ambulatory Visit: Payer: Self-pay

## 2014-04-20 ENCOUNTER — Ambulatory Visit (INDEPENDENT_AMBULATORY_CARE_PROVIDER_SITE_OTHER): Payer: Medicaid Other | Admitting: Gastroenterology

## 2014-04-20 VITALS — BP 95/63 | HR 76 | Temp 97.0°F | Ht 70.0 in | Wt 145.4 lb

## 2014-04-20 DIAGNOSIS — C189 Malignant neoplasm of colon, unspecified: Secondary | ICD-10-CM

## 2014-04-20 DIAGNOSIS — R000 Tachycardia, unspecified: Secondary | ICD-10-CM

## 2014-04-20 DIAGNOSIS — N63 Unspecified lump in breast: Secondary | ICD-10-CM

## 2014-04-20 DIAGNOSIS — N6320 Unspecified lump in the left breast, unspecified quadrant: Secondary | ICD-10-CM | POA: Insufficient documentation

## 2014-04-20 DIAGNOSIS — K7030 Alcoholic cirrhosis of liver without ascites: Secondary | ICD-10-CM

## 2014-04-20 MED ORDER — LACTULOSE 10 GM/15ML PO SOLN: 10.0000 g | Freq: Every day | ORAL | Status: AC | PRN

## 2014-04-20 NOTE — Assessment & Plan Note (Signed)
Hard, non-fixed mass-like structure left breast, to left of areola. Proceed with ultrasound.

## 2014-04-20 NOTE — Patient Instructions (Signed)
Stop Lasix and aldactone for now.   Start taking lactulose each evening with a goal of 3 soft bowel movements a day. Hold if diarrhea.   I have referred you to cardiology.   I have also ordered a repeat MRI and an ultrasound of the left breast.

## 2014-04-20 NOTE — Progress Notes (Signed)
Referring Provider: Lanette Hampshire, MD Primary Care Physician:  Lanette Hampshire, MD  Primary GI: Dr. Gala Romney   Chief Complaint  Patient presents with  . Follow-up    HPI:   Peter [name] is a 64 y.o. male presenting today with a history of ETOH cirrhosis, colon cancer, up-to-date on colonoscopy surveillance with next due in Feb 2017. Last EGD done 08/06/12 and showed non-critical Schatzki's ring, grade 1 esophageal varices, mild erosive esophagitis, hiatal hernia, portal gastropathy, and gastric erythema which was biopsied. Both the stomach and esophageal biopsies were benign.  Due for repeat MRI now due to renal lesions. Diuretics recently reduced to 20 mg Lasix and 50 mg aldactone daily. Trial of propanolol 20 mg BID started Jan 2016. Unable to tolerate propanolol due to hypotension. Takes Lasix and aldactone intermittently. Overall, no issues with significant lower extremity edema or ascites. States occasionally feels light-headed and dizzy. Sometimes confused. Wife states he talks a lot at night, staying up all night. Wife states he is "different" in the last month. Appetite up and down. HR 120s a few weeks ago at rest. This morning was in the low 100s at rest. Noted substernal chest discomfort a few weeks ago but now resolved. Intermittent constipation. No longer taking Bentyl. Normally BM 2 times per day. Drinks 8 oz liquor daily. Complains of left breast lump.   Past Medical History  Diagnosis Date  . Gout   . GERD (gastroesophageal reflux disease)   . Cirrhosis     ?ETOH related, afp 02/05/12= 4.9, ct on 10/16/11,no  Hep A/B vaccines  . Colon cancer Dec 2011    Cecum  . ARF (acute renal failure)     hospitalization Jan 2012  . H/O ETOH abuse   . 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 S/P endoscopy Dec 2011    Schatzki's ring, Grade 1 esophageal varices, antral/body erosions  . Anemia of chronic disease 08/10/2010  . Renal  insufficiency 10/24/2010  . Schatzki's ring   . Varices, esophageal   . Gastroparesis 01/2013    Past Surgical History  Procedure Laterality Date  . Colon surgery  02/24/2010    colon cancer (cecum)  . Exploratory laparotomy  03/03/2010    anastomotic leak, developed EC fistula  . Exploratory laparotomy w/ bowel resection  04/18/2010    ileostomy placed, (hx of EC fistula, anastomotic leak). About two feet of ileum removed.  . Colostomy    . Percutaneous drainage of intraabdominal abscess  07/2010 and 09/2010  . Partial colectomy  10/30/2010    Procedure: PARTIAL COLECTOMY;  Surgeon: Jamesetta So;  Location: [facility];  Service: General;  Laterality: N/A;  . Ileostomy closure  10/30/2010    Procedure: ILEOSTOMY TAKEDOWN;  Surgeon: Jamesetta So;  Location: [facility];  Service: General;  Laterality: N/A;  . Colonoscopy  Dec 2011    cecal mass, tubulovillous adenoma at splenic flexure  . Esophagogastroduodenoscopy  Dec 2011    Schatzki's ring, Grade 1 esophageal varices, antral/body erosions  . Colonoscopy N/A 04/02/2012    EZM:OQHUTM residual rectal and colonic mucosa. Next colonoscopy in 03/2015.  Marland Kitchen Esophagogastroduodenoscopy N/A 08/06/2012    RMR: Grade 1 esophageal varices. Abnormal distal esophageal mucosa-3 short segment Barrett's-like appearance but negative biopsy for barrett's. mild erosive reflux esophagitis, non-critical Schatzki's ring, hiatal hernia, portal gastropathy.   . Esophageal banding N/A 08/06/2012    no banding   . Wound exploration N/A  02/25/2013    Procedure: EXCISION OF SUTURE GRANULOMA ABDOMINAL WALL;  Surgeon: Jamesetta So, MD;  Location: [facility];  Service: General;  Laterality: N/A;  . Prostate biopsy  08/14/13    Current Outpatient Prescriptions  Medication Sig Dispense Refill  . ALPRAZolam (XANAX) 0.5 MG tablet every 4 (four) hours as needed for anxiety.   3  . esomeprazole (NEXIUM) 40 MG capsule Take 1 capsule (40 mg total) by mouth 2 (two) times daily before a meal.  60 capsule 5  . furosemide (LASIX) 20 MG tablet Take 1 tablet (20 mg total) by mouth daily. Along with aldactone 50 mg daily 30 tablet 5  . megestrol ([name]) 400 MG/10ML suspension Take 20 mLs (800 mg total) by mouth daily. 240 mL 3  . Pancrelipase, Lip-Prot-Amyl, (CREON) 24000 UNITS CPEP Take 1 capsule (24,000 Units total) by mouth 3 (three) times daily with meals. 180 capsule 6  . Probiotic Product (PROBIOTIC DAILY PO) Take 1 tablet by mouth daily.    . promethazine (PHENERGAN) 25 MG tablet Take 1 tablet (25 mg total) by mouth every 6 (six) hours as needed for nausea or vomiting. 15 tablet 0  . propranolol (INDERAL) 20 MG tablet Take 1 tablet (20 mg total) by mouth 2 (two) times daily. 60 tablet 3  . spironolactone (ALDACTONE) 50 MG tablet Take 2 tablets (100 mg total) by mouth daily. 60 tablet 3  . traMADol (ULTRAM) 50 MG tablet Take 1 or 2 tablets up to every 6 hours as needed for pain 30 tablet 5  . lactulose (CHRONULAC) 10 GM/15ML solution Take 15 mLs (10 g total) by mouth daily as needed for mild constipation. Goal of 3 soft bowel movements a day 240 mL 3   No current facility-administered medications for this visit.    Allergies as of 04/20/2014 - Review Complete 04/20/2014  Allergen Reaction Noted  . Ace inhibitors  02/25/2013  . Lorazepam Other (See Comments) 08/01/2010  . Percocet [oxycodone-acetaminophen] Itching and Nausea Only 08/01/2010    Family History  Problem Relation Age of Onset  . Cirrhosis Father     deceased, secondary to ETOH  . Colon cancer Neg Hx     History   Social History  . Marital Status: Married    Spouse Name: N/A  . Number of Children: N/A  . Years of Education: N/A   Social History Main Topics  . Smoking status: Never Smoker   . Smokeless tobacco: Current User    Types: Chew     Comment: Never smoked/ chews tobacco some  . Alcohol Use: 1.2 oz/week    2 Shots of liquor per week     Comment: daily  . Drug Use: No  . Sexual Activity: Yes      Birth Control/ Protection: None   Other Topics Concern  . None   Social History Narrative    Review of Systems: As mentioned in HPI.   Physical Exam: BP 95/63 mmHg  Pulse 76  Temp(Src) 97 F (36.1 C)  Ht 5\' 10"  (1.778 m)  Wt 145 lb 6.4 oz (65.953 kg)  BMI 20.86 kg/m2 General:   Alert and oriented. Appears chronically ill.  Head:  Normocephalic and atraumatic. Eyes:  Mild scleral icterus Mouth:  Oral mucosa pink and moist. Abdomen:  +BS, soft, no TTP. Liver margin easily palpable several fingerbreadths below right costal margin.  Extremities:  Without edema. Neurologic:  Alert and  oriented x4 Skin:  Mild jaundice Psych:  Alert and cooperative. Normal  mood and affect.  MRI 01/2014:  IMPRESSION: Interval resolution of 2 of the indeterminate liver lesion seen previously with the remaining 2 lesions stable in the interval. These imaging findings are reassuring. Followup MRI in 3 months recommended to re-evaluate, given the patient's history of colon cancer.  Lab Results  Component Value Date   ALT 16 03/03/2014   AST 44* 03/03/2014   ALKPHOS 129* 03/03/2014   BILITOT 2.7* 03/03/2014   Lab Results  Component Value Date   CREATININE 1.11 03/03/2014   BUN 11 03/03/2014   NA 136 03/03/2014   K 3.9 03/03/2014   CL 100 03/03/2014   CO2 25 03/03/2014   Lab Results  Component Value Date   WBC 7.7 01/29/2014   HGB 10.6* 01/29/2014   HCT 30.7* 01/29/2014   MCV 102.0* 01/29/2014   PLT 131* 01/29/2014

## 2014-04-20 NOTE — Assessment & Plan Note (Signed)
Self-reported incidences of tachycardia in the low 100s, 120s per patient and wife. Noted while at rest. Associated chest discomfort several weeks ago. No distress during visit today. With symptomatic hypotension, reports of tachycardia, refer to cardiology for further evaluation.

## 2014-04-20 NOTE — Assessment & Plan Note (Signed)
Surveillance due 2017.  

## 2014-04-20 NOTE — Assessment & Plan Note (Signed)
64 year old male with continued alcohol consumption daily despite multiple discussions of adverse effects in the setting of cirrhosis to include death. Unable to tolerate Nadolol or propanolol well (for variceal prophylaxis) due to hypotension. Known Grade 1 varices on EGD June 2014. If unable to tolerate non-selective beta blocker, will need serial surveillance of varices. Due for repeat MRI now secondary to liver lesions. Clinically, appears to be deteriorating slowly with reports of fatigue, waxing/waning appetite, mild confusion per wife, persistent ETOH intake. MELD 16. I spent at least 30 minutes with patient and wife discussing the nature of liver disease and the grim prognosis if he continues to drink. He is fully aware of this.   MRI now Hold lasix and aldactone for now; may need to resume low dose if recurrent edema Continue to follow strict low sodium diet Start lactulose at one dose daily as he is already have 2 bowel movements daily. Discussed goal of 3 soft bowel movements a day.  Close interval follow-up in our office Cardiology consult (see tachycardia)

## 2014-04-21 NOTE — Progress Notes (Signed)
cc'ed to pcp °

## 2014-04-22 ENCOUNTER — Telehealth: Payer: Self-pay | Admitting: Internal Medicine

## 2014-04-22 NOTE — Telephone Encounter (Signed)
Patient on march recall for mri

## 2014-04-22 NOTE — Telephone Encounter (Signed)
Pt had office visit on 04/20/2014. Pre cert is pending

## 2014-04-29 NOTE — Telephone Encounter (Signed)
Insurance [place] # [id number] after Peer to Peer review

## 2014-04-30 ENCOUNTER — Encounter (HOSPITAL_COMMUNITY): Payer: Medicaid Other | Attending: Hematology and Oncology | Admitting: Hematology & Oncology

## 2014-04-30 ENCOUNTER — Encounter (HOSPITAL_COMMUNITY): Payer: Self-pay | Admitting: Hematology & Oncology

## 2014-04-30 ENCOUNTER — Encounter (HOSPITAL_COMMUNITY): Payer: Medicaid Other

## 2014-04-30 VITALS — BP 109/74 | HR 105 | Temp 98.0°F | Resp 18 | Wt 150.3 lb

## 2014-04-30 DIAGNOSIS — D7589 Other specified diseases of blood and blood-forming organs: Secondary | ICD-10-CM

## 2014-04-30 DIAGNOSIS — I8390 Asymptomatic varicose veins of unspecified lower extremity: Secondary | ICD-10-CM

## 2014-04-30 DIAGNOSIS — D731 Hypersplenism: Secondary | ICD-10-CM | POA: Insufficient documentation

## 2014-04-30 DIAGNOSIS — C61 Malignant neoplasm of prostate: Secondary | ICD-10-CM

## 2014-04-30 DIAGNOSIS — C189 Malignant neoplasm of colon, unspecified: Secondary | ICD-10-CM

## 2014-04-30 DIAGNOSIS — K7030 Alcoholic cirrhosis of liver without ascites: Secondary | ICD-10-CM

## 2014-04-30 DIAGNOSIS — D61818 Other pancytopenia: Secondary | ICD-10-CM | POA: Diagnosis not present

## 2014-04-30 DIAGNOSIS — N62 Hypertrophy of breast: Secondary | ICD-10-CM

## 2014-04-30 DIAGNOSIS — K7460 Unspecified cirrhosis of liver: Secondary | ICD-10-CM | POA: Diagnosis present

## 2014-04-30 DIAGNOSIS — K769 Liver disease, unspecified: Secondary | ICD-10-CM

## 2014-04-30 DIAGNOSIS — Z85038 Personal history of other malignant neoplasm of large intestine: Secondary | ICD-10-CM | POA: Diagnosis not present

## 2014-04-30 LAB — CBC WITH DIFFERENTIAL/PLATELET
BASOS ABS: 0 10*3/uL (ref 0.0–0.1)
Basophils Relative: 0 % (ref 0–1)
Eosinophils Absolute: 0.1 10*3/uL (ref 0.0–0.7)
Eosinophils Relative: 2 % (ref 0–5)
HCT: 31.7 % — ABNORMAL LOW (ref 39.0–52.0)
Hemoglobin: 10.7 g/dL — ABNORMAL LOW (ref 13.0–17.0)
LYMPHS ABS: 1.6 10*3/uL (ref 0.7–4.0)
LYMPHS PCT: 24 % (ref 12–46)
MCH: 35.4 pg — ABNORMAL HIGH (ref 26.0–34.0)
MCHC: 33.8 g/dL (ref 30.0–36.0)
MCV: 105 fL — ABNORMAL HIGH (ref 78.0–100.0)
Monocytes Absolute: 0.5 10*3/uL (ref 0.1–1.0)
Monocytes Relative: 8 % (ref 3–12)
NEUTROS PCT: 66 % (ref 43–77)
Neutro Abs: 4.5 10*3/uL (ref 1.7–7.7)
PLATELETS: 78 10*3/uL — AB (ref 150–400)
RBC: 3.02 MIL/uL — AB (ref 4.22–5.81)
RDW: 16.2 % — AB (ref 11.5–15.5)
WBC: 6.8 10*3/uL (ref 4.0–10.5)

## 2014-04-30 LAB — COMPREHENSIVE METABOLIC PANEL
ALK PHOS: 108 U/L (ref 39–117)
ALT: 20 U/L (ref 0–53)
AST: 61 U/L — AB (ref 0–37)
Albumin: 2.9 g/dL — ABNORMAL LOW (ref 3.5–5.2)
Anion gap: 6 (ref 5–15)
BUN: 7 mg/dL (ref 6–23)
CALCIUM: 8.2 mg/dL — AB (ref 8.4–10.5)
CHLORIDE: 103 mmol/L (ref 96–112)
CO2: 27 mmol/L (ref 19–32)
Creatinine, Ser: 1.07 mg/dL (ref 0.50–1.35)
GFR calc Af Amer: 85 mL/min — ABNORMAL LOW (ref >90)
GFR, EST NON AFRICAN AMERICAN: 73 mL/min — AB (ref >90)
Glucose, Bld: 94 mg/dL (ref 70–99)
Potassium: 3.4 mmol/L — ABNORMAL LOW (ref 3.5–5.1)
Sodium: 136 mmol/L (ref 135–145)
Total Bilirubin: 4.1 mg/dL — ABNORMAL HIGH (ref 0.3–1.2)
Total Protein: 6.3 g/dL (ref 6.0–8.3)

## 2014-04-30 NOTE — Patient Instructions (Addendum)
[place] at [facility]  Discharge Instructions:  You saw Dr Whitney Muse today.  Follow up with labs and doctors appt in 2 months.  We scheduled your mammograms for 05/06/2014.  Please call the clinic if you have any questions or concerns. _______________________________________________________________  Thank you for choosing [place] at [facility] to provide your oncology and hematology care.  To afford each patient quality time with our providers, please arrive at least 15 minutes before your scheduled appointment.  You need to re-schedule your appointment if you arrive 10 or more minutes late.  We strive to give you quality time with our providers, and arriving late affects you and other patients whose appointments are after yours.  Also, if you no show three or more times for appointments you may be dismissed from the clinic.  Again, thank you for choosing [place] at [facility] hope is that these requests will allow you access to exceptional care and in a timely manner. _______________________________________________________________  If you have questions after your visit, please contact our office at [phone] between the hours of 8:30 a.m. and 5:00 p.m. Voicemails left after 4:30 p.m. will not be returned until the following business day. _______________________________________________________________  For prescription refill requests, have your pharmacy contact our office. _______________________________________________________________  Recommendations made by the consultant and any test results will be sent to your referring physician. _______________________________________________________________

## 2014-04-30 NOTE — Progress Notes (Signed)
Peter [name]'s reason for visit today is for labs as scheduled per MD orders.  Venipuncture performed with a 23 gauge butterfly needle to R Antecubital.  Peter [name] tolerated procedure well and without incident; questions were answered and patient was discharged.

## 2014-04-30 NOTE — Progress Notes (Signed)
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 male returns for follow-up of multiple medical issues. 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 He has persistent elevation in his CEA which is felt to be secondary to his liver disease. He also has a history of several hepatic lesions that are stable.  He was diagnosed with prostate cancer last year but based on final pathology observation is recommended.  He complains today of fatigue and also that he has developed "breasts." He complains of tenderness specifically in the left side. He also feels something unusual and worries him that he could have breast cancer.  MEDICAL HISTORY: Past Medical History  Diagnosis Date  . Gout   . GERD (gastroesophageal reflux disease)   . Cirrhosis     ETOH related, AFP 02/05/12 = 4.9, CT on 10/16/11, no Hep A/B vaccines  . Colon cancer Dec 2011    Cecum  . ARF (acute renal failure)     Hospitalization Jan 2012  . H/O ETOH abuse   . S/P colonoscopy Dec 2011    Cecal mass, tubulovillous adenoma at splenic flexure  . S/P endoscopy Dec 2011    Schatzki's ring, Grade 1 esophageal varices, antral/body erosions  . Anemia of chronic disease 08/10/2010  . Renal insufficiency 10/24/2010  . Schatzki's ring   . Varices,  esophageal   . Gastroparesis 71/6967    has Alcoholic cirrhosis of liver; EPIGASTRIC PAIN; TRANSAMINASES, SERUM, ELEVATED; Personal history of colon cancer; Dehydration; Colon cancer; Anemia of chronic disease; Diarrhea; Weight loss, abnormal; Renal insufficiency; Retroperitoneal lymphadenopathy; Esophageal varices without bleeding; Chronic diarrhea; GERD (gastroesophageal reflux disease); Nausea alone; Hypersplenism syndrome; Thrombocytopenia due to hypersplenism; Elevated PSA; Prostate cancer; Breast mass, left; and Tachycardia on his problem list.     is allergic to ace inhibitors; lorazepam; and percocet.  Mr. [name] does not currently have medications on file.  SURGICAL HISTORY: Past Surgical History  Procedure Laterality Date  . Colon surgery  02/24/2010    colon cancer (cecum)  . Exploratory laparotomy  03/03/2010    anastomotic leak, developed EC fistula  . Exploratory laparotomy w/ bowel resection  04/18/2010    ileostomy placed, (hx of EC fistula, anastomotic leak). About two feet of ileum removed.  . Colostomy    . Percutaneous drainage of intraabdominal abscess  07/2010 and 09/2010  . Partial colectomy  10/30/2010    Procedure: PARTIAL COLECTOMY;  Surgeon: Jamesetta So;  Location: [facility];  Service: General;  Laterality: N/A;  . Ileostomy closure  10/30/2010    Procedure: ILEOSTOMY TAKEDOWN;  Surgeon: Jamesetta So;  Location: [facility];  Service: General;  Laterality: N/A;  . Colonoscopy  Dec 2011    cecal mass, tubulovillous adenoma at splenic flexure  . Esophagogastroduodenoscopy  Dec 2011    Schatzki's ring,  Grade 1 esophageal varices, antral/body erosions  . Colonoscopy N/A 04/02/2012    DJS:[id number] residual rectal and colonic mucosa. Next colonoscopy in 03/2015.  Marland Kitchen Esophagogastroduodenoscopy N/A 08/06/2012    RMR: Grade 1 esophageal varices. Abnormal distal esophageal mucosa-3 short segment Barrett's-like appearance but negative biopsy for barrett's. mild erosive reflux  esophagitis, non-critical Schatzki's ring, hiatal hernia, portal gastropathy.   . Esophageal banding N/A 08/06/2012    no banding   . Wound exploration N/A 02/25/2013    Procedure: EXCISION OF SUTURE GRANULOMA ABDOMINAL WALL;  Surgeon: Jamesetta So, MD;  Location: [facility];  Service: General;  Laterality: N/A;  . Prostate biopsy  08/14/13    SOCIAL HISTORY: History   Social History  . Marital Status: Married    Spouse Name: N/A  . Number of Children: N/A  . Years of Education: N/A   Occupational History  . Not on file.   Social History Main Topics  . Smoking status: Never Smoker   . Smokeless tobacco: Current User    Types: Chew     Comment: Never smoked/ chews tobacco some  . Alcohol Use: 1.2 oz/week    2 Shots of liquor per week     Comment: Daily  . Drug Use: No  . Sexual Activity: Yes    Birth Control/ Protection: None   Other Topics Concern  . Not on file   Social History Narrative    FAMILY HISTORY: Family History  Problem Relation Age of Onset  . Cirrhosis Father     Deceased, secondary to ETOH  . Colon cancer Neg Hx     Review of Systems  Constitutional: Positive for malaise/fatigue.  HENT: Negative.   Eyes: Negative.   Respiratory: Negative.   Cardiovascular: Negative.   Gastrointestinal: Positive for heartburn.       Abominal bloating  Genitourinary: Negative.   Musculoskeletal: Positive for myalgias and joint pain.  Skin: Negative.   Neurological: Positive for weakness. Negative for dizziness, tingling, tremors, sensory change, speech change, focal weakness, seizures and loss of consciousness.  Endo/Heme/Allergies: Negative.   Psychiatric/Behavioral: Negative.     PHYSICAL EXAMINATION  ECOG PERFORMANCE STATUS: 1 - Symptomatic but completely ambulatory  Filed Vitals:   04/30/14 1400  BP: 109/74  Pulse: 105  Temp: 98 F (36.7 C)  Resp: 18    Physical Exam  Constitutional: He is oriented to person, place, and time and well-developed,  well-nourished, and in no distress.  thin  HENT:  Head: Normocephalic and atraumatic.  Nose: Nose normal.  Mouth/Throat: Oropharynx is clear and moist. No oropharyngeal exudate.  Eyes: Conjunctivae and EOM are normal. Pupils are equal, round, and reactive to light. Right eye exhibits no discharge. Left eye exhibits no discharge. No scleral icterus.  Neck: Normal range of motion. Neck supple. No tracheal deviation present. No thyromegaly present.  Cardiovascular: Normal rate, regular rhythm and normal heart sounds.  Exam reveals no gallop and no friction rub.   No murmur heard. Pulmonary/Chest: Effort normal and breath sounds normal. He has no wheezes. He has no rales.  Bilateral gynecomastia, symmetrical  Abdominal: Soft. Bowel sounds are normal. He exhibits distension. He exhibits no mass. There is no tenderness. There is no rebound and no guarding.  Musculoskeletal: Normal range of motion. He exhibits no edema.  Lymphadenopathy:    He has no cervical adenopathy.  Neurological: He is alert and oriented to person, place, and time. He has normal reflexes. No cranial nerve deficit. Gait normal. Coordination normal.  Skin: Skin  is warm and dry. No rash noted.  Psychiatric: Mood, memory, affect and judgment normal.  Nursing note and vitals reviewed.   LABORATORY DATA:  CBC    Component Value Date/Time   WBC 6.8 04/30/2014 1430   RBC 3.02* 04/30/2014 1430   RBC 4.07* 09/04/2010 1400   HGB 10.7* 04/30/2014 1430   HCT 31.7* 04/30/2014 1430   PLT 78* 04/30/2014 1430   MCV 105.0* 04/30/2014 1430   MCH 35.4* 04/30/2014 1430   MCHC 33.8 04/30/2014 1430   RDW 16.2* 04/30/2014 1430   LYMPHSABS 1.6 04/30/2014 1430   MONOABS 0.5 04/30/2014 1430   EOSABS 0.1 04/30/2014 1430   BASOSABS 0.0 04/30/2014 1430   CMP     Component Value Date/Time   NA 136 04/30/2014 1430   K 3.4* 04/30/2014 1430   CL 103 04/30/2014 1430   CO2 27 04/30/2014 1430   GLUCOSE 94 04/30/2014 1430   BUN 7  04/30/2014 1430   CREATININE 1.07 04/30/2014 1430   CREATININE 1.11 03/03/2014 1529   CALCIUM 8.2* 04/30/2014 1430   PROT 6.3 04/30/2014 1430   ALBUMIN 2.9* 04/30/2014 1430   AST 61* 04/30/2014 1430   ALT 20 04/30/2014 1430   ALKPHOS 108 04/30/2014 1430   BILITOT 4.1* 04/30/2014 1430   GFRNONAA 73* 04/30/2014 1430   GFRAA 85* 04/30/2014 1430       ASSESSMENT and THERAPY PLAN:   Pancyopenia Macrocytic anemia with normal B12 and folate in the distant past Gynecomastia History of Stage I CRC ETOH induced cirrhosis   I suspect his macrocytosis is secondary to his ongoing alcohol use. It has been some time since a B12 and folate have been checked and we can recheck these moving forward.  Discussed with him the causes of gynecomastia and cirrhotic liver disease. I have recommended a mammogram and the patient agrees.  He has no obvious evidence of recurrent colon cancer. He is followed very closely by GI in regards to his liver, liver masses, and resultant difficulties from his cirrhosis.  We will see him back again in several months and continue ongoing observation of his blood counts. We will call him with the results of his mammogram when available.  All questions were answered. The patient knows to call the clinic with any problems, questions or concerns. We can certainly see the patient much sooner if necessary. This note was electronically signed. Molli Hazard 05/27/2014

## 2014-04-30 NOTE — Progress Notes (Signed)
See office visit for documentation 

## 2014-05-01 LAB — FERRITIN: FERRITIN: 676 ng/mL — AB (ref 22–322)

## 2014-05-04 ENCOUNTER — Telehealth: Payer: Self-pay | Admitting: *Deleted

## 2014-05-04 NOTE — Telephone Encounter (Signed)
Error

## 2014-05-05 ENCOUNTER — Ambulatory Visit (INDEPENDENT_AMBULATORY_CARE_PROVIDER_SITE_OTHER): Payer: Medicaid Other | Admitting: Cardiology

## 2014-05-05 ENCOUNTER — Encounter: Payer: Self-pay | Admitting: Cardiology

## 2014-05-05 VITALS — BP 108/74 | HR 92 | Ht 70.0 in | Wt 152.0 lb

## 2014-05-05 DIAGNOSIS — R0602 Shortness of breath: Secondary | ICD-10-CM | POA: Diagnosis not present

## 2014-05-05 DIAGNOSIS — R9431 Abnormal electrocardiogram [ECG] [EKG]: Secondary | ICD-10-CM | POA: Diagnosis not present

## 2014-05-05 DIAGNOSIS — K7031 Alcoholic cirrhosis of liver with ascites: Secondary | ICD-10-CM

## 2014-05-05 DIAGNOSIS — R000 Tachycardia, unspecified: Secondary | ICD-10-CM | POA: Diagnosis not present

## 2014-05-05 NOTE — Progress Notes (Signed)
Cardiology Office Note  Date: 05/05/2014   ID: Peter [name], DOB 07-Mar-1950, MRN [id number]  PCP: Lanette Hampshire, MD  Primary Cardiologist: Rozann Lesches, MD   Chief Complaint  Patient presents with  . Intermittent tachycardia    History of Present Illness: Peter [name] is a medically complex and chronically ill 64 y.o. male referred for cardiology consultation by Ms. Sams NP with gastroenterology. I reviewed the chart. Patient has a history of cirrhosis with continued alcohol consumption, grade 1 esophageal varices, history of ascites, and limited medical therapy related to relatively low blood pressure at baseline.  He is here with his wife today. We discussed his overall status. His wife checks his blood pressure and heart rate each day, and helps him with his medications. They have noticed that he is not able to take propranolol, Lasix, and Aldactone on a daily basis because it generally makes him feel worse and is related to lower blood pressure and heart rate. They use his medications intermittently based on the amount of abdominal swelling and leg swelling, as well as at times an increase in his heart rate in the 100-110 range. He is not describing a definite sense of palpitations or a sudden change in heart rate. He is functionally fairly limited at baseline.  He has no history of cardiomyopathy, although long-standing alcohol abuse. He has not had a previous echocardiogram. There is no defined history of cardiac arrhythmia. Tracing today shows normal sinus rhythm at 92 bpm with low voltage and nonspecific T-wave changes.  Past Medical History  Diagnosis Date  . Gout   . GERD (gastroesophageal reflux disease)   . Cirrhosis     ETOH related, AFP 02/05/12 = 4.9, CT on 10/16/11, no Hep A/B vaccines  . Colon cancer Dec 2011    Cecum  . ARF (acute renal failure)     Hospitalization Jan 2012  . H/O ETOH abuse   . S/P colonoscopy Dec 2011    Cecal mass, tubulovillous  adenoma at splenic flexure  . S/P endoscopy Dec 2011    Schatzki's ring, Grade 1 esophageal varices, antral/body erosions  . Anemia of chronic disease 08/10/2010  . Renal insufficiency 10/24/2010  . Schatzki's ring   . Varices, esophageal   . Gastroparesis 01/2013    Past Surgical History  Procedure Laterality Date  . Colon surgery  02/24/2010    colon cancer (cecum)  . Exploratory laparotomy  03/03/2010    anastomotic leak, developed EC fistula  . Exploratory laparotomy w/ bowel resection  04/18/2010    ileostomy placed, (hx of EC fistula, anastomotic leak). About two feet of ileum removed.  . Colostomy    . Percutaneous drainage of intraabdominal abscess  07/2010 and 09/2010  . Partial colectomy  10/30/2010    Procedure: PARTIAL COLECTOMY;  Surgeon: Jamesetta So;  Location: [facility];  Service: General;  Laterality: N/A;  . Ileostomy closure  10/30/2010    Procedure: ILEOSTOMY TAKEDOWN;  Surgeon: Jamesetta So;  Location: [facility];  Service: General;  Laterality: N/A;  . Colonoscopy  Dec 2011    cecal mass, tubulovillous adenoma at splenic flexure  . Esophagogastroduodenoscopy  Dec 2011    Schatzki's ring, Grade 1 esophageal varices, antral/body erosions  . Colonoscopy N/A 04/02/2012    [id number]:[id number] residual rectal and colonic mucosa. Next colonoscopy in 03/2015.  Marland Kitchen Esophagogastroduodenoscopy N/A 08/06/2012    RMR: Grade 1 esophageal varices. Abnormal distal esophageal mucosa-3 short segment Barrett's-like appearance but negative biopsy for barrett's.  mild erosive reflux esophagitis, non-critical Schatzki's ring, hiatal hernia, portal gastropathy.   . Esophageal banding N/A 08/06/2012    no banding   . Wound exploration N/A 02/25/2013    Procedure: EXCISION OF SUTURE GRANULOMA ABDOMINAL WALL;  Surgeon: Jamesetta So, MD;  Location: [facility];  Service: General;  Laterality: N/A;  . Prostate biopsy  08/14/13    Current Outpatient Prescriptions  Medication Sig Dispense Refill  . ALPRAZolam (XANAX)  0.5 MG tablet every 4 (four) hours as needed for anxiety.   3  . diphenoxylate-atropine (LOMOTIL) 2.5-0.025 MG per tablet   1  . furosemide (LASIX) 20 MG tablet Take 1 tablet (20 mg total) by mouth daily. Along with aldactone 50 mg daily 30 tablet 5  . lactulose (CHRONULAC) 10 GM/15ML solution Take 15 mLs (10 g total) by mouth daily as needed for mild constipation. Goal of 3 soft bowel movements a day 240 mL 3  . megestrol ([name]) 400 MG/10ML suspension Take 20 mLs (800 mg total) by mouth daily. 240 mL 3  . Pancrelipase, Lip-Prot-Amyl, (CREON) 24000 UNITS CPEP Take 1 capsule (24,000 Units total) by mouth 3 (three) times daily with meals. 180 capsule 6  . promethazine (PHENERGAN) 25 MG tablet Take 1 tablet (25 mg total) by mouth every 6 (six) hours as needed for nausea or vomiting. 15 tablet 0  . propranolol (INDERAL) 20 MG tablet Take 1 tablet (20 mg total) by mouth 2 (two) times daily. 60 tablet 3  . ranitidine (ZANTAC) 150 MG tablet Take 150 mg by mouth 2 (two) times daily.    Marland Kitchen spironolactone (ALDACTONE) 50 MG tablet Take 2 tablets (100 mg total) by mouth daily. 60 tablet 3  . tamsulosin (FLOMAX) 0.4 MG CAPS capsule Take 0.4 mg by mouth.    . traMADol (ULTRAM) 50 MG tablet Take 1 or 2 tablets up to every 6 hours as needed for pain 30 tablet 5   No current facility-administered medications for this visit.    Allergies:  Ace inhibitors; Lorazepam; and Percocet   Social History: The patient  reports that he has never smoked. His smokeless tobacco use includes Chew. He reports that he drinks about 1.2 oz of alcohol per week. He reports that he does not use illicit drugs.   Family History: The patient's family history includes Cirrhosis in his father. There is no history of Colon cancer.   ROS:  Please see the history of present illness. Otherwise, complete review of systems is positive for swelling and firm area in region of left breast - ultrasound pending.  All other systems are reviewed and  negative.    Physical Exam: VS:  BP 108/74 mmHg  Pulse 92  Ht 5\' 10"  (1.778 m)  Wt 152 lb (68.947 kg)  BMI 21.81 kg/m2  SpO2 98%, BMI Body mass index is 21.81 kg/(m^2).  Wt Readings from Last 3 Encounters:  05/05/14 152 lb (68.947 kg)  04/30/14 150 lb 4.8 oz (68.176 kg)  04/20/14 145 lb 6.4 oz (65.953 kg)     General: Chronically ill-appearing male in no distress. HEENT: Mild scleral icterus, lids normal, oropharynx clear. Neck: Supple, no elevated JVP or carotid bruits, no thyromegaly. Lungs: Clear to auscultation, nonlabored breathing at rest. Cardiac: Regular rate and rhythm, S4, no significant systolic murmur, no pericardial rub. Abdomen: Soft, nontender, hepatomegaly, bowel sounds present, no guarding or rebound. Extremities: 1-2+ lower leg and ankle edema, distal pulses 1-2+. Skin: Warm and dry. Musculoskeletal: No kyphosis. Neuropsychiatric: Alert and oriented x3,  affect grossly appropriate. Slow deliberate speech. No nystagmus.   ECG: ECG is ordered today and reviewed showing sinus rhythm with low voltage and nonspecific T-wave changes.   Recent Labwork: 11/25/2013: TSH 3.760 04/30/2014: ALT 20; AST 61*; BUN 7; Creatinine 1.07; Hemoglobin 10.7*; Platelets 78*; Potassium 3.4*; Sodium 136   Assessment and Plan:  1. Intermittent tachycardia, likely sinus tachycardia and secondary to underlying chronic conditions including relatively low blood pressure at baseline. He does not describe any sudden onset palpitations to necessarily expect associated arrhythmia. ECG shows sinus rhythm with low voltage, we will obtain an echocardiogram to rule out associated cardiomyopathy with long-standing history of alcohol abuse, and also pericardial effusion.  2. Cirrhosis with ongoing alcohol abuse associated with intermittent ascites and leg edema. Does not sound like he tolerates his regular medical regimen very well. I will of course defer to gastroenterology, but consideration could be  given to reducing propranolol to 10 mg twice daily to see if he be able to take this more regularly, and back off on diuretics to lower dose Aldactone alone. This would reduce potential for lower blood pressure and other side effects including gynecomastia.   Current medicines are reviewed at length with the patient today. No changes were made in current regimen with recommendations as outlined above.    Orders Placed This Encounter  Procedures  . EKG 12-Lead  . 2D Echocardiogram without contrast    Disposition: Call with results.   Signed, Satira Sark, MD, [facility] 05/05/2014 9:53 AM    [facility] [place], [place], [place] [address] Phone: [phone]; Fax: [phone]

## 2014-05-05 NOTE — Patient Instructions (Signed)

## 2014-05-06 ENCOUNTER — Ambulatory Visit (HOSPITAL_COMMUNITY)
Admission: RE | Admit: 2014-05-06 | Discharge: 2014-05-06 | Disposition: A | Discharge status: Home / Self Care | Payer: Medicaid Other | Source: Ambulatory Visit | Attending: Gastroenterology | Admitting: Gastroenterology

## 2014-05-06 ENCOUNTER — Ambulatory Visit (HOSPITAL_COMMUNITY)
Admission: RE | Admit: 2014-05-06 | Discharge: 2014-05-06 | Disposition: A | Discharge status: Home / Self Care | Payer: Medicaid Other | Source: Ambulatory Visit | Attending: Hematology & Oncology | Admitting: Hematology & Oncology

## 2014-05-06 ENCOUNTER — Telehealth: Payer: Self-pay | Admitting: Internal Medicine

## 2014-05-06 ENCOUNTER — Ambulatory Visit (HOSPITAL_COMMUNITY): Admission: RE | Admit: 2014-05-06 | Payer: Medicaid Other | Source: Ambulatory Visit

## 2014-05-06 DIAGNOSIS — K7030 Alcoholic cirrhosis of liver without ascites: Secondary | ICD-10-CM | POA: Insufficient documentation

## 2014-05-06 DIAGNOSIS — N6320 Unspecified lump in the left breast, unspecified quadrant: Secondary | ICD-10-CM

## 2014-05-06 DIAGNOSIS — R160 Hepatomegaly, not elsewhere classified: Secondary | ICD-10-CM | POA: Insufficient documentation

## 2014-05-06 DIAGNOSIS — R188 Other ascites: Secondary | ICD-10-CM | POA: Insufficient documentation

## 2014-05-06 DIAGNOSIS — N63 Unspecified lump in breast: Secondary | ICD-10-CM | POA: Insufficient documentation

## 2014-05-06 DIAGNOSIS — K760 Fatty (change of) liver, not elsewhere classified: Secondary | ICD-10-CM | POA: Diagnosis not present

## 2014-05-06 DIAGNOSIS — N62 Hypertrophy of breast: Secondary | ICD-10-CM

## 2014-05-06 MED ORDER — GADOBENATE DIMEGLUMINE 529 MG/ML IV SOLN
14.0000 mL | Freq: Once | INTRAVENOUS | Status: AC | PRN
  Administered 2014-05-06: 14 mL via INTRAVENOUS

## 2014-05-06 NOTE — Telephone Encounter (Signed)
RADIOLOGY CALLED AND STATED THAT Peter [name] DID NOT WANT TO HAVE THE MRI DONE AND WILL NOT RESCHEDULE IT.

## 2014-05-06 NOTE — Telephone Encounter (Signed)
Called and spoke with wife. I have asked her to have patient call me to discuss proceeding with MRI.

## 2014-05-12 ENCOUNTER — Other Ambulatory Visit: Payer: Self-pay

## 2014-05-12 ENCOUNTER — Other Ambulatory Visit (INDEPENDENT_AMBULATORY_CARE_PROVIDER_SITE_OTHER): Payer: Medicaid Other

## 2014-05-12 DIAGNOSIS — R9431 Abnormal electrocardiogram [ECG] [EKG]: Secondary | ICD-10-CM | POA: Diagnosis not present

## 2014-05-12 DIAGNOSIS — R000 Tachycardia, unspecified: Secondary | ICD-10-CM | POA: Diagnosis not present

## 2014-05-12 DIAGNOSIS — R0602 Shortness of breath: Secondary | ICD-10-CM

## 2014-05-14 ENCOUNTER — Telehealth: Payer: Self-pay | Admitting: *Deleted

## 2014-05-14 NOTE — Telephone Encounter (Signed)
-----   Message from Satira Sark, MD sent at 05/13/2014  5:19 PM EDT ----- Reviewed report. LVEF is in normal range and diastolic parameters are also normal. There are no major valvular abnormalities, and no pericardial effusion. These are reassuring findings. He does not require further cardiac workup at this time. Keep follow-up with primary care and gastroenterology.

## 2014-05-17 ENCOUNTER — Telehealth: Payer: Self-pay | Admitting: Internal Medicine

## 2014-05-17 NOTE — Progress Notes (Signed)
Quick Note:  Good news. No change in likely benign lesions. Repeat MRI in 6 months. Let's have patient return to office in next few weeks for close follow-up. ______

## 2014-05-17 NOTE — Telephone Encounter (Signed)
Returned call

## 2014-05-17 NOTE — Telephone Encounter (Signed)
Pt's wife Manuela Schwartz) called again checking to see if patient's MRI results were available. Please call her at [phone]

## 2014-05-17 NOTE — Telephone Encounter (Signed)
Pt wife made aware, forwarded to Dr. Everette Rank

## 2014-05-17 NOTE — Progress Notes (Signed)
Quick Note:  Ultrasound of breast without any areas of concern. ______

## 2014-05-17 NOTE — Telephone Encounter (Signed)
-----   Message from Satira Sark, MD sent at 05/13/2014  5:19 PM EDT ----- Reviewed report. LVEF is in normal range and diastolic parameters are also normal. There are no major valvular abnormalities, and no pericardial effusion. These are reassuring findings. He does not require further cardiac workup at this time. Keep follow-up with primary care and gastroenterology.

## 2014-05-17 NOTE — Telephone Encounter (Signed)
Please see result notes.  

## 2014-05-17 NOTE — Telephone Encounter (Signed)
Routing to AS 

## 2014-06-08 ENCOUNTER — Encounter: Payer: Self-pay | Admitting: Gastroenterology

## 2014-06-08 ENCOUNTER — Ambulatory Visit: Payer: Medicaid Other | Admitting: Gastroenterology

## 2014-06-08 ENCOUNTER — Telehealth: Payer: Self-pay | Admitting: Internal Medicine

## 2014-06-08 NOTE — Telephone Encounter (Signed)
Let's touch base with patient. I would like to keep a close eye on him.

## 2014-06-08 NOTE — Telephone Encounter (Signed)
I tried to call with no answer

## 2014-06-08 NOTE — Telephone Encounter (Signed)
PATIENT WAS A NO SHOW 06/08/14 AND LETTER WAS SENT

## 2014-06-09 NOTE — Telephone Encounter (Signed)
PATIENT COMING 06/14/14 WIFE AWARE OF DATE AND TIME

## 2014-06-09 NOTE — Telephone Encounter (Signed)
I called and spoke with the pts wife. She said she thought his appt was next Tuesday. She said it was her fault that he wasn't here.   Per wife, pt is still not eating and he is weak. He is able to tolerate fluids and a bite or two of food a day.   Please reschedule him.

## 2014-06-14 ENCOUNTER — Ambulatory Visit (INDEPENDENT_AMBULATORY_CARE_PROVIDER_SITE_OTHER): Payer: Medicaid Other | Admitting: Gastroenterology

## 2014-06-14 ENCOUNTER — Other Ambulatory Visit: Payer: Self-pay

## 2014-06-14 ENCOUNTER — Encounter: Payer: Self-pay | Admitting: Gastroenterology

## 2014-06-14 VITALS — BP 112/70 | HR 109 | Temp 98.3°F | Ht 70.0 in | Wt 148.2 lb

## 2014-06-14 DIAGNOSIS — I8500 Esophageal varices without bleeding: Secondary | ICD-10-CM | POA: Diagnosis not present

## 2014-06-14 DIAGNOSIS — Z85038 Personal history of other malignant neoplasm of large intestine: Secondary | ICD-10-CM

## 2014-06-14 DIAGNOSIS — K7031 Alcoholic cirrhosis of liver with ascites: Secondary | ICD-10-CM | POA: Diagnosis not present

## 2014-06-14 DIAGNOSIS — R6881 Early satiety: Secondary | ICD-10-CM

## 2014-06-14 DIAGNOSIS — K219 Gastro-esophageal reflux disease without esophagitis: Secondary | ICD-10-CM

## 2014-06-14 NOTE — Patient Instructions (Signed)
Stop Nexium. Start Dexilant once daily.   Please have blood work done today.   Stop lasix. Only take aldactone 1/2 tablet (25 mg) each morning.   We have scheduled you for an upper endoscopy with Dr. Gala Romney in the near future.

## 2014-06-14 NOTE — Progress Notes (Signed)
Referring Provider: Marjean Donna, MD Primary Care Physician:  Lanette Hampshire, MD  Chief Complaint  Patient presents with  . Follow-up    HPI:   Peter [name] is a 64 y.o. male presenting today with a history of ETOH cirrhosis, colon cancer, up-to-date on colonoscopy surveillance with next due in Feb 2017. Last EGD done 08/06/12 and showed non-critical Schatzki's ring, grade 1 esophageal varices, mild erosive esophagitis, hiatal hernia, portal gastropathy, and gastric erythema which was biopsied. Both the stomach and esophageal biopsies were benign.  Lasix 20 mg daily an Aldactone 50 mg daily. Not able to tolerate propanolol to 10 mg BID. Difficulty with hypotension. Takes sporadically. Cardiology saw patient due to tachycardia and felt this was likely due to underlying chronic conditions. Consideration raised for backing off on diuretics to lower dose of aldactone alone.  Eating watermelon. Not much of an appetite. Doing carnation breakfast. Gets stuck on food like pickles. Refractory reflux. Nexium once daily. Zantac 150 BID. Lack of appetite. Has heaving spells. No dysphagia. No melena. Titrating lactulose as needed. Confused this past week.   March 2015 weighed in the 160s, now 148.  Past Medical History  Diagnosis Date  . Gout   . GERD (gastroesophageal reflux disease)   . Cirrhosis     ETOH related, AFP 02/05/12 = 4.9, CT on 10/16/11, no Hep A/B vaccines  . Colon cancer Dec 2011    Cecum  . ARF (acute renal failure)     Hospitalization Jan 2012  . H/O ETOH abuse   . S/P colonoscopy Dec 2011    Cecal mass, tubulovillous adenoma at splenic flexure  . S/P endoscopy Dec 2011    Schatzki's ring, Grade 1 esophageal varices, antral/body erosions  . Anemia of chronic disease 08/10/2010  . Renal insufficiency 10/24/2010  . Schatzki's ring   . Varices, esophageal   . Gastroparesis 01/2013    Past Surgical History  Procedure Laterality Date  . Colon surgery  02/24/2010   colon cancer (cecum)  . Exploratory laparotomy  03/03/2010    anastomotic leak, developed EC fistula  . Exploratory laparotomy w/ bowel resection  04/18/2010    ileostomy placed, (hx of EC fistula, anastomotic leak). About two feet of ileum removed.  . Colostomy    . Percutaneous drainage of intraabdominal abscess  07/2010 and 09/2010  . Partial colectomy  10/30/2010    Procedure: PARTIAL COLECTOMY;  Surgeon: Jamesetta So;  Location: [facility];  Service: General;  Laterality: N/A;  . Ileostomy closure  10/30/2010    Procedure: ILEOSTOMY TAKEDOWN;  Surgeon: Jamesetta So;  Location: [facility];  Service: General;  Laterality: N/A;  . Colonoscopy  Dec 2011    cecal mass, tubulovillous adenoma at splenic flexure  . Esophagogastroduodenoscopy  Dec 2011    Schatzki's ring, Grade 1 esophageal varices, antral/body erosions  . Colonoscopy N/A 04/02/2012    XNA:[id number] residual rectal and colonic mucosa. Next colonoscopy in 03/2015.  Marland Kitchen Esophagogastroduodenoscopy N/A 08/06/2012    RMR: Grade 1 esophageal varices. Abnormal distal esophageal mucosa-3 short segment Barrett's-like appearance but negative biopsy for barrett's. mild erosive reflux esophagitis, non-critical Schatzki's ring, hiatal hernia, portal gastropathy.   . Esophageal banding N/A 08/06/2012    no banding   . Wound exploration N/A 02/25/2013    Procedure: EXCISION OF SUTURE GRANULOMA ABDOMINAL WALL;  Surgeon: Jamesetta So, MD;  Location: [facility];  Service: General;  Laterality: N/A;  . Prostate biopsy  08/14/13    Current  Outpatient Prescriptions  Medication Sig Dispense Refill  . ALPRAZolam (XANAX) 0.5 MG tablet every 4 (four) hours as needed for anxiety.   3  . diphenoxylate-atropine (LOMOTIL) 2.5-0.025 MG per tablet   1  . furosemide (LASIX) 20 MG tablet Take 1 tablet (20 mg total) by mouth daily. Along with aldactone 50 mg daily 30 tablet 5  . lactulose (CHRONULAC) 10 GM/15ML solution Take 15 mLs (10 g total) by mouth daily as needed for mild  constipation. Goal of 3 soft bowel movements a day 240 mL 3  . megestrol ([name]) 400 MG/10ML suspension Take 20 mLs (800 mg total) by mouth daily. 240 mL 3  . Pancrelipase, Lip-Prot-Amyl, (CREON) 24000 UNITS CPEP Take 1 capsule (24,000 Units total) by mouth 3 (three) times daily with meals. 180 capsule 6  . promethazine (PHENERGAN) 25 MG tablet Take 1 tablet (25 mg total) by mouth every 6 (six) hours as needed for nausea or vomiting. 15 tablet 0  . propranolol (INDERAL) 20 MG tablet Take 1 tablet (20 mg total) by mouth 2 (two) times daily. 60 tablet 3  . ranitidine (ZANTAC) 150 MG tablet Take 150 mg by mouth 2 (two) times daily.    Marland Kitchen spironolactone (ALDACTONE) 50 MG tablet Take 2 tablets (100 mg total) by mouth daily. 60 tablet 3  . tamsulosin (FLOMAX) 0.4 MG CAPS capsule Take 0.4 mg by mouth.    . traMADol (ULTRAM) 50 MG tablet Take 1 or 2 tablets up to every 6 hours as needed for pain 30 tablet 5   No current facility-administered medications for this visit.    Allergies as of 06/14/2014 - Review Complete 06/14/2014  Allergen Reaction Noted  . Ace inhibitors  02/25/2013  . Lorazepam Other (See Comments) 08/01/2010  . Percocet [oxycodone-acetaminophen] Itching and Nausea Only 08/01/2010    Family History  Problem Relation Age of Onset  . Cirrhosis Father     Deceased, secondary to ETOH  . Colon cancer Neg Hx     History   Social History  . Marital Status: Married    Spouse Name: N/A  . Number of Children: N/A  . Years of Education: N/A   Social History Main Topics  . Smoking status: Never Smoker   . Smokeless tobacco: Current User    Types: Chew     Comment: Never smoked/ chews tobacco some  . Alcohol Use: 1.2 oz/week    2 Shots of liquor per week     Comment: Daily  . Drug Use: No  . Sexual Activity: Yes    Birth Control/ Protection: None   Other Topics Concern  . None   Social History Narrative    Review of Systems: As mentioned in HPI  Physical Exam: BP  112/70 mmHg  Pulse 109  Temp(Src) 98.3 F (36.8 C) (Oral)  Ht 5\' 10"  (1.778 m)  Wt 148 lb 3.2 oz (67.223 kg)  BMI 21.26 kg/m2 General:   Alert and oriented. No distress noted. Appears chronically ill  Head:  Normocephalic and atraumatic. Eyes:  Mild scleral icterus Mouth:  Oral mucosa pink and moist.  Neck:  Supple, without mass or thyromegaly. Heart:  S1, S2 present with mild tachycardia Abdomen:  +BS, soft, non-tender and non-distended. No rebound or guarding. hepatomegaly Msk:  Symmetrical without gross deformities. Normal posture. Extremities:  Pedal edema  Neurologic:  Alert and  oriented x4;  grossly normal neurologically. Skin:  Intact without significant lesions or rashes. Psych:  Alert and cooperative. Normal mood and affect.

## 2014-06-15 LAB — CBC
HCT: 29.1 % — ABNORMAL LOW (ref 39.0–52.0)
HEMOGLOBIN: 10.4 g/dL — AB (ref 13.0–17.0)
MCH: 37.1 pg — ABNORMAL HIGH (ref 26.0–34.0)
MCHC: 35.7 g/dL (ref 30.0–36.0)
MCV: 103.9 fL — ABNORMAL HIGH (ref 78.0–100.0)
MPV: 9.7 fL (ref 8.6–12.4)
PLATELETS: 114 10*3/uL — AB (ref 150–400)
RBC: 2.8 MIL/uL — AB (ref 4.22–5.81)
RDW: 17.3 % — AB (ref 11.5–15.5)
WBC: 10.7 10*3/uL — AB (ref 4.0–10.5)

## 2014-06-15 LAB — PROTIME-INR
INR: 1.42 (ref <1.50)
PROTHROMBIN TIME: 17.4 s — AB (ref 11.6–15.2)

## 2014-06-15 LAB — COMPREHENSIVE METABOLIC PANEL
ALBUMIN: 2.7 g/dL — AB (ref 3.5–5.2)
ALT: 25 U/L (ref 0–53)
AST: 86 U/L — AB (ref 0–37)
Alkaline Phosphatase: 101 U/L (ref 39–117)
BILIRUBIN TOTAL: 6.5 mg/dL — AB (ref 0.2–1.2)
BUN: 11 mg/dL (ref 6–23)
CHLORIDE: 95 meq/L — AB (ref 96–112)
CO2: 25 meq/L (ref 19–32)
CREATININE: 1.32 mg/dL (ref 0.50–1.35)
Calcium: 8.1 mg/dL — ABNORMAL LOW (ref 8.4–10.5)
Glucose, Bld: 97 mg/dL (ref 70–99)
POTASSIUM: 3.9 meq/L (ref 3.5–5.3)
SODIUM: 135 meq/L (ref 135–145)
TOTAL PROTEIN: 6 g/dL (ref 6.0–8.3)

## 2014-06-17 NOTE — Assessment & Plan Note (Signed)
Surveillance colonoscopy 2017. No concerning lower GI symptoms.

## 2014-06-17 NOTE — Assessment & Plan Note (Addendum)
64 year old male with refractory GERD despite PPI daily and Zantac BID, with associated nausea, dry heaving, and notable weight loss over the past year. Loss of appetite. Last EGD in June 2014 with non-critical Schatzki's ring, grade 1 esophageal varices, mild erosive esophagitis, portal gastropathy, and gastric erythema with benign stomach and esophageal biopsies. He has been sporadically taking propanolol and unable to tolerate nadolol. Both agents have resulted in hypotension, and prophylaxis has not been consistent. With his weight loss, lack of appetite, and refractory GERD, recommend updated EGD. As he has not been able to tolerate variceal bleeding prophylaxis, EGD is also indicated for surveillance.   Proceed with upper endoscopy in the near future with Dr. Gala Romney. The risks, benefits, and alternatives have been discussed in detail with patient. They have stated understanding and desire to proceed.  Phenergan 12.5 mg IV on call  Stop Nexium. Start Dexilant once daily.

## 2014-06-17 NOTE — Assessment & Plan Note (Signed)
EGD planned for surveillance. Unable to tolerate prophylaxis.

## 2014-06-17 NOTE — Progress Notes (Signed)
CC'ED TO PCP 

## 2014-06-17 NOTE — Assessment & Plan Note (Signed)
Up-to-date on [place] screening. Persistent weight loss concerning and EGD planned in the near future. Continues to drink alcohol sporadically, despite lengthy conversations of adverse outcomes to include rapid decline and death. He has continued to slowly decline over the past year and appears to have mild jaundice at time of appointment. Will update labs now. Wife present at appointment. Unable to tolerate low-dose diuretics. Decrease to Aldactone 25 mg daily per cardiology recommendations. May need to increase to 50 mg daily. Recheck BMP in 1 week. Lactulose titrated to achieve 3 soft BMs daily. Not a candidate for transplantation consideration while still drinking. Discussed this at length as well.

## 2014-06-21 ENCOUNTER — Telehealth: Payer: Self-pay

## 2014-06-21 NOTE — Telephone Encounter (Signed)
pts wife (Peter [name]) called and states that she wants to talk to [name] about Peter [name]. States that pt seen Dr. Jeffie Pollock today and that his liver levels are elevated and he doesn't want to see anymore doctors.  Peter [name] (wife) states that she just wants to talk to [name] for some clarification on some things.

## 2014-06-22 NOTE — Telephone Encounter (Signed)
MELD 20, increased from Dec 2015 when MELD was 14. Hgb stable. Chronic macrocytic anemia in the setting of alcohol use. Bilirubin continues to rise over past few months. Imaging is up-to-date. INR improved, now normal at 1.42. Overall, patient's prognosis is poor due to persistent ETOH use in the setting of known ETOH cirrhosis. EGD as planned.  Spoke with wife at length. Patient is not getting out of bed. States he does not want to pursue any further doctor visits. Tired of fighting. Continues to drink daily. Not a candidate for transplantation due to ETOH use. Discussed with wife his downward trend in health over the past year but more specifically over past few months. Options limited at this point. Encouraged to keep EGD as planned due to dyspepsia. She will try to encourage him to go.   Almyra Free: can we recheck BMP now? If patient is willing?

## 2014-06-22 NOTE — Progress Notes (Signed)
MELD 20, increased from Dec 2015 when MELD was 14. Hgb stable. Chronic macrocytic anemia in the setting of alcohol use. Bilirubin continues to rise over past few months. Imaging is up-to-date. INR improved, now normal at 1.42. Overall, patient's prognosis is poor due to persistent ETOH use in the setting of known ETOH cirrhosis. EGD as planned.  Almyra Free, let's recheck BMP now as we have changed diuretic therapy recently.

## 2014-06-23 ENCOUNTER — Other Ambulatory Visit: Payer: Self-pay

## 2014-06-23 ENCOUNTER — Other Ambulatory Visit: Payer: Self-pay | Admitting: Gastroenterology

## 2014-06-23 DIAGNOSIS — K7031 Alcoholic cirrhosis of liver with ascites: Secondary | ICD-10-CM

## 2014-06-23 NOTE — Telephone Encounter (Signed)
Tried to call pts wife- NA- LMOM   Lab order done.

## 2014-06-24 NOTE — Telephone Encounter (Signed)
Tried to call- LM

## 2014-06-24 NOTE — Telephone Encounter (Signed)
Lab orders and letter mailed to the pt.

## 2014-06-30 ENCOUNTER — Ambulatory Visit (HOSPITAL_COMMUNITY): Payer: Medicaid Other | Admitting: Hematology & Oncology

## 2014-06-30 ENCOUNTER — Other Ambulatory Visit (HOSPITAL_COMMUNITY): Payer: Medicaid Other

## 2014-07-01 ENCOUNTER — Other Ambulatory Visit: Payer: Self-pay

## 2014-07-02 ENCOUNTER — Encounter (HOSPITAL_COMMUNITY): Payer: Self-pay

## 2014-07-02 ENCOUNTER — Encounter (HOSPITAL_COMMUNITY)
Admission: RE | Disposition: A | Discharge status: Home / Self Care | Payer: Self-pay | Source: Ambulatory Visit | Attending: Internal Medicine

## 2014-07-02 ENCOUNTER — Ambulatory Visit (HOSPITAL_COMMUNITY)
Admission: RE | Admit: 2014-07-02 | Discharge: 2014-07-02 | Disposition: A | Discharge status: Home / Self Care | Payer: Medicaid Other | Source: Ambulatory Visit | Attending: Internal Medicine | Admitting: Internal Medicine

## 2014-07-02 DIAGNOSIS — R6881 Early satiety: Secondary | ICD-10-CM

## 2014-07-02 DIAGNOSIS — Z85038 Personal history of other malignant neoplasm of large intestine: Secondary | ICD-10-CM | POA: Diagnosis not present

## 2014-07-02 DIAGNOSIS — Z888 Allergy status to other drugs, medicaments and biological substances status: Secondary | ICD-10-CM | POA: Insufficient documentation

## 2014-07-02 DIAGNOSIS — K219 Gastro-esophageal reflux disease without esophagitis: Secondary | ICD-10-CM | POA: Insufficient documentation

## 2014-07-02 DIAGNOSIS — K766 Portal hypertension: Secondary | ICD-10-CM | POA: Diagnosis not present

## 2014-07-02 DIAGNOSIS — F1010 Alcohol abuse, uncomplicated: Secondary | ICD-10-CM | POA: Diagnosis not present

## 2014-07-02 DIAGNOSIS — I8500 Esophageal varices without bleeding: Secondary | ICD-10-CM

## 2014-07-02 DIAGNOSIS — Z886 Allergy status to analgesic agent status: Secondary | ICD-10-CM | POA: Diagnosis not present

## 2014-07-02 DIAGNOSIS — K3189 Other diseases of stomach and duodenum: Secondary | ICD-10-CM | POA: Insufficient documentation

## 2014-07-02 DIAGNOSIS — Z9049 Acquired absence of other specified parts of digestive tract: Secondary | ICD-10-CM | POA: Insufficient documentation

## 2014-07-02 DIAGNOSIS — K449 Diaphragmatic hernia without obstruction or gangrene: Secondary | ICD-10-CM | POA: Diagnosis not present

## 2014-07-02 DIAGNOSIS — D638 Anemia in other chronic diseases classified elsewhere: Secondary | ICD-10-CM | POA: Diagnosis not present

## 2014-07-02 DIAGNOSIS — M109 Gout, unspecified: Secondary | ICD-10-CM | POA: Insufficient documentation

## 2014-07-02 HISTORY — PX: ESOPHAGOGASTRODUODENOSCOPY: SHX5428

## 2014-07-02 LAB — BASIC METABOLIC PANEL
Anion gap: 12 (ref 5–15)
BUN: 8 mg/dL (ref 6–20)
CHLORIDE: 101 mmol/L (ref 101–111)
CO2: 22 mmol/L (ref 22–32)
CREATININE: 0.95 mg/dL (ref 0.61–1.24)
Calcium: 8.2 mg/dL — ABNORMAL LOW (ref 8.9–10.3)
GFR calc Af Amer: >60 mL/min (ref >60)
GFR calc non Af Amer: >60 mL/min (ref >60)
Glucose, Bld: 80 mg/dL (ref 65–99)
POTASSIUM: 3.7 mmol/L (ref 3.5–5.1)
SODIUM: 135 mmol/L (ref 135–145)

## 2014-07-02 SURGERY — EGD (ESOPHAGOGASTRODUODENOSCOPY)
Anesthesia: Moderate Sedation

## 2014-07-02 MED ORDER — SODIUM CHLORIDE 0.9 % IJ SOLN
INTRAMUSCULAR | Status: AC
  Filled 2014-07-02: qty 3

## 2014-07-02 MED ORDER — SIMETHICONE 40 MG/0.6ML PO SUSP
ORAL | Status: DC | PRN
  Administered 2014-07-02: 11:00:00

## 2014-07-02 MED ORDER — MIDAZOLAM HCL 5 MG/5ML IJ SOLN
INTRAMUSCULAR | Status: DC | PRN
  Administered 2014-07-02: 2 mg via INTRAVENOUS

## 2014-07-02 MED ORDER — MIDAZOLAM HCL 5 MG/5ML IJ SOLN
INTRAMUSCULAR | Status: AC
  Filled 2014-07-02: qty 10

## 2014-07-02 MED ORDER — LIDOCAINE VISCOUS 2 % MT SOLN
OROMUCOSAL | Status: DC | PRN
  Administered 2014-07-02: 5 mL via OROMUCOSAL

## 2014-07-02 MED ORDER — MEPERIDINE HCL 100 MG/ML IJ SOLN
INTRAMUSCULAR | Status: DC | PRN
  Administered 2014-07-02: 25 mg via INTRAVENOUS

## 2014-07-02 MED ORDER — ONDANSETRON HCL 4 MG/2ML IJ SOLN
INTRAMUSCULAR | Status: AC
  Filled 2014-07-02: qty 2

## 2014-07-02 MED ORDER — PROMETHAZINE HCL 25 MG/ML IJ SOLN
12.5000 mg | Freq: Once | INTRAMUSCULAR | Status: AC
Start: 2014-07-02 — End: 2014-07-02
  Administered 2014-07-02: 12.5 mg via INTRAVENOUS

## 2014-07-02 MED ORDER — LIDOCAINE VISCOUS 2 % MT SOLN
OROMUCOSAL | Status: AC
  Filled 2014-07-02: qty 15

## 2014-07-02 MED ORDER — SODIUM CHLORIDE 0.9 % IV SOLN
INTRAVENOUS | Status: DC
  Administered 2014-07-02: 10:00:00 via INTRAVENOUS

## 2014-07-02 MED ORDER — MEPERIDINE HCL 100 MG/ML IJ SOLN
INTRAMUSCULAR | Status: AC
  Filled 2014-07-02: qty 2

## 2014-07-02 MED ORDER — PROMETHAZINE HCL 25 MG/ML IJ SOLN
INTRAMUSCULAR | Status: AC
  Filled 2014-07-02: qty 1

## 2014-07-02 MED ORDER — ONDANSETRON HCL 4 MG/2ML IJ SOLN
INTRAMUSCULAR | Status: DC | PRN
  Administered 2014-07-02: 4 mg via INTRAVENOUS

## 2014-07-02 NOTE — H&P (View-Only) (Signed)
Referring Provider: Marjean Donna, MD Primary Care Physician:  Lanette Hampshire, MD  Chief Complaint  Patient presents with  . Follow-up    HPI:   Peter [name] is a 64 y.o. male presenting today with a history of ETOH cirrhosis, colon cancer, up-to-date on colonoscopy surveillance with next due in Feb 2017. Last EGD done 08/06/12 and showed non-critical Schatzki's ring, grade 1 esophageal varices, mild erosive esophagitis, hiatal hernia, portal gastropathy, and gastric erythema which was biopsied. Both the stomach and esophageal biopsies were benign.  Lasix 20 mg daily an Aldactone 50 mg daily. Not able to tolerate propanolol to 10 mg BID. Difficulty with hypotension. Takes sporadically. Cardiology saw patient due to tachycardia and felt this was likely due to underlying chronic conditions. Consideration raised for backing off on diuretics to lower dose of aldactone alone.  Eating watermelon. Not much of an appetite. Doing carnation breakfast. Gets stuck on food like pickles. Refractory reflux. Nexium once daily. Zantac 150 BID. Lack of appetite. Has heaving spells. No dysphagia. No melena. Titrating lactulose as needed. Confused this past week.   March 2015 weighed in the 160s, now 148.  Past Medical History  Diagnosis Date  . Gout   . GERD (gastroesophageal reflux disease)   . Cirrhosis     ETOH related, AFP 02/05/12 = 4.9, CT on 10/16/11, no Hep A/B vaccines  . Colon cancer Dec 2011    Cecum  . ARF (acute renal failure)     Hospitalization Jan 2012  . H/O ETOH abuse   . S/P colonoscopy Dec 2011    Cecal mass, tubulovillous adenoma at splenic flexure  . S/P endoscopy Dec 2011    Schatzki's ring, Grade 1 esophageal varices, antral/body erosions  . Anemia of chronic disease 08/10/2010  . Renal insufficiency 10/24/2010  . Schatzki's ring   . Varices, esophageal   . Gastroparesis 01/2013    Past Surgical History  Procedure Laterality Date  . Colon surgery  02/24/2010   colon cancer (cecum)  . Exploratory laparotomy  03/03/2010    anastomotic leak, developed EC fistula  . Exploratory laparotomy w/ bowel resection  04/18/2010    ileostomy placed, (hx of EC fistula, anastomotic leak). About two feet of ileum removed.  . Colostomy    . Percutaneous drainage of intraabdominal abscess  07/2010 and 09/2010  . Partial colectomy  10/30/2010    Procedure: PARTIAL COLECTOMY;  Surgeon: Jamesetta So;  Location: [facility];  Service: General;  Laterality: N/A;  . Ileostomy closure  10/30/2010    Procedure: ILEOSTOMY TAKEDOWN;  Surgeon: Jamesetta So;  Location: [facility];  Service: General;  Laterality: N/A;  . Colonoscopy  Dec 2011    cecal mass, tubulovillous adenoma at splenic flexure  . Esophagogastroduodenoscopy  Dec 2011    Schatzki's ring, Grade 1 esophageal varices, antral/body erosions  . Colonoscopy N/A 04/02/2012    PPJ:[id number] residual rectal and colonic mucosa. Next colonoscopy in 03/2015.  Marland Kitchen Esophagogastroduodenoscopy N/A 08/06/2012    RMR: Grade 1 esophageal varices. Abnormal distal esophageal mucosa-3 short segment Barrett's-like appearance but negative biopsy for barrett's. mild erosive reflux esophagitis, non-critical Schatzki's ring, hiatal hernia, portal gastropathy.   . Esophageal banding N/A 08/06/2012    no banding   . Wound exploration N/A 02/25/2013    Procedure: EXCISION OF SUTURE GRANULOMA ABDOMINAL WALL;  Surgeon: Jamesetta So, MD;  Location: [facility];  Service: General;  Laterality: N/A;  . Prostate biopsy  08/14/13    Current  Outpatient Prescriptions  Medication Sig Dispense Refill  . ALPRAZolam (XANAX) 0.5 MG tablet every 4 (four) hours as needed for anxiety.   3  . diphenoxylate-atropine (LOMOTIL) 2.5-0.025 MG per tablet   1  . furosemide (LASIX) 20 MG tablet Take 1 tablet (20 mg total) by mouth daily. Along with aldactone 50 mg daily 30 tablet 5  . lactulose (CHRONULAC) 10 GM/15ML solution Take 15 mLs (10 g total) by mouth daily as needed for mild  constipation. Goal of 3 soft bowel movements a day 240 mL 3  . megestrol ([name]) 400 MG/10ML suspension Take 20 mLs (800 mg total) by mouth daily. 240 mL 3  . Pancrelipase, Lip-Prot-Amyl, (CREON) 24000 UNITS CPEP Take 1 capsule (24,000 Units total) by mouth 3 (three) times daily with meals. 180 capsule 6  . promethazine (PHENERGAN) 25 MG tablet Take 1 tablet (25 mg total) by mouth every 6 (six) hours as needed for nausea or vomiting. 15 tablet 0  . propranolol (INDERAL) 20 MG tablet Take 1 tablet (20 mg total) by mouth 2 (two) times daily. 60 tablet 3  . ranitidine (ZANTAC) 150 MG tablet Take 150 mg by mouth 2 (two) times daily.    Marland Kitchen spironolactone (ALDACTONE) 50 MG tablet Take 2 tablets (100 mg total) by mouth daily. 60 tablet 3  . tamsulosin (FLOMAX) 0.4 MG CAPS capsule Take 0.4 mg by mouth.    . traMADol (ULTRAM) 50 MG tablet Take 1 or 2 tablets up to every 6 hours as needed for pain 30 tablet 5   No current facility-administered medications for this visit.    Allergies as of 06/14/2014 - Review Complete 06/14/2014  Allergen Reaction Noted  . Ace inhibitors  02/25/2013  . Lorazepam Other (See Comments) 08/01/2010  . Percocet [oxycodone-acetaminophen] Itching and Nausea Only 08/01/2010    Family History  Problem Relation Age of Onset  . Cirrhosis Father     Deceased, secondary to ETOH  . Colon cancer Neg Hx     History   Social History  . Marital Status: Married    Spouse Name: N/A  . Number of Children: N/A  . Years of Education: N/A   Social History Main Topics  . Smoking status: Never Smoker   . Smokeless tobacco: Current User    Types: Chew     Comment: Never smoked/ chews tobacco some  . Alcohol Use: 1.2 oz/week    2 Shots of liquor per week     Comment: Daily  . Drug Use: No  . Sexual Activity: Yes    Birth Control/ Protection: None   Other Topics Concern  . None   Social History Narrative    Review of Systems: As mentioned in HPI  Physical Exam: BP  112/70 mmHg  Pulse 109  Temp(Src) 98.3 F (36.8 C) (Oral)  Ht 5\' 10"  (1.778 m)  Wt 148 lb 3.2 oz (67.223 kg)  BMI 21.26 kg/m2 General:   Alert and oriented. No distress noted. Appears chronically ill  Head:  Normocephalic and atraumatic. Eyes:  Mild scleral icterus Mouth:  Oral mucosa pink and moist.  Neck:  Supple, without mass or thyromegaly. Heart:  S1, S2 present with mild tachycardia Abdomen:  +BS, soft, non-tender and non-distended. No rebound or guarding. hepatomegaly Msk:  Symmetrical without gross deformities. Normal posture. Extremities:  Pedal edema  Neurologic:  Alert and  oriented x4;  grossly normal neurologically. Skin:  Intact without significant lesions or rashes. Psych:  Alert and cooperative. Normal mood and affect.

## 2014-07-02 NOTE — Discharge Instructions (Signed)
EGD Discharge instructions Please read the instructions outlined below and refer to this sheet in the next few weeks. These discharge instructions provide you with general information on caring for yourself after you leave the hospital. Your doctor may also give you specific instructions. While your treatment has been planned according to the most current medical practices available, unavoidable complications occasionally occur. If you have any problems or questions after discharge, please call your doctor. ACTIVITY  You may resume your regular activity but move at a slower pace for the next 24 hours.   Take frequent rest periods for the next 24 hours.   Walking will help expel (get rid of) the air and reduce the bloated feeling in your abdomen.   No driving for 24 hours (because of the anesthesia (medicine) used during the test).   You may shower.   Do not sign any important legal documents or operate any machinery for 24 hours (because of the anesthesia used during the test).  NUTRITION  Drink plenty of fluids.   You may resume your normal diet.   Begin with a light meal and progress to your normal diet.   Avoid alcoholic beverages for 24 hours or as instructed by your caregiver.  MEDICATIONS  You may resume your normal medications unless your caregiver tells you otherwise.  WHAT YOU CAN EXPECT TODAY  You may experience abdominal discomfort such as a feeling of fullness or gas pains.  FOLLOW-UP  Your doctor will discuss the results of your test with you.  SEEK IMMEDIATE MEDICAL ATTENTION IF ANY OF THE FOLLOWING OCCUR:  Excessive nausea (feeling sick to your stomach) and/or vomiting.   Severe abdominal pain and distention (swelling).   Trouble swallowing.   Temperature over 101 F (37.8 C).   Rectal bleeding or vomiting of blood.     Basic metabolic profile today    Drawn at 1140am, 07/02/2014  Repeat EGD in 2 years.

## 2014-07-02 NOTE — Op Note (Signed)
[facility] [address] [place], [address]   ENDOSCOPY PROCEDURE REPORT  PATIENT: Peter [name], Peter [name]  MR#: [id number] BIRTHDATE: [birth year] , 78  yrs. old GENDER: male ENDOSCOPIST: R.  Garfield Cornea, MD FACP FACG REFERRED BY:  Marjean Donna, M.D. PROCEDURE DATE:  2014-07-04 PROCEDURE:  EGD, diagnostic INDICATIONS:  Surveillance examination; history of esophageal varices. MEDICATIONS: Versed 2 mg IV and Demerol 25 mg IV.  Xylocaine gel orally.  Zofran 4 mg IV and Phenergan 12.5 mg IV. ASA CLASS:      Class III  CONSENT: The risks, benefits, limitations, alternatives and imponderables have been discussed.  The potential for biopsy, esophogeal dilation, etc. have also been reviewed.  Questions have been answered.  All parties agreeable.  Please see the history and physical in the medical record for more information.  DESCRIPTION OF PROCEDURE: After the risks benefits and alternatives of the procedure were thoroughly explained, informed consent was obtained.  The EG-[id number] ([id number]) endoscope was introduced through the mouth and advanced to the second portion of the duodenum , limited by Without limitations. The instrument was slowly withdrawn as the mucosa was fully examined.    3 columns of no more than grade 1 esophageal varices distal esophagus; otherwise, the esophageal mucosa appeared normal. Stomach empty.  Diffuse Fisk-scale or snakeskin appearance of the gastric mucosa with diffuse friability consistent with portal gastropathy.  No ulcer or infiltrating process.  Small hiatal hernia.  Patent pylorus.  Normal-appearing first and second portion of the duodenum.  Retroflexed views revealed a hiatal hernia. The scope was then withdrawn from the patient and the procedure completed.  COMPLICATIONS: There were no immediate complications.  ENDOSCOPIC IMPRESSION: Grade 1 esophageal varices. Hiatal hernia. Portal gastropathy.   RECOMMENDATIONS: basic  metabolic profile today?"family request;  was not done through the office as of yet.   Repeat EGD in 2 years.  REPEAT EXAM:  eSigned:  R. Garfield Cornea, MD Rosalita Chessman [facility] 2014/07/04 11:15 AM    CC:  CPT CODES: ICD CODES:  The ICD and CPT codes recommended by this software are interpretations from the data that the clinical staff has captured with the software.  The verification of the translation of this report to the ICD and CPT codes and modifiers is the sole responsibility of the health care institution and practicing physician where this report was generated.  [employer] will not be held responsible for the validity of the ICD and CPT codes included on this report.  AMA assumes no liability for data contained or not contained herein. CPT is a Designer, television/film set of the [employer].

## 2014-07-02 NOTE — Interval H&P Note (Signed)
History and Physical Interval Note:  07/02/2014 11:00 AM  Madison Hickman  has presented today for surgery, with the diagnosis of satiety, variceal surveillance  The various methods of treatment have been discussed with the patient and family. After consideration of risks, benefits and other options for treatment, the patient has consented to  Procedure(s) with comments: ESOPHAGOGASTRODUODENOSCOPY (EGD) (N/A) - 1145am - moved to 11:15 - office to notify as a surgical intervention .  The patient's history has been reviewed, patient examined, no change in status, stable for surgery.  I have reviewed the patient's chart and labs.  Questions were answered to the patient's satisfaction.     No change. Surveillance EGD per plan.The risks, benefits, limitations, alternatives and imponderables have been reviewed with the patient. Potential for esophageal dilation, biopsy, etc. have also been reviewed.  Questions have been answered. All parties agreeable.  Manus Rudd

## 2014-07-05 MED ORDER — DIPHENOXYLATE-ATROPINE 2.5-0.025 MG PO TABS: 1.0000 | ORAL_TABLET | Freq: Four times a day (QID) | ORAL | Status: AC | PRN

## 2014-07-05 MED ORDER — ESOMEPRAZOLE MAGNESIUM 40 MG PO CPDR: 40.0000 mg | DELAYED_RELEASE_CAPSULE | Freq: Two times a day (BID) | ORAL | Status: AC

## 2014-07-06 ENCOUNTER — Encounter (HOSPITAL_COMMUNITY): Payer: Self-pay | Admitting: Internal Medicine

## 2014-07-20 ENCOUNTER — Telehealth: Payer: Self-pay | Admitting: Gastroenterology

## 2014-07-20 NOTE — Telephone Encounter (Signed)
I contacted patient's wife at home to set up a routine follow-up appt. There is some concern for possible scant hematochezia. Next colonoscopy due in 2017, but with his persistent fatigue, possible hematochezia, I would like to evaluate for a possible early interval colonoscopy. Hx of colon cancer.   Please mail an ifobt kit to patient. I discussed this with his wife. They will bring back with him.  Please arrange appt with me on June 21st at 2pm. Thanks!

## 2014-07-21 NOTE — Telephone Encounter (Signed)
ifobt in the mail to the pt.  Peter [name], please schedule ov.

## 2014-07-23 NOTE — Telephone Encounter (Signed)
Sorry AS I just seen this. Can we put patient in at 1130 on June 21? The 2pm has been filled due to rescheduling patients.

## 2014-07-23 NOTE — Telephone Encounter (Signed)
He can only do afternoons. Can we keep him there and put the other patient in at 11:30?

## 2014-07-26 ENCOUNTER — Other Ambulatory Visit: Payer: Self-pay

## 2014-07-26 ENCOUNTER — Telehealth: Payer: Self-pay

## 2014-07-26 DIAGNOSIS — K7460 Unspecified cirrhosis of liver: Secondary | ICD-10-CM

## 2014-07-26 NOTE — Telephone Encounter (Signed)
Pt's wife is calling because he is itches really bad. She is wanting to know if he can take some benadryl or have something called in to [facility] in [place]. Please advise

## 2014-07-26 NOTE — Telephone Encounter (Signed)
She is calling back and wanting to know if she can use Menphor lotion for him to use.

## 2014-07-26 NOTE — Telephone Encounter (Signed)
LFT order faxed to [facility] @ [phone]

## 2014-07-26 NOTE — Telephone Encounter (Signed)
Spoke with patient. Asked her to hold off on using benadryl for patient due to sedating effects. May use the topical lotion.   Patient will come see me [name]. Wife is aware. Ginger, please schedule in the urgent slot.   [name]: can we fax over an order for LFTs to the cancer center to have done that morning when he has blood work? Thanks!  [name]

## 2014-07-26 NOTE — Telephone Encounter (Signed)
Done

## 2014-07-27 NOTE — Telephone Encounter (Signed)
Patient has an urgent ov to see AS tomorrow 6/8 at 230

## 2014-07-27 NOTE — Telephone Encounter (Signed)
Per Ginger patient's wife is aware of his appt

## 2014-07-28 ENCOUNTER — Ambulatory Visit: Payer: Medicaid Other | Admitting: Gastroenterology

## 2014-07-28 ENCOUNTER — Encounter (HOSPITAL_COMMUNITY): Payer: Medicaid Other | Attending: Hematology and Oncology | Admitting: Hematology & Oncology

## 2014-07-28 ENCOUNTER — Encounter (HOSPITAL_COMMUNITY): Payer: Self-pay | Admitting: Hematology & Oncology

## 2014-07-28 ENCOUNTER — Encounter (HOSPITAL_COMMUNITY): Payer: Medicaid Other

## 2014-07-28 VITALS — BP 92/53 | HR 112 | Temp 98.7°F | Resp 16

## 2014-07-28 DIAGNOSIS — K7030 Alcoholic cirrhosis of liver without ascites: Secondary | ICD-10-CM | POA: Diagnosis not present

## 2014-07-28 DIAGNOSIS — C20 Malignant neoplasm of rectum: Secondary | ICD-10-CM | POA: Diagnosis not present

## 2014-07-28 DIAGNOSIS — C61 Malignant neoplasm of prostate: Secondary | ICD-10-CM | POA: Diagnosis not present

## 2014-07-28 DIAGNOSIS — D731 Hypersplenism: Secondary | ICD-10-CM | POA: Insufficient documentation

## 2014-07-28 DIAGNOSIS — N62 Hypertrophy of breast: Secondary | ICD-10-CM

## 2014-07-28 DIAGNOSIS — R410 Disorientation, unspecified: Secondary | ICD-10-CM

## 2014-07-28 DIAGNOSIS — R58 Hemorrhage, not elsewhere classified: Secondary | ICD-10-CM

## 2014-07-28 DIAGNOSIS — K7031 Alcoholic cirrhosis of liver with ascites: Secondary | ICD-10-CM

## 2014-07-28 DIAGNOSIS — C189 Malignant neoplasm of colon, unspecified: Secondary | ICD-10-CM

## 2014-07-28 DIAGNOSIS — D61818 Other pancytopenia: Secondary | ICD-10-CM | POA: Diagnosis not present

## 2014-07-28 DIAGNOSIS — F1010 Alcohol abuse, uncomplicated: Secondary | ICD-10-CM

## 2014-07-28 DIAGNOSIS — K7460 Unspecified cirrhosis of liver: Secondary | ICD-10-CM | POA: Insufficient documentation

## 2014-07-28 DIAGNOSIS — R17 Unspecified jaundice: Secondary | ICD-10-CM

## 2014-07-28 DIAGNOSIS — W19XXXS Unspecified fall, sequela: Secondary | ICD-10-CM

## 2014-07-28 LAB — COMPREHENSIVE METABOLIC PANEL
ALBUMIN: 2.2 g/dL — AB (ref 3.5–5.0)
ALK PHOS: 91 U/L (ref 38–126)
ALT: 24 U/L (ref 17–63)
ANION GAP: 12 (ref 5–15)
AST: 79 U/L — ABNORMAL HIGH (ref 15–41)
BUN: 16 mg/dL (ref 6–20)
CHLORIDE: 99 mmol/L — AB (ref 101–111)
CO2: 23 mmol/L (ref 22–32)
CREATININE: 1.79 mg/dL — AB (ref 0.61–1.24)
Calcium: 7.7 mg/dL — ABNORMAL LOW (ref 8.9–10.3)
GFR calc Af Amer: 45 mL/min — ABNORMAL LOW (ref >60)
GFR, EST NON AFRICAN AMERICAN: 39 mL/min — AB (ref >60)
Glucose, Bld: 92 mg/dL (ref 65–99)
Potassium: 4.2 mmol/L (ref 3.5–5.1)
SODIUM: 134 mmol/L — AB (ref 135–145)
Total Bilirubin: 6.7 mg/dL — ABNORMAL HIGH (ref 0.3–1.2)
Total Protein: 5.9 g/dL — ABNORMAL LOW (ref 6.5–8.1)

## 2014-07-28 LAB — CBC WITH DIFFERENTIAL/PLATELET
BASOS ABS: 0 10*3/uL (ref 0.0–0.1)
BASOS PCT: 0 % (ref 0–1)
Eosinophils Absolute: 0.2 10*3/uL (ref 0.0–0.7)
Eosinophils Relative: 3 % (ref 0–5)
HEMATOCRIT: 21.8 % — AB (ref 39.0–52.0)
Hemoglobin: 7.6 g/dL — ABNORMAL LOW (ref 13.0–17.0)
Lymphocytes Relative: 21 % (ref 12–46)
Lymphs Abs: 1.4 10*3/uL (ref 0.7–4.0)
MCH: 40.9 pg — AB (ref 26.0–34.0)
MCHC: 34.9 g/dL (ref 30.0–36.0)
MCV: 117.2 fL — AB (ref 78.0–100.0)
MONO ABS: 1 10*3/uL (ref 0.1–1.0)
Monocytes Relative: 15 % — ABNORMAL HIGH (ref 3–12)
NEUTROS ABS: 4.2 10*3/uL (ref 1.7–7.7)
Neutrophils Relative %: 62 % (ref 43–77)
PLATELETS: 92 10*3/uL — AB (ref 150–400)
RBC: 1.86 MIL/uL — AB (ref 4.22–5.81)
RDW: 17.5 % — ABNORMAL HIGH (ref 11.5–15.5)
WBC: 6.7 10*3/uL (ref 4.0–10.5)

## 2014-07-28 NOTE — Patient Instructions (Signed)
..  [place] at [facility] Discharge Instructions  RECOMMENDATIONS MADE BY THE CONSULTANT AND ANY TEST RESULTS WILL BE SENT TO YOUR REFERRING PHYSICIAN.  Labs today  We will talk to palliative care about taking care of you at home  They will call to set up an appointment  Thank you for choosing [place] at [facility] to provide your oncology and hematology care.  To afford each patient quality time with our provider, please arrive at least 15 minutes before your scheduled appointment time.    You need to re-schedule your appointment should you arrive 10 or more minutes late.  We strive to give you quality time with our providers, and arriving late affects you and other patients whose appointments are after yours.  Also, if you no show three or more times for appointments you may be dismissed from the clinic at the providers discretion.     Again, thank you for choosing [facility].  Our hope is that these requests will decrease the amount of time that you wait before being seen by our physicians.       _____________________________________________________________  Should you have questions after your visit to [facility], please contact our office at [phone] between the hours of 8:30 a.m. and 4:30 p.m.  Voicemails left after 4:30 p.m. will not be returned until the following business day.  For prescription refill requests, have your pharmacy contact our office.

## 2014-07-28 NOTE — Progress Notes (Signed)
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 male returns for follow-up of multiple medical issues. 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 He has persistent elevation in his CEA which is felt to be secondary to his liver disease. He also has a history of several hepatic lesions that are stable.  He was diagnosed with prostate cancer last year but based on final pathology observation is recommended.  He presents today with 2 family members, his wife and a friend. Inside the room, he complains of back pain and wants to lay back on the examine table.  He also has a fall that occurred yesterday from trying to climb the stairs ending up in him hitting the shower door and splitting open is forearm. He would not go to the ED.   He is not doing well and does not enjoy doing anything anymore. He is mostly bedbound. He cannot get up and down the stairs without assistance, he cannot dress himself without help.   He admits to still drinking alcohol, his wife notes he will not quit. His family members state that he is more and more confused at times, he does not eat, he only gets out of bed to use the bathroom. They have brought in multiple photographs of blood in the toilet and on the floor. He bleeds from old  surgical sites on his abdomen.   They are curious about DNR. His wife "knows he is dying."  MEDICAL HISTORY: Past Medical History  Diagnosis Date  . Gout   . GERD (gastroesophageal reflux disease)   . Cirrhosis     ETOH related, AFP 02/05/12 = 4.9, CT on 10/16/11, no Hep A/B vaccines  . Colon cancer Dec 2011    Cecum  . ARF (acute renal failure)     Hospitalization Jan 2012  . H/O ETOH abuse   . S/P colonoscopy Dec 2011    Cecal mass, tubulovillous adenoma at splenic flexure  . S/P endoscopy Dec 2011    Schatzki's ring, Grade 1 esophageal varices, antral/body erosions  . Anemia of chronic disease 08/10/2010  . Renal insufficiency 10/24/2010  . Schatzki's ring   . Varices, esophageal   . Gastroparesis 50/2774    has Alcoholic cirrhosis of liver; EPIGASTRIC PAIN; TRANSAMINASES, SERUM, ELEVATED; Personal history of colon cancer; Dehydration; Colon cancer; Anemia of chronic disease; Diarrhea; Weight loss, abnormal; Renal insufficiency; Retroperitoneal lymphadenopathy; Esophageal varices without bleeding; Chronic diarrhea; GERD (gastroesophageal reflux disease); Nausea alone; Hypersplenism syndrome; Thrombocytopenia due to hypersplenism; Elevated PSA; Prostate cancer; Breast mass, left; Tachycardia; Esophageal varices; Portal hypertensive gastropathy; and Hiatal hernia on his problem list.     is allergic to ace inhibitors; lorazepam; and percocet.  Peter [name] does not currently have medications on file.  SURGICAL HISTORY: Past Surgical History  Procedure Laterality Date  .  Colon surgery  02/24/2010    colon cancer (cecum)  . Exploratory laparotomy  03/03/2010    anastomotic leak, developed EC fistula  . Exploratory laparotomy w/ bowel resection  04/18/2010    ileostomy placed, (hx of EC fistula, anastomotic leak). About two feet of ileum removed.  . Colostomy    . Percutaneous drainage of intraabdominal abscess  07/2010 and 09/2010  . Partial colectomy  10/30/2010    Procedure:  PARTIAL COLECTOMY;  Surgeon: Jamesetta So;  Location: [facility];  Service: General;  Laterality: N/A;  . Ileostomy closure  10/30/2010    Procedure: ILEOSTOMY TAKEDOWN;  Surgeon: Jamesetta So;  Location: [facility];  Service: General;  Laterality: N/A;  . Colonoscopy  Dec 2011    cecal mass, tubulovillous adenoma at splenic flexure  . Esophagogastroduodenoscopy  Dec 2011    Schatzki's ring, Grade 1 esophageal varices, antral/body erosions  . Colonoscopy N/A 04/02/2012    WUJ:[id number] residual rectal and colonic mucosa. Next colonoscopy in 03/2015.  Marland Kitchen Esophagogastroduodenoscopy N/A 08/06/2012    RMR: Grade 1 esophageal varices. Abnormal distal esophageal mucosa-3 short segment Barrett's-like appearance but negative biopsy for barrett's. mild erosive reflux esophagitis, non-critical Schatzki's ring, hiatal hernia, portal gastropathy.   . Esophageal banding N/A 08/06/2012    no banding   . Wound exploration N/A 02/25/2013    Procedure: EXCISION OF SUTURE GRANULOMA ABDOMINAL WALL;  Surgeon: Jamesetta So, MD;  Location: [facility];  Service: General;  Laterality: N/A;  . Prostate biopsy  08/14/13  . Esophagogastroduodenoscopy N/A 07/02/2014    RMR: Grade 1 esophageal varices. Hiatal hernia. Portal gastropathy    SOCIAL HISTORY: History   Social History  . Marital Status: Married    Spouse Name: N/A  . Number of Children: N/A  . Years of Education: N/A   Occupational History  . Not on file.   Social History Main Topics  . Smoking status: Never Smoker   . Smokeless tobacco: Current User    Types: Chew     Comment: Never smoked/ chews tobacco some  . Alcohol Use: 1.2 oz/week    2 Shots of liquor per week     Comment: Daily  . Drug Use: No  . Sexual Activity: Yes    Birth Control/ Protection: None   Other Topics Concern  . Not on file   Social History Narrative    FAMILY HISTORY: Family History  Problem Relation Age of Onset  . Cirrhosis Father     Deceased, secondary to ETOH  . Colon  cancer Neg Hx     Review of Systems  Constitutional: Positive for malaise/fatigue.  HENT: Negative.   Eyes: Negative.   Respiratory: Negative.   Cardiovascular: Negative.   Gastrointestinal: Positive for heartburn.       Abominal bloating  Genitourinary: Negative.   Musculoskeletal: Positive for myalgias, leg swelling, back pain, and joint pain Skin: Negative.   Neurological: Positive for weakness, confusion. FALLS Endo/Heme/Allergies: Oozing from old surgical sites  Psychiatric/Behavioral: Negative.     PHYSICAL EXAMINATION  ECOG PERFORMANCE STATUS: 3 - Symptomatic, >50% confined to bed  Filed Vitals:   07/28/14 1343  BP: 92/53  Pulse: 112  Temp: 98.7 F (37.1 C)  Resp: 16    Physical Exam  Constitutional: He is poorly oriented today. At times is aware but mostly sleeping. Icteric, thin, wasting HENT:  Head: Normocephalic and atraumatic.  Nose: Nose normal.  Mouth/Throat: Oropharynx is clear and moist. No oropharyngeal exudate.  Eyes: Conjunctivae and EOM  are normal. Pupils are equal, round, and reactive to light. Right eye exhibits no discharge. Left eye exhibits no discharge. scleral icterus.  very Jaundiced Neck: Normal range of motion. Neck supple. No tracheal deviation present. No thyromegaly present.  Cardiovascular: Normal rate, regular rhythm and normal heart sounds.  Exam reveals no gallop and no friction rub.   No murmur heard. Pulmonary/Chest: Effort normal and breath sounds normal. He has no wheezes. He has no rales.  Bilateral gynecomastia, symmetrical  Abdominal: Soft. Bowel sounds are normal. He exhibits distension. He exhibits no mass. Multiple 4x4's are over his prior midline surgical incision site secondary to oozing. Musculoskeletal:Edema in ankles.  Lymphadenopathy:    He has no cervical adenopathy.  Skin: Skin is warm and dry. Multiple echymoses  Upper anterior chest very bruised, left elbow split open but bandaged, bandaged sites of old  incisions on abdomen  Psychiatric: Confusedl.  Nursing note and vitals reviewed.   LABORATORY DATA:  CBC    Component Value Date/Time   WBC 6.7 07/28/2014 1500   RBC 1.86* 07/28/2014 1500   RBC 4.07* 09/04/2010 1400   HGB 7.6* 07/28/2014 1500   HCT 21.8* 07/28/2014 1500   PLT 92* 07/28/2014 1500   MCV 117.2* 07/28/2014 1500   MCH 40.9* 07/28/2014 1500   MCHC 34.9 07/28/2014 1500   RDW 17.5* 07/28/2014 1500   LYMPHSABS 1.4 07/28/2014 1500   MONOABS 1.0 07/28/2014 1500   EOSABS 0.2 07/28/2014 1500   BASOSABS 0.0 07/28/2014 1500   CMP     Component Value Date/Time   NA 134* 07/28/2014 1500   K 4.2 07/28/2014 1500   CL 99* 07/28/2014 1500   CO2 23 07/28/2014 1500   GLUCOSE 92 07/28/2014 1500   BUN 16 07/28/2014 1500   CREATININE 1.79* 07/28/2014 1500   CREATININE 1.32 06/14/2014 1228   CALCIUM 7.7* 07/28/2014 1500   PROT 5.9* 07/28/2014 1500   ALBUMIN 2.2* 07/28/2014 1500   AST 79* 07/28/2014 1500   ALT 24 07/28/2014 1500   ALKPHOS 91 07/28/2014 1500   BILITOT 6.7* 07/28/2014 1500   GFRNONAA 39* 07/28/2014 1500   GFRAA 45* 07/28/2014 1500     ASSESSMENT and THERAPY PLAN:   Pancyopenia Macrocytic anemia with normal B12 and folate in the distant past Gynecomastia History of Stage I CRC ETOH induced cirrhosis Rapid decline in PS Confusion  We spent time in discussion regarding his significant and rapid decline. His wife is very tearful but notes that his decline is obvious. She had questions about DO NOT RESUSCITATE status. The patient was intermittently involved in our conversation but was too confused and fatigued to participate.   We discussed reasonable goals for his care. We discussed hospice in detail. She is extremely interested in hospice as she does not feel able to manage him alone. She needs a hospital bed on the first floor as he is no longer able to get up and down steps. After an ongoing discussion we talked about a hospice referral, referral was  made today.   I advised her if she has additional problems or concerns to let us know the plan today is for hospice care.   All questions were answered. The patient knows to call the clinic with any problems, questions or concerns. We can certainly see the patient much sooner if necessary. This note was electronically signed.  This document serves as a record of services personally performed by Ancil Linsey, MD. It was created on her behalf by Arlyce Harman, a  trained medical scribe. The creation of this record is based on the scribe's personal observations and the provider's statements to them. This document has been checked and approved by the attending provider.  I have reviewed the above documentation for accuracy and completeness, and I agree with the above.  Molli Hazard, MD

## 2014-07-29 ENCOUNTER — Encounter (HOSPITAL_COMMUNITY): Payer: Self-pay | Admitting: Hematology & Oncology

## 2014-07-29 LAB — CEA: CEA: 19.3 ng/mL — ABNORMAL HIGH (ref 0.0–4.7)

## 2014-09-20 DEATH — deceased
